# Patient Record
Sex: Female | Born: 1995 | Race: Black or African American | Hispanic: No | Marital: Single | Smoking: Never smoker
Health system: Southern US, Community
[De-identification: ages and names within clinical notes are randomized; demographics above are authoritative.]

## PROBLEM LIST (undated history)

## (undated) DIAGNOSIS — R519 Headache, unspecified: Secondary | ICD-10-CM

## (undated) DIAGNOSIS — Z9889 Other specified postprocedural states: Secondary | ICD-10-CM

## (undated) DIAGNOSIS — O4290 Premature rupture of membranes, unspecified as to length of time between rupture and onset of labor, unspecified weeks of gestation: Secondary | ICD-10-CM

## (undated) DIAGNOSIS — O99210 Obesity complicating pregnancy, unspecified trimester: Secondary | ICD-10-CM

## (undated) DIAGNOSIS — R112 Nausea with vomiting, unspecified: Secondary | ICD-10-CM

## (undated) DIAGNOSIS — D649 Anemia, unspecified: Secondary | ICD-10-CM

## (undated) DIAGNOSIS — R7303 Prediabetes: Secondary | ICD-10-CM

## (undated) DIAGNOSIS — F32A Depression, unspecified: Secondary | ICD-10-CM

## (undated) DIAGNOSIS — F419 Anxiety disorder, unspecified: Secondary | ICD-10-CM

## (undated) DIAGNOSIS — Z789 Other specified health status: Secondary | ICD-10-CM

## (undated) DIAGNOSIS — O26899 Other specified pregnancy related conditions, unspecified trimester: Secondary | ICD-10-CM

## (undated) HISTORY — PX: OTHER SURGICAL HISTORY: SHX169

## (undated) HISTORY — DX: Other specified health status: Z78.9

## (undated) HISTORY — PX: WISDOM TOOTH EXTRACTION: SHX21

## (undated) HISTORY — PX: ROOT CANAL: SHX2363

---
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
## 2017-09-04 DIAGNOSIS — N760 Acute vaginitis: Secondary | ICD-10-CM | POA: Insufficient documentation

## 2017-12-01 DIAGNOSIS — B3731 Acute candidiasis of vulva and vagina: Secondary | ICD-10-CM | POA: Insufficient documentation

## 2017-12-18 DIAGNOSIS — Z202 Contact with and (suspected) exposure to infections with a predominantly sexual mode of transmission: Secondary | ICD-10-CM | POA: Insufficient documentation

## 2019-09-19 ENCOUNTER — Encounter: Payer: Self-pay | Admitting: Certified Nurse Midwife

## 2019-09-19 ENCOUNTER — Ambulatory Visit: Payer: BC Managed Care – PPO | Admitting: Certified Nurse Midwife

## 2019-09-19 ENCOUNTER — Other Ambulatory Visit: Payer: Self-pay

## 2019-09-19 VITALS — BP 123/81 | HR 85 | Ht 64.0 in | Wt 224.3 lb

## 2019-09-19 DIAGNOSIS — Z6838 Body mass index (BMI) 38.0-38.9, adult: Secondary | ICD-10-CM

## 2019-09-19 DIAGNOSIS — Z3141 Encounter for fertility testing: Secondary | ICD-10-CM | POA: Diagnosis not present

## 2019-09-19 NOTE — Patient Instructions (Addendum)
Exercising to Stay Healthy To become healthy and stay healthy, it is recommended that you do moderate-intensity and vigorous-intensity exercise. You can tell that you are exercising at a moderate intensity if your heart starts beating faster and you start breathing faster but can still hold a conversation. You can tell that you are exercising at a vigorous intensity if you are breathing much harder and faster and cannot hold a conversation while exercising. Exercising regularly is important. It has many health benefits, such as:  Improving overall fitness, flexibility, and endurance.  Increasing bone density.  Helping with weight control.  Decreasing body fat.  Increasing muscle strength.  Reducing stress and tension.  Improving overall health. How often should I exercise? Choose an activity that you enjoy, and set realistic goals. Your health care provider can help you make an activity plan that works for you. Exercise regularly as told by your health care provider. This may include:  Doing strength training two times a week, such as: ? Lifting weights. ? Using resistance bands. ? Push-ups. ? Sit-ups. ? Yoga.  Doing a certain intensity of exercise for a given amount of time. Choose from these options: ? A total of 150 minutes of moderate-intensity exercise every week. ? A total of 75 minutes of vigorous-intensity exercise every week. ? A mix of moderate-intensity and vigorous-intensity exercise every week. Children, pregnant women, people who have not exercised regularly, people who are overweight, and older adults may need to talk with a health care provider about what activities are safe to do. If you have a medical condition, be sure to talk with your health care provider before you start a new exercise program. What are some exercise ideas? Moderate-intensity exercise ideas include:  Walking 1 mile (1.6 km) in about 15  minutes.  Biking.  Hiking.  Golfing.  Dancing.  Water aerobics. Vigorous-intensity exercise ideas include:  Walking 4.5 miles (7.2 km) or more in about 1 hour.  Jogging or running 5 miles (8 km) in about 1 hour.  Biking 10 miles (16.1 km) or more in about 1 hour.  Lap swimming.  Roller-skating or in-line skating.  Cross-country skiing.  Vigorous competitive sports, such as football, basketball, and soccer.  Jumping rope.  Aerobic dancing. What are some everyday activities that can help me to get exercise?  Yard work, such as: ? Pushing a lawn mower. ? Raking and bagging leaves.  Washing your car.  Pushing a stroller.  Shoveling snow.  Gardening.  Washing windows or floors. How can I be more active in my day-to-day activities?  Use stairs instead of an elevator.  Take a walk during your lunch break.  If you drive, park your car farther away from your work or school.  If you take public transportation, get off one stop early and walk the rest of the way.  Stand up or walk around during all of your indoor phone calls.  Get up, stretch, and walk around every 30 minutes throughout the day.  Enjoy exercise with a friend. Support to continue exercising will help you keep a regular routine of activity. What guidelines can I follow while exercising?  Before you start a new exercise program, talk with your health care provider.  Do not exercise so much that you hurt yourself, feel dizzy, or get very short of breath.  Wear comfortable clothes and wear shoes with good support.  Drink plenty of water while you exercise to prevent dehydration or heat stroke.  Work out until your breathing   and your heartbeat get faster. Where to find more information  U.S. Department of Health and Human Services: [employer].at  Centers for Disease Control and Prevention (CDC): http://www.wolf.info/ Summary  Exercising regularly is important. It will improve your overall fitness,  flexibility, and endurance.  Regular exercise also will improve your overall health. It can help you control your weight, reduce stress, and improve your bone density.  Do not exercise so much that you hurt yourself, feel dizzy, or get very short of breath.  Before you start a new exercise program, talk with your health care provider. This information is not intended to replace advice given to you by your health care provider. Make sure you discuss any questions you have with your health care provider. Document Revised: 03/23/2017 Document Reviewed: 03/01/2017 Elsevier Patient Education  2020 [employer].   Preparing for Pregnancy If you are considering becoming pregnant, make an appointment to see your regular health care provider to learn how to prepare for a safe and healthy pregnancy (preconception care). During a preconception care visit, your health care provider will:  Do a complete physical exam, including a Pap test.  Take a complete medical history.  Give you information, answer your questions, and help you resolve problems. Preconception checklist Medical history  Tell your health care provider about any current or past medical conditions. Your pregnancy or your ability to become pregnant may be affected by chronic conditions, such as diabetes, chronic hypertension, and thyroid problems.  Include your family's medical history as well as your partner's medical history.  Tell your health care provider about any history of STIs (sexually transmitted infections).These can affect your pregnancy. In some cases, they can be passed to your baby. Discuss any concerns that you have about STIs.  If indicated, discuss the benefits of genetic testing. This testing will show whether there are any genetic conditions that may be passed from you or your partner to your baby.  Tell your health care provider about: ? Any problems you have had with conception or pregnancy. ? Any medicines  you take. These include vitamins, herbal supplements, and over-the-counter medicines. ? Your history of immunizations. Discuss any vaccinations that you may need. Diet  Ask your health care provider what to include in a healthy diet that has a balance of nutrients. This is especially important when you are pregnant or preparing to become pregnant.  Ask your health care provider to help you reach a healthy weight before pregnancy. ? If you are overweight, you may be at higher risk for certain complications, such as high blood pressure, diabetes, and preterm birth. ? If you are underweight, you are more likely to have a baby who has a low birth weight. Lifestyle, work, and home  Let your health care provider know: ? About any lifestyle habits that you have, such as alcohol use, drug use, or smoking. ? About recreational activities that may put you at risk during pregnancy, such as downhill skiing and certain exercise programs. ? Tell your health care provider about any international travel, especially any travel to places with an active [place] virus outbreak. ? About harmful substances that you may be exposed to at work or at home. These include chemicals, pesticides, radiation, or even litter boxes. ? If you do not feel safe at home. Mental health  Tell your health care provider about: ? Any history of mental health conditions, including feelings of depression, sadness, or anxiety. ? Any medicines that you take for a mental health condition.  These include herbs and supplements. Home instructions to prepare for pregnancy Lifestyle   Eat a balanced diet. This includes fresh fruits and vegetables, whole grains, lean meats, low-fat dairy products, healthy fats, and foods that are high in fiber. Ask to meet with a nutritionist or registered dietitian for assistance with meal planning and goals.  Get regular exercise. Try to be active for at least 30 minutes a day on most days of the week. Ask  your health care provider which activities are safe during pregnancy.  Do not use any products that contain nicotine or tobacco, such as cigarettes and e-cigarettes. If you need help quitting, ask your health care provider.  Do not drink alcohol.  Do not take illegal drugs.  Maintain a healthy weight. Ask your health care provider what weight range is right for you. General instructions  Keep an accurate record of your menstrual periods. This makes it easier for your health care provider to determine your baby's due date.  Begin taking prenatal vitamins and folic acid supplements daily as directed by your health care provider.  Manage any chronic conditions, such as high blood pressure and diabetes, as told by your health care provider. This is important. How do I know that I am pregnant? You may be pregnant if you have been sexually active and you miss your period. Symptoms of early pregnancy include:  Mild cramping.  Very light vaginal bleeding (spotting).  Feeling unusually tired.  Nausea and vomiting (morning sickness). If you have any of these symptoms and you suspect that you might be pregnant, you can take a home pregnancy test. These tests check for a hormone in your urine (human chorionic gonadotropin, or hCG). A woman's body begins to make this hormone during early pregnancy. These tests are very accurate. Wait until at least the first day after you miss your period to take one. If the test shows that you are pregnant (you get a positive result), call your health care provider to make an appointment for prenatal care. What should I do if I become pregnant?      Make an appointment with your health care provider as soon as you suspect you are pregnant.  Do not use any products that contain nicotine, such as cigarettes, chewing tobacco, and e-cigarettes. If you need help quitting, ask your health care provider.  Do not drink alcoholic beverages. Alcohol is related to a  number of birth defects.  Avoid toxic odors and chemicals.  You may continue to have sexual intercourse if it does not cause pain or other problems, such as vaginal bleeding. This information is not intended to replace advice given to you by your health care provider. Make sure you discuss any questions you have with your health care provider. Document Revised: 04/12/2017 Document Reviewed: 10/31/2015 Elsevier Patient Education  2020 [place] Following a healthy eating pattern may help you to achieve and maintain a healthy body weight, reduce the risk of chronic disease, and live a long and productive life. It is important to follow a healthy eating pattern at an appropriate calorie level for your body. Your nutritional needs should be met primarily through food by choosing a variety of nutrient-rich foods. What are tips for following this plan? Reading food labels  Read labels and choose the following: ? Reduced or low sodium. ? Juices with 100% fruit juice. ? Foods with low saturated fats and high polyunsaturated and monounsaturated fats. ? Foods with whole grains, such as  whole wheat, cracked wheat, brown rice, and wild rice. ? Whole grains that are fortified with folic acid. This is recommended for women who are pregnant or who want to become pregnant.  Read labels and avoid the following: ? Foods with a lot of added sugars. These include foods that contain brown sugar, corn sweetener, corn syrup, dextrose, fructose, glucose, high-fructose corn syrup, honey, invert sugar, lactose, malt syrup, maltose, molasses, raw sugar, sucrose, trehalose, or turbinado sugar.  Do not eat more than the following amounts of added sugar per day:  6 teaspoons (25 g) for women.  9 teaspoons (38 g) for men. ? Foods that contain processed or refined starches and grains. ? Refined grain products, such as white flour, degermed cornmeal, white bread, and white  rice. Shopping  Choose nutrient-rich snacks, such as vegetables, whole fruits, and nuts. Avoid high-calorie and high-sugar snacks, such as potato chips, fruit snacks, and candy.  Use oil-based dressings and spreads on foods instead of solid fats such as butter, stick margarine, or cream cheese.  Limit pre-made sauces, mixes, and "instant" products such as flavored rice, instant noodles, and ready-made pasta.  Try more plant-protein sources, such as tofu, tempeh, black beans, edamame, lentils, nuts, and seeds.  Explore eating plans such as the Mediterranean diet or vegetarian diet. Cooking  Use oil to saut or stir-fry foods instead of solid fats such as butter, stick margarine, or lard.  Try baking, boiling, grilling, or broiling instead of frying.  Remove the fatty part of meats before cooking.  Steam vegetables in water or broth. Meal planning   At meals, imagine dividing your plate into fourths: ? One-half of your plate is fruits and vegetables. ? One-fourth of your plate is whole grains. ? One-fourth of your plate is protein, especially lean meats, poultry, eggs, tofu, beans, or nuts.  Include low-fat dairy as part of your daily diet. Lifestyle  Choose healthy options in all settings, including home, work, school, restaurants, or stores.  Prepare your food safely: ? Wash your hands after handling raw meats. ? Keep food preparation surfaces clean by regularly washing with hot, soapy water. ? Keep raw meats separate from ready-to-eat foods, such as fruits and vegetables. ? Cook seafood, meat, poultry, and eggs to the recommended internal temperature. ? Store foods at safe temperatures. In general:  Keep cold foods at 27F (4.4C) or below.  Keep hot foods at 127F (60C) or above.  Keep your freezer at [employer] (-17.8C) or below.  Foods are no longer safe to eat when they have been between the temperatures of 40-127F (4.4-60C) for more than 2 hours. What foods should  I eat? Fruits Aim to eat 2 cup-equivalents of fresh, canned (in natural juice), or frozen fruits each day. Examples of 1 cup-equivalent of fruit include 1 small apple, 8 large strawberries, 1 cup canned fruit,  cup dried fruit, or 1 cup 100% juice. Vegetables Aim to eat 2-3 cup-equivalents of fresh and frozen vegetables each day, including different varieties and colors. Examples of 1 cup-equivalent of vegetables include 2 medium carrots, 2 cups raw, leafy greens, 1 cup chopped vegetable (raw or cooked), or 1 medium baked potato. Grains Aim to eat 6 ounce-equivalents of whole grains each day. Examples of 1 ounce-equivalent of grains include 1 slice of bread, 1 cup ready-to-eat cereal, 3 cups popcorn, or  cup cooked rice, pasta, or cereal. Meats and other proteins Aim to eat 5-6 ounce-equivalents of protein each day. Examples of 1 ounce-equivalent of protein include 1  egg, 1/2 cup nuts or seeds, or 1 tablespoon (16 g) peanut butter. A cut of meat or fish that is the size of a deck of cards is about 3-4 ounce-equivalents.  Of the protein you eat each week, try to have at least 8 ounces come from seafood. This includes salmon, trout, herring, and anchovies. Dairy Aim to eat 3 cup-equivalents of fat-free or low-fat dairy each day. Examples of 1 cup-equivalent of dairy include 1 cup (240 mL) milk, 8 ounces (250 g) yogurt, 1 ounces (44 g) natural cheese, or 1 cup (240 mL) fortified soy milk. Fats and oils  Aim for about 5 teaspoons (21 g) per day. Choose monounsaturated fats, such as canola and olive oils, avocados, peanut butter, and most nuts, or polyunsaturated fats, such as sunflower, corn, and soybean oils, walnuts, pine nuts, sesame seeds, sunflower seeds, and flaxseed. Beverages  Aim for six 8-oz glasses of water per day. Limit coffee to three to five 8-oz cups per day.  Limit caffeinated beverages that have added calories, such as soda and energy drinks.  Limit alcohol intake to no more  than 1 drink a day for nonpregnant women and 2 drinks a day for men. One drink equals 12 oz of beer (355 mL), 5 oz of wine (148 mL), or 1 oz of hard liquor (44 mL). Seasoning and other foods  Avoid adding excess amounts of salt to your foods. Try flavoring foods with herbs and spices instead of salt.  Avoid adding sugar to foods.  Try using oil-based dressings, sauces, and spreads instead of solid fats. This information is based on general U.[place] nutrition guidelines. For more information, visit [employer].es. Exact amounts may vary based on your nutrition needs. Summary  A healthy eating plan may help you to maintain a healthy weight, reduce the risk of chronic diseases, and stay active throughout your life.  Plan your meals. Make sure you eat the right portions of a variety of nutrient-rich foods.  Try baking, boiling, grilling, or broiling instead of frying.  Choose healthy options in all settings, including home, work, school, restaurants, or stores. This information is not intended to replace advice given to you by your health care provider. Make sure you discuss any questions you have with your health care provider. Document Revised: 07/23/2017 Document Reviewed: 07/23/2017 Elsevier Patient Education  [place].

## 2019-09-19 NOTE — Progress Notes (Signed)
I spent 20 minutes dedicated to the care of this patient on the date of this encounter to include pre-visit review of records, face to face time with the patient, and post visit ordering of testing.   I have seen, interviewed, and examined the patient in conjunction with the [employer] Nurse Practitioner student and affirm the diagnosis and management plan.   Gunnar Bulla, CNM [facility], [facility] 09/19/19 11:23 AM

## 2019-09-19 NOTE — Progress Notes (Addendum)
GYN ENCOUNTER NOTE  Subjective:       Brenda Mccoy is a 24 y.o. G0P0000 female is here for to establish care and discuss fertility concerns.   Previously discussed this with PCP and was told to try for a year then come back.    Periods are regular, every 29 days lasting for 6-7 days.   Uses period tracker to track cycles.   Reports has been trying for one year.  Currently having un-protected intercourse and has not gotten pregnant.   Denies difficulty breathing or respiratory distress, chest pain, abdominal pain, excessive vaginal bleeding, dysuria, leg pain or swelling.  Gynecologic History  Patient's last menstrual period was 09/05/2019.   Contraception: none, Trying to Conceive.    Period Duration (Days): 6-7 Period Pattern: Regular Menstrual Flow: Moderate Menstrual Control: Maxi pad Menstrual Control Change Freq (Hours): 8 Dysmenorrhea: (!) Moderate Dysmenorrhea Symptoms: Cramping  Obstetric History  OB History  Gravida Para Term Preterm AB Living  0 0 0 0 0 0  SAB TAB Ectopic Multiple Live Births  0 0 0 0 0    Past Medical History:  Diagnosis Date  . No pertinent past medical history     Past Surgical History:  Procedure Laterality Date  . no surgical history     No Known Allergies  Social History   Socioeconomic History  . Marital status: Single    Spouse name: Not on file  . Number of children: Not on file  . Years of education: Not on file  . Highest education level: Not on file  Occupational History  . Not on file  Tobacco Use  . Smoking status: Never Smoker  . Smokeless tobacco: Never Used  Substance and Sexual Activity  . Alcohol use: Yes  . Drug use: Not Currently  . Sexual activity: Yes    Birth control/protection: None  Other Topics Concern  . Not on file  Social History Narrative  . Not on file   Social Determinants of Health   Financial Resource Strain:   . Difficulty of Paying Living Expenses:   Food Insecurity:   .  Worried About Charity fundraiser in the Last Year:   . Arboriculturist in the Last Year:   Transportation Needs:   . Film/video editor (Medical):   [name] Lack of Transportation (Non-Medical):   Physical Activity:   . Days of Exercise per Week:   . Minutes of Exercise per Session:   Stress:   . Feeling of Stress :   Social Connections:   . Frequency of Communication with Friends and Family:   . Frequency of Social Gatherings with Friends and Family:   . Attends Religious Services:   . Active Member of Clubs or Organizations:   . Attends Archivist Meetings:   [name] Marital Status:   Intimate Partner Violence:   . Fear of Current or Ex-Partner:   . Emotionally Abused:   [name] Physically Abused:   . Sexually Abused:     Family History  Problem Relation Age of Onset  . Hypertension Mother   . Healthy Maternal Grandmother   . Healthy Maternal Grandfather     The following portions of the patient's history were reviewed and updated as appropriate: allergies, current medications, past family history, past medical history, past social history, past surgical history and problem list.  Review of Systems  ROS negative except as noted above. Information obtained from patient.   Objective:   BP 123/81  Pulse 85   Ht 5\' 4"  (1.626 m)   Wt 224 lb 4.8 oz (101.7 kg)   LMP 09/05/2019   BMI 38.50 kg/m    CONSTITUTIONAL: Well-developed, well-nourished female in no acute distress.   PHYSICAL EXAM: Not Indicated at this time.   Assessment:   1. BMI 38.0-38.9,adult  - TSH - FSH/LH - Anti-Mullerian Hormone [facility]), Female  2. Fertility testing  - TSH - FSH/LH - Anti-Mullerian Hormone [facility]), Female     Plan:   Labs done: See Orders  Discussed importance of well-timed intercourse.  Recommended use of ovulation strips.  Provided with samples of prenatal vitamins and water based lubricants.   Reviewed red flags and when to call the clinic.  RTC x 2-3 weeks for  ANNUAL EXAM with PAP or sooner if needed.   [facility] RN [facility] [employer] 09/19/19 10:17 AM

## 2019-09-19 NOTE — Progress Notes (Signed)
New pt to est care. Pt present today due to having fertility concerns and issues. Pt 's LMP 09/05/2019. Pt stated that she has been trying to get pregnant for a year. Pt's cycles are regular and comes every month as scheduled.

## 2019-09-25 LAB — TSH: TSH: 3.24 u[IU]/mL (ref 0.450–4.500)

## 2019-09-25 LAB — FSH/LH
FSH: 8.3 m[IU]/mL
LH: 16.3 m[IU]/mL

## 2019-09-25 LAB — ANTI MULLERIAN HORMONE: ANTI-MULLERIAN HORMONE (AMH): 1.37 ng/mL

## 2019-10-13 ENCOUNTER — Other Ambulatory Visit (HOSPITAL_COMMUNITY)
Admission: RE | Admit: 2019-10-13 | Discharge: 2019-10-13 | Disposition: A | Discharge status: Home / Self Care | Payer: BC Managed Care – PPO | Source: Ambulatory Visit | Attending: Certified Nurse Midwife | Admitting: Certified Nurse Midwife

## 2019-10-13 ENCOUNTER — Ambulatory Visit (INDEPENDENT_AMBULATORY_CARE_PROVIDER_SITE_OTHER): Payer: BC Managed Care – PPO | Admitting: Certified Nurse Midwife

## 2019-10-13 ENCOUNTER — Encounter: Payer: Self-pay | Admitting: Certified Nurse Midwife

## 2019-10-13 ENCOUNTER — Other Ambulatory Visit: Payer: Self-pay

## 2019-10-13 VITALS — BP 109/64 | HR 84 | Ht 64.0 in | Wt 224.4 lb

## 2019-10-13 DIAGNOSIS — Z3141 Encounter for fertility testing: Secondary | ICD-10-CM

## 2019-10-13 DIAGNOSIS — Z124 Encounter for screening for malignant neoplasm of cervix: Secondary | ICD-10-CM

## 2019-10-13 DIAGNOSIS — Z01419 Encounter for gynecological examination (general) (routine) without abnormal findings: Secondary | ICD-10-CM | POA: Diagnosis not present

## 2019-10-13 DIAGNOSIS — Z6838 Body mass index (BMI) 38.0-38.9, adult: Secondary | ICD-10-CM

## 2019-10-13 NOTE — Patient Instructions (Signed)
Preventive Care 21-22 Years Old, Female Preventive care refers to visits with your health care provider and lifestyle choices that can promote health and wellness. This includes:  A yearly physical exam. This may also be called an annual well check.  Regular dental visits and eye exams.  Immunizations.  Screening for certain conditions.  Healthy lifestyle choices, such as eating a healthy diet, getting regular exercise, not using drugs or products that contain nicotine and tobacco, and limiting alcohol use. What can I expect for my preventive care visit? Physical exam Your health care provider will check your:  Height and weight. This may be used to calculate body mass index (BMI), which tells if you are at a healthy weight.  Heart rate and blood pressure.  Skin for abnormal spots. Counseling Your health care provider may ask you questions about your:  Alcohol, tobacco, and drug use.  Emotional well-being.  Home and relationship well-being.  Sexual activity.  Eating habits.  Work and work environment.  Method of birth control.  Menstrual cycle.  Pregnancy history. What immunizations do I need?  Influenza (flu) vaccine  This is recommended every year. Tetanus, diphtheria, and pertussis (Tdap) vaccine  You may need a Td booster every 10 years. Varicella (chickenpox) vaccine  You may need this if you have not been vaccinated. Human papillomavirus (HPV) vaccine  If recommended by your health care provider, you may need three doses over 6 months. Measles, mumps, and rubella (MMR) vaccine  You may need at least one dose of MMR. You may also need a second dose. Meningococcal conjugate (MenACWY) vaccine  One dose is recommended if you are age 19-21 years and a first-year college student living in a residence hall, or if you have one of several medical conditions. You may also need additional booster doses. Pneumococcal conjugate (PCV13) vaccine  You may need  this if you have certain conditions and were not previously vaccinated. Pneumococcal polysaccharide (PPSV23) vaccine  You may need one or two doses if you smoke cigarettes or if you have certain conditions. Hepatitis A vaccine  You may need this if you have certain conditions or if you travel or work in places where you may be exposed to hepatitis A. Hepatitis B vaccine  You may need this if you have certain conditions or if you travel or work in places where you may be exposed to hepatitis B. Haemophilus influenzae type b (Hib) vaccine  You may need this if you have certain conditions. You may receive vaccines as individual doses or as more than one vaccine together in one shot (combination vaccines). Talk with your health care provider about the risks and benefits of combination vaccines. What tests do I need?  Blood tests  Lipid and cholesterol levels. These may be checked every 5 years starting at age 20.  Hepatitis C test.  Hepatitis B test. Screening  Diabetes screening. This is done by checking your blood sugar (glucose) after you have not eaten for a while (fasting).  Sexually transmitted disease (STD) testing.  BRCA-related cancer screening. This may be done if you have a family history of breast, ovarian, tubal, or peritoneal cancers.  Pelvic exam and Pap test. This may be done every 3 years starting at age 21. Starting at age 30, this may be done every 5 years if you have a Pap test in combination with an HPV test. Talk with your health care provider about your test results, treatment options, and if necessary, the need for more tests.   Follow these instructions at home: Eating and drinking   Eat a diet that includes fresh fruits and vegetables, whole grains, lean protein, and low-fat dairy.  Take vitamin and mineral supplements as recommended by your health care provider.  Do not drink alcohol if: ? Your health care provider tells you not to drink. ? You are  pregnant, may be pregnant, or are planning to become pregnant.  If you drink alcohol: ? Limit how much you have to 0-1 drink a day. ? Be aware of how much alcohol is in your drink. In the [place] one drink equals one 12 oz bottle of beer (355 mL), one 5 oz glass of wine (148 mL), or one 1 oz glass of hard liquor (44 mL). Lifestyle  Take daily care of your teeth and gums.  Stay active. Exercise for at least 30 minutes on 5 or more days each week.  Do not use any products that contain nicotine or tobacco, such as cigarettes, e-cigarettes, and chewing tobacco. If you need help quitting, ask your health care provider.  If you are sexually active, practice safe sex. Use a condom or other form of birth control (contraception) in order to prevent pregnancy and STIs (sexually transmitted infections). If you plan to become pregnant, see your health care provider for a preconception visit. What's next?  Visit your health care provider once a year for a well check visit.  Ask your health care provider how often you should have your eyes and teeth checked.  Stay up to date on all vaccines. This information is not intended to replace advice given to you by your health care provider. Make sure you discuss any questions you have with your health care provider. Document Revised: 12/20/2017 Document Reviewed: 12/20/2017 Elsevier Patient Education  2020 [employer].

## 2019-10-13 NOTE — Progress Notes (Signed)
I have seen, interviewed, and examined the patient in conjunction with the [employer] Nurse Practitioner student and affirm the diagnosis and management plan.   Gunnar Bulla, CNM [facility], [facility] 10/13/19 3:04 PM

## 2019-10-13 NOTE — Progress Notes (Signed)
ANNUAL PREVENTATIVE CARE GYN  ENCOUNTER NOTE  Subjective:       Brenda Mccoy is a 24 y.o. G0P0000 female here for a routine annual gynecologic exam.  Current complaints: 1.  "Knot to left neck" 2.  Review of labs  Doing well, would like to discuss lab results from last visit.   Noticed "knot to left side of neck" one day ago; no recent sickness but has been working a lot and not sleeping well.  Denies difficulty breathing or respiratory distress, chest pain, abdominal pain, excessive vaginal bleeding, dysuria, leg pain or swelling   Gynecologic History  Patient's last menstrual period was 10/02/2019.  Period Cycle (Days): 28 Period Duration (Days): 6 Period Pattern: Regular Menstrual Flow: Light, Moderate Menstrual Control: Maxi pad Menstrual Control Change Freq (Hours): 8 Dysmenorrhea: (!) Mild Dysmenorrhea Symptoms: Cramping  Contraception: none   Last Pap: today, see chart  Obstetric History  OB History  Gravida Para Term Preterm AB Living  0 0 0 0 0 0  SAB TAB Ectopic Multiple Live Births  0 0 0 0 0    Past Medical History:  Diagnosis Date  . No pertinent past medical history     Past Surgical History:  Procedure Laterality Date  . no surgical history      Current Outpatient Medications on File Prior to Visit  Medication Sig Dispense Refill  . metroNIDAZOLE (FLAGYL) 500 MG tablet Take by mouth.     No current facility-administered medications on file prior to visit.    No Known Allergies  Social History   Socioeconomic History  . Marital status: Single    Spouse name: Not on file  . Number of children: Not on file  . Years of education: Not on file  . Highest education level: Not on file  Occupational History  . Not on file  Tobacco Use  . Smoking status: Never Smoker  . Smokeless tobacco: Never Used  Vaping Use  . Vaping Use: Former  Substance and Sexual Activity  . Alcohol use: Yes  . Drug use: Not Currently  . Sexual activity: Yes     Birth control/protection: None  Other Topics Concern  . Not on file  Social History Narrative  . Not on file   Social Determinants of Health   Financial Resource Strain:   . Difficulty of Paying Living Expenses:   Food Insecurity:   . Worried About Charity fundraiser in the Last Year:   . Arboriculturist in the Last Year:   Transportation Needs:   . Film/video editor (Medical):   [name] Lack of Transportation (Non-Medical):   Physical Activity:   . Days of Exercise per Week:   . Minutes of Exercise per Session:   Stress:   . Feeling of Stress :   Social Connections:   . Frequency of Communication with Friends and Family:   . Frequency of Social Gatherings with Friends and Family:   . Attends Religious Services:   . Active Member of Clubs or Organizations:   . Attends Archivist Meetings:   [name] Marital Status:   Intimate Partner Violence:   . Fear of Current or Ex-Partner:   . Emotionally Abused:   [name] Physically Abused:   . Sexually Abused:     Family History  Problem Relation Age of Onset  . Hypertension Mother   . Healthy Maternal Grandmother   . Healthy Maternal Grandfather     The following portions of the patient's  history were reviewed and updated as appropriate: allergies, current medications, past family history, past medical history, past social history, past surgical history and problem list.  Review of Systems  ROS- negative except as noted above. Information obtained from patient.   Objective:   BP 109/64   Pulse 84   Ht 5\' 4"  (1.626 m)   Wt 224 lb 6.4 oz (101.8 kg)   LMP 10/02/2019   BMI 38.52 kg/m    CONSTITUTIONAL: Well-developed, well-nourished female in no acute distress.   PSYCHIATRIC: Normal mood and affect. Normal behavior. Normal judgment and thought content.  NEUROLGIC: Alert and oriented to person, place, and time. Normal muscle tone coordination. No cranial nerve deficit noted.  HENT:  Normocephalic, atraumatic,  External right and left ear normal. Oropharynx is clear and moist  EYES: Conjunctivae and EOM are normal. No scleral icterus.   NECK: Normal range of motion, supple, no masses.  Normal thyroid.   SKIN: Skin is warm and dry. No rash noted. Not diaphoretic. No erythema. No pallor.  CARDIOVASCULAR: Normal heart rate noted, regular rhythm, no murmur.  RESPIRATORY: Clear to auscultation bilaterally. Effort and breath sounds normal, no problems with respiration noted.  BREASTS: Symmetric in size. No masses, skin changes, nipple drainage, or lymphadenopathy.  ABDOMEN: Soft, normal bowel sounds, no distention noted.  No tenderness, rebound or guarding.   PELVIC:  External Genitalia: Normal  Vagina: Normal  Cervix: Normal, PAP collectd  Uterus: Normal  Adnexa: Normal   MUSCULOSKELETAL: Normal range of motion. No tenderness.  No cyanosis, clubbing, or edema.  2+ distal pulses.  LYMPHATIC: No Axillary or Inguinal Adenopathy. Left sided supraclavicular adenopathy size of a penny.  Assessment:   Annual gynecologic examination 24 y.o.   Contraception: none   Obesity 2   Problem List Items Addressed This Visit    None    Visit Diagnoses    Well woman exam with routine gynecological exam    -  Primary   BMI 38.0-38.9,adult          Plan:   Pap: Pap Co Test   Ultrasound ordered: See orders   Discussed use of ovulation test strips and timed intercourse.  Routine preventative health maintenance measures emphasized: Exercise/Diet/Weight control, Tobacco Warnings, Alcohol/Substance use risks, Stress Management, Peer Pressure Issues and Safe Sex See AVS.  Reviewed red flags and when to call the office.   RTC x 1 year for ANNUAL EXAM or sooner if needed.   05-06-1974 RN [facility] [employer] 10/13/19 1:46 PM

## 2019-10-13 NOTE — Progress Notes (Signed)
Patient comes in today for a physical. She is currently on Flaygl for BV. Patient has a knot on left side of her neck under the skin.

## 2019-10-16 LAB — CYTOLOGY - PAP
Adequacy: ABSENT
Chlamydia: NEGATIVE
Comment: NEGATIVE
Comment: NORMAL
Diagnosis: NEGATIVE
Neisseria Gonorrhea: NEGATIVE

## 2019-11-11 ENCOUNTER — Other Ambulatory Visit: Payer: Self-pay

## 2019-11-11 ENCOUNTER — Ambulatory Visit (INDEPENDENT_AMBULATORY_CARE_PROVIDER_SITE_OTHER): Payer: BC Managed Care – PPO

## 2019-11-11 DIAGNOSIS — Z3141 Encounter for fertility testing: Secondary | ICD-10-CM | POA: Diagnosis not present

## 2019-11-11 DIAGNOSIS — Z01419 Encounter for gynecological examination (general) (routine) without abnormal findings: Secondary | ICD-10-CM

## 2020-01-12 ENCOUNTER — Encounter: Payer: Self-pay | Admitting: Certified Nurse Midwife

## 2020-01-12 ENCOUNTER — Other Ambulatory Visit: Payer: Self-pay

## 2020-01-12 ENCOUNTER — Ambulatory Visit (INDEPENDENT_AMBULATORY_CARE_PROVIDER_SITE_OTHER): Payer: BC Managed Care – PPO | Admitting: Certified Nurse Midwife

## 2020-01-12 VITALS — BP 113/74 | HR 91 | Ht 64.0 in | Wt 230.4 lb

## 2020-01-12 DIAGNOSIS — O210 Mild hyperemesis gravidarum: Secondary | ICD-10-CM | POA: Diagnosis not present

## 2020-01-12 DIAGNOSIS — N926 Irregular menstruation, unspecified: Secondary | ICD-10-CM | POA: Diagnosis not present

## 2020-01-12 DIAGNOSIS — Z3201 Encounter for pregnancy test, result positive: Secondary | ICD-10-CM

## 2020-01-12 LAB — POCT URINE PREGNANCY: Preg Test, Ur: POSITIVE — AB

## 2020-01-12 NOTE — Patient Instructions (Addendum)
Healthy Weight Gain During Pregnancy, Adult A certain amount of weight gain during pregnancy is normal and healthy. How much weight you should gain depends on your overall health and a measurement called BMI (body mass index). BMI is an estimate of your body fat based on your height and weight. You can use an online calculator to figure out your BMI, or you can ask your health care provider to calculate it for you at your next visit. Your recommended pregnancy weight gain is based on your pre-pregnancy BMI. General guidelines for a healthy total weight gain during pregnancy are listed below. If your BMI at or before the start of your pregnancy is:  Less than 18.5 (underweight), you should gain 28-40 lb (13-18 kg).  18.5-24.9 (normal weight), you should gain 25-35 lb (11-16 kg).  25-29.9 (overweight), you should gain 15-25 lb (7-11 kg).  30 or higher (obese), you should gain 11-20 lb (5-9 kg). These ranges vary depending on your individual health. If you are carrying more than one baby (multiples), it may be safe to gain more weight than these recommendations. If you gain less weight than recommended, that may be safe as long as your baby is growing and developing normally. How can unhealthy weight gain affect me and my baby? Gaining too much weight during pregnancy can lead to pregnancy complications, such as:  A temporary form of diabetes that develops during pregnancy (gestational diabetes).  High blood pressure during pregnancy and protein in your urine (preeclampsia).  High blood pressure during pregnancy without protein in your urine (gestational hypertension).  Your baby having a high weight at birth, which may: ? Raise your risk of having a more difficult delivery or a surgical delivery (cesarean delivery, or C-section). ? Raise your child's risk of developing obesity during childhood. Not gaining enough weight can be life-threatening for your baby, and it may raise your baby's chances  of:  Being born early (preterm).  Growing more slowly than normal during pregnancy (growth restriction).  Having a low weight at birth. What actions can I take to gain a healthy amount of weight during pregnancy? General instructions  Keep track of your weight gain during pregnancy.  Take over-the-counter and prescription medicines only as told by your health care provider. Take all prenatal supplements as directed.  Keep all health care visits during pregnancy (prenatal visits). These visits are a good time to discuss your weight gain. Your health care provider will weigh you at each visit to make sure you are gaining a healthy amount of weight. Nutrition   Eat a balanced, nutrient-rich diet. Eat plenty of: ? Fruits and vegetables, such as berries and broccoli. ? Whole grains, such as millet, barley, whole-wheat breads and cereals, and oatmeal. ? Low-fat dairy products or non-dairy products such as almond milk or rice milk. ? Protein foods, such as lean meat, chicken, eggs, and legumes (such as peas, beans, soybeans, and lentils).  Avoid foods that are fried or have a lot of fat, salt (sodium), or sugar.  Drink enough fluid to keep your urine pale yellow.  Choose healthy snack and drink options when you are at work or on the go: ? Drink water. Avoid soda, sports drinks, and juices that have added sugar. ? Avoid drinks with caffeine, such as coffee and energy drinks. ? Eat snacks that are high in protein, such as nuts, protein bars, and low-fat yogurt. ? Carry convenient snacks in your purse that do not need refrigeration, such as a pack of   trail mix, an apple, or a granola bar.  If you need help improving your diet, work with a health care provider or a diet and nutrition specialist (dietitian). Activity   Exercise regularly, as told by your health care provider. ? If you were active before becoming pregnant, you may be able to continue your regular fitness activities. ? If  you were not active before pregnancy, you may gradually build up to exercising for 30 or more minutes on most days of the week. This may include walking, swimming, or yoga.  Ask your health care provider what activities are safe for you. Talk with your health care provider about whether you may need to be excused from certain school or work activities. Where to find more information Learn more about managing your weight gain during pregnancy from:  American Pregnancy Association: www.americanpregnancy.org  [place] Department of Agriculture pregnancy weight gain calculator: [employer] Summary  Too much weight gain during pregnancy can lead to complications for you and your baby.  Find out your pre-pregnancy BMI to determine how much weight gain is healthy for you.  Eat nutritious foods and stay active.  Keep all of your prenatal visits as told by your health care provider. This information is not intended to replace advice given to you by your health care provider. Make sure you discuss any questions you have with your health care provider. Document Revised: 01/01/2019 Document Reviewed: 12/29/2016 Elsevier Patient Education  [place].   Exercise During Pregnancy Exercise is an important part of being healthy for people of all ages. Exercise improves the function of your heart and lungs and helps you maintain strength, flexibility, and a healthy body weight. Exercise also boosts energy levels and elevates mood. Most women should exercise regularly during pregnancy. In rare cases, women with certain medical conditions or complications may be asked to limit or avoid exercise during pregnancy. How does this affect me? Along with maintaining general strength and flexibility, exercising during pregnancy can help:  Keep strength in muscles that are used during labor and childbirth.  Decrease low back pain.  Reduce symptoms of depression.  Control weight gain during  pregnancy.  Reduce the risk of needing insulin if you develop diabetes during pregnancy.  Decrease the risk of cesarean delivery.  Speed up your recovery after giving birth. How does this affect my baby? Exercise can help you have a healthy pregnancy. Exercise does not cause premature birth. It will not cause your baby to weigh less at birth. What exercises can I do? Many exercises are safe for you to do during pregnancy. Do a variety of exercises that safely increase your heart and breathing rates and help you build and maintain muscle strength. Do exercises exactly as told by your health care provider. You may do these exercises:  Walking or hiking.  Swimming.  Water aerobics.  Riding a stationary bike.  Strength training.  Modified yoga or Pilates. Tell your instructor that you are pregnant. Avoid overstretching, and avoid lying on your back for long periods of time.  Running or jogging. Only choose this type of exercise if you: ? Ran or jogged regularly before your pregnancy. ? Can run or jog and still talk in complete sentences. What exercises should I avoid? Depending on your level of fitness and whether you exercised regularly before your pregnancy, you may be told to limit high-intensity exercise. You can tell that you are exercising at a high intensity if you are breathing much harder and faster and  cannot hold a conversation while exercising. You must avoid:  Contact sports.  Activities that put you at risk for falling on or being hit in the belly, such as downhill skiing, water skiing, surfing, rock climbing, cycling, gymnastics, and horseback riding.  Scuba diving.  Skydiving.  Yoga or Pilates in a room that is heated to high temperatures.  Jogging or running, unless you ran or jogged regularly before your pregnancy. While jogging or running, you should always be able to talk in full sentences. Do not run or jog so fast that you are unable to have a  conversation.  Do not exercise at more than 6,000 feet above sea level (high elevation) if you are not used to exercising at high elevation. How do I exercise in a safe way?   Avoid overheating. Do not exercise in very high temperatures.  Wear loose-fitting, breathable clothes.  Avoid dehydration. Drink enough water before, during, and after exercise to keep your urine pale yellow.  Avoid overstretching. Because of hormone changes during pregnancy, it is easy to overstretch muscles, tendons, and ligaments during pregnancy.  Start slowly and ask your health care provider to recommend the types of exercise that are safe for you.  Do not exercise to lose weight. Follow these instructions at home:  Exercise on most days or all days of the week. Try to exercise for 30 minutes a day, 5 days a week, unless your health care provider tells you not to.  If you actively exercised before your pregnancy and you are healthy, your health care provider may tell you to continue to do moderate to high-intensity exercise.  If you are just starting to exercise or did not exercise much before your pregnancy, your health care provider may tell you to do low to moderate-intensity exercise. Questions to ask your health care provider  Is exercise safe for me?  What are signs that I should stop exercising?  Does my health condition mean that I should not exercise during pregnancy?  When should I avoid exercising during pregnancy? Stop exercising and contact a health care provider if: You have any unusual symptoms, such as:  Mild contractions of the uterus or cramps in the abdomen.  Dizziness that does not go away when you rest. Stop exercising and get help right away if: You have any unusual symptoms, such as:  Sudden, severe pain in your low back or your belly.  Mild contractions of the uterus or cramps in the abdomen that do not improve with rest and drinking fluids.  Chest pain.  Bleeding or  fluid leaking from your vagina.  Shortness of breath. These symptoms may represent a serious problem that is an emergency. Do not wait to see if the symptoms will go away. Get medical help right away. Call your local emergency services (911 in the [place] Do not drive yourself to the hospital. Summary  Most women should exercise regularly throughout pregnancy. In rare cases, women with certain medical conditions or complications may be asked to limit or avoid exercise during pregnancy.  Do not exercise to lose weight during pregnancy.  Your health care provider will tell you what level of physical activity is right for you.  Stop exercising and contact a health care provider if you have mild contractions of the uterus or cramps in the abdomen. Get help right away if these contractions or cramps do not improve with rest and drinking fluids.  Stop exercising and get help right away if you have sudden, severe  pain in your low back or belly, chest pain, shortness of breath, or bleeding or leaking of fluid from your vagina. This information is not intended to replace advice given to you by your health care provider. Make sure you discuss any questions you have with your health care provider. Document Revised: 08/01/2018 Document Reviewed: 05/15/2018 Elsevier Patient Education  2020 [employer] Inc[employer]   Common Medications Safe in Pregnancy  Acne:      Constipation:  Benzoyl Peroxide     Colace  Clindamycin      Dulcolax Suppository  Topica Erythromycin     Fibercon  Salicylic Acid      Metamucil         Miralax AVOID:        Senakot   Accutane    Cough:  Retin-A       Cough Drops  Tetracycline      Phenergan w/ Codeine if Rx  Minocycline      Robitussin (Plain & DM)  Antibiotics:     Crabs/Lice:  Ceclor       RID  Cephalosporins    AVOID:  E-Mycins      Kwell  Keflex  Macrobid/Macrodantin   Diarrhea:  Penicillin      Kao-Pectate  Zithromax      Imodium AD         PUSH  FLUIDS AVOID:       Cipro     Fever:  Tetracycline      Tylenol (Regular or Extra  Minocycline       Strength)  Levaquin      Extra Strength-Do not          Exceed 8 tabs/24 hrs Caffeine:        <200mg/day (equiv. To 1 cup of coffee or  approx. 3 12 oz sodas)         Gas: Cold/Hayfever:       Gas-X  Benadryl      Mylicon  Claritin       Phazyme  **Claritin-D        Chlor-Trimeton    Headaches:  Dimetapp      ASA-Free Excedrin  Drixoral-Non-Drowsy     Cold Compress  Mucinex (Guaifenasin)     Tylenol (Regular or Extra  Sudafed/Sudafed-12 Hour     Strength)  **Sudafed PE Pseudoephedrine   Tylenol Cold & Sinus     Vicks Vapor Rub  Zyrtec  **AVOID if Problems With Blood Pressure         Heartburn: Avoid lying down for at least 1 hour after meals  Aciphex      Maalox     Rash:  Milk of Magnesia     Benadryl    Mylanta       1% Hydrocortisone Cream  Pepcid  Pepcid Complete   Sleep Aids:  Prevacid      Ambien   Prilosec       Benadryl  Rolaids       Chamomile Tea  Tums (Limit 4/day)     Unisom         Tylenol PM         Warm milk-add vanilla or  Hemorrhoids:       Sugar for taste  Anusol/Anusol H.C.  (RX: Analapram 2.5%)  Sugar Substitutes:  Hydrocortisone OTC     Ok in moderation  Preparation H      Tucks        Vaseline lotion applied to tissue with wiping      Herpes:     Throat:  Acyclovir      Oragel  Famvir  Valtrex     Vaccines:         Flu Shot Leg Cramps:       *Gardasil  Benadryl      Hepatitis A         Hepatitis B Nasal Spray:       Pneumovax  Saline Nasal Spray     Polio Booster         Tetanus Nausea:       Tuberculosis test or PPD  Vitamin B6 25 mg TID   AVOID:    Dramamine      *Gardasil  Emetrol       Live Poliovirus  Ginger Root 250 mg QID    MMR (measles, mumps &  High Complex Carbs @ Bedtime    rebella)  Sea Bands-Accupressure    Varicella (Chickenpox)  Unisom 1/2 tab TID     *No known complications           If received  before Pain:         Known pregnancy;   Darvocet       Resume series after  Lortab        Delivery  Percocet    Yeast:   Tramadol      Femstat  Tylenol 3      Gyne-lotrimin  Ultram       Monistat  Vicodin           MISC:         All Sunscreens           Hair Coloring/highlights          Insect Repellant's          (Including DEET)         Mystic Tans   First Trimester of Pregnancy  The first trimester of pregnancy is from week 1 until the end of week 13 (months 1 through 3). During this time, your baby will begin to develop inside you. At 6-8 weeks, the eyes and face are formed, and the heartbeat can be seen on ultrasound. At the end of 12 weeks, all the baby's organs are formed. Prenatal care is all the medical care you receive before the birth of your baby. Make sure you get good prenatal care and follow all of your doctor's instructions. Follow these instructions at home: Medicines  Take over-the-counter and prescription medicines only as told by your doctor. Some medicines are safe and some medicines are not safe during pregnancy.  Take a prenatal vitamin that contains at least 600 micrograms (mcg) of folic acid.  If you have trouble pooping (constipation), take medicine that will make your stool soft (stool softener) if your doctor approves. Eating and drinking   Eat regular, healthy meals.  Your doctor will tell you the amount of weight gain that is right for you.  Avoid raw meat and uncooked cheese.  If you feel sick to your stomach (nauseous) or throw up (vomit): ? Eat 4 or 5 small meals a day instead of 3 large meals. ? Try eating a few soda crackers. ? Drink liquids between meals instead of during meals.  To prevent constipation: ? Eat foods that are high in fiber, like fresh fruits and vegetables, whole grains, and beans. ? Drink enough fluids to keep your pee (urine) clear or pale yellow. Activity  Exercise only as told by your doctor. Stop exercising  if you  have cramps or pain in your lower belly (abdomen) or low back.  Do not exercise if it is too hot, too humid, or if you are in a place of great height (high altitude).  Try to avoid standing for long periods of time. Move your legs often if you must stand in one place for a long time.  Avoid heavy lifting.  Wear low-heeled shoes. Sit and stand up straight.  You can have sex unless your doctor tells you not to. Relieving pain and discomfort  Wear a good support bra if your breasts are sore.  Take warm water baths (sitz baths) to soothe pain or discomfort caused by hemorrhoids. Use hemorrhoid cream if your doctor says it is okay.  Rest with your legs raised if you have leg cramps or low back pain.  If you have puffy, bulging veins (varicose veins) in your legs: ? Wear support hose or compression stockings as told by your doctor. ? Raise (elevate) your feet for 15 minutes, 3-4 times a day. ? Limit salt in your food. Prenatal care  Schedule your prenatal visits by the twelfth week of pregnancy.  Write down your questions. Take them to your prenatal visits.  Keep all your prenatal visits as told by your doctor. This is important. Safety  Wear your seat belt at all times when driving.  Make a list of emergency phone numbers. The list should include numbers for family, friends, the hospital, and police and fire departments. General instructions  Ask your doctor for a referral to a local prenatal class. Begin classes no later than at the start of month 6 of your pregnancy.  Ask for help if you need counseling or if you need help with nutrition. Your doctor can give you advice or tell you where to go for help.  Do not use hot tubs, steam rooms, or saunas.  Do not douche or use tampons or scented sanitary pads.  Do not cross your legs for long periods of time.  Avoid all herbs and alcohol. Avoid drugs that are not approved by your doctor.  Do not use any tobacco products,  including cigarettes, chewing tobacco, and electronic cigarettes. If you need help quitting, ask your doctor. You may get counseling or other support to help you quit.  Avoid cat litter boxes and soil used by cats. These carry germs that can cause birth defects in the baby and can cause a loss of your baby (miscarriage) or stillbirth.  Visit your dentist. At home, brush your teeth with a soft toothbrush. Be gentle when you floss. Contact a doctor if:  You are dizzy.  You have mild cramps or pressure in your lower belly.  You have a nagging pain in your belly area.  You continue to feel sick to your stomach, you throw up, or you have watery poop (diarrhea).  You have a bad smelling fluid coming from your vagina.  You have pain when you pee (urinate).  You have increased puffiness (swelling) in your face, hands, legs, or ankles. Get help right away if:  You have a fever.  You are leaking fluid from your vagina.  You have spotting or bleeding from your vagina.  You have very bad belly cramping or pain.  You gain or lose weight rapidly.  You throw up blood. It may look like coffee grounds.  You are around people who have German measles, fifth disease, or chickenpox.  You have a very bad headache.  You   have shortness of breath.  You have any kind of trauma, such as from a fall or a car accident. Summary  The first trimester of pregnancy is from week 1 until the end of week 13 (months 1 through 3).  To take care of yourself and your unborn baby, you will need to eat healthy meals, take medicines only if your doctor tells you to do so, and do activities that are safe for you and your baby.  Keep all follow-up visits as told by your doctor. This is important as your doctor will have to ensure that your baby is healthy and growing well. This information is not intended to replace advice given to you by your health care provider. Make sure you discuss any questions you have with  your health care provider. Document Revised: 08/01/2018 Document Reviewed: 04/18/2016 Elsevier Patient Education  [place].  Morning Sickness  Morning sickness is when you feel sick to your stomach (nauseous) during pregnancy. You may feel sick to your stomach and throw up (vomit). You may feel sick in the morning, but you can feel this way at any time of day. Some women feel very sick to their stomach and cannot stop throwing up (hyperemesis gravidarum). Follow these instructions at home: Medicines  Take over-the-counter and prescription medicines only as told by your doctor. Do not take any medicines until you talk with your doctor about them first.  Taking multivitamins before getting pregnant can stop or lessen the harshness of morning sickness. Eating and drinking  Eat dry toast or crackers before getting out of bed.  Eat 5 or 6 small meals a day.  Eat dry and bland foods like rice and baked potatoes.  Do not eat greasy, fatty, or spicy foods.  Have someone cook for you if the smell of food causes you to feel sick or throw up.  If you feel sick to your stomach after taking prenatal vitamins, take them at night or with a snack.  Eat protein when you need a snack. Nuts, yogurt, and cheese are good choices.  Drink fluids throughout the day.  Try ginger ale made with real ginger, ginger tea made from fresh grated ginger, or ginger candies. General instructions  Do not use any products that have nicotine or tobacco in them, such as cigarettes and e-cigarettes. If you need help quitting, ask your doctor.  Use an air purifier to keep the air in your house free of smells.  Get lots of fresh air.  Try to avoid smells that make you feel sick.  Try: ? Wearing a bracelet that is used for seasickness (acupressure wristband). ? Going to a doctor who puts thin needles into certain body points (acupuncture) to improve how you feel. Contact a doctor if:  You need medicine  to feel better.  You feel dizzy or light-headed.  You are losing weight. Get help right away if:  You feel very sick to your stomach and cannot stop throwing up.  You pass out (faint).  You have very bad pain in your belly. Summary  Morning sickness is when you feel sick to your stomach (nauseous) during pregnancy.  You may feel sick in the morning, but you can feel this way at any time of day.  Making some changes to what you eat may help your symptoms go away. This information is not intended to replace advice given to you by your health care provider. Make sure you discuss any questions you have with your  health care provider. Document Revised: 03/23/2017 Document Reviewed: 05/11/2016 Elsevier Patient Education  2020 [employer].

## 2020-01-12 NOTE — Progress Notes (Signed)
LMP 11/29/19

## 2020-01-13 ENCOUNTER — Encounter: Payer: Self-pay | Admitting: Certified Nurse Midwife

## 2020-01-13 NOTE — Progress Notes (Signed)
GYN ENCOUNTER NOTE  Subjective:       Brenda Mccoy is a 24 y.o. G0P0000 female here for pregnancy confirmation.   Reports positive home pregnancy test. Endorses nausea with single episode of vomiting this morning, fatigue and breast tenderness.   Denies difficulty breathing or respiratory distress, chest pain, dysuria, and leg pain or swelling.    Gynecologic History  Patient's last menstrual period was 11/29/2019.  Gestational age: 66 weeks 2 days  Estimated date of birth: 09/04/2018  Contraception: none  Last Pap: 09/2019. Results were: normal  Obstetric History  OB History  Gravida Para Term Preterm AB Living  1            SAB TAB Ectopic Multiple Live Births               # Outcome Date GA Lbr Len/2nd Weight Sex Delivery Anes PTL Lv  1 Current             Past Medical History:  Diagnosis Date  . No pertinent past medical history     Past Surgical History:  Procedure Laterality Date  . no surgical history      No current outpatient medications on file prior to visit.   No current facility-administered medications on file prior to visit.    No Known Allergies  Social History   Socioeconomic History  . Marital status: Single    Spouse name: Not on file  . Number of children: Not on file  . Years of education: Not on file  . Highest education level: Not on file  Occupational History  . Not on file  Tobacco Use  . Smoking status: Never Smoker  . Smokeless tobacco: Never Used  Vaping Use  . Vaping Use: Never used  Substance and Sexual Activity  . Alcohol use: Not Currently  . Drug use: Not Currently  . Sexual activity: Yes    Birth control/protection: None  Other Topics Concern  . Not on file  Social History Narrative  . Not on file   Social Determinants of Health   Financial Resource Strain:   . Difficulty of Paying Living Expenses: Not on file  Food Insecurity:   . Worried About Programme researcher, broadcasting/film/video in the Last Year: Not on file  .  Ran Out of Food in the Last Year: Not on file  Transportation Needs:   . Lack of Transportation (Medical): Not on file  . Lack of Transportation (Non-Medical): Not on file  Physical Activity:   . Days of Exercise per Week: Not on file  . Minutes of Exercise per Session: Not on file  Stress:   . Feeling of Stress : Not on file  Social Connections:   . Frequency of Communication with Friends and Family: Not on file  . Frequency of Social Gatherings with Friends and Family: Not on file  . Attends Religious Services: Not on file  . Active Member of Clubs or Organizations: Not on file  . Attends Banker Meetings: Not on file  . Marital Status: Not on file  Intimate Partner Violence:   . Fear of Current or Ex-Partner: Not on file  . Emotionally Abused: Not on file  . Physically Abused: Not on file  . Sexually Abused: Not on file    Family History  Problem Relation Age of Onset  . Hypertension Mother   . Healthy Maternal Grandmother   . Healthy Maternal Grandfather     The following portions of the patient's  history were reviewed and updated as appropriate: allergies, current medications, past family history, past medical history, past social history, past surgical history and problem list.  Review of Systems  ROS negative except as noted above. Information obtained from patient.   Objective:   BP 113/74   Pulse 91   Ht 5\' 4"  (1.626 m)   Wt 230 lb 6.4 oz (104.5 kg)   LMP 11/29/2019   BMI 39.55 kg/m    CONSTITUTIONAL: Well-developed, well-nourished female in no acute distress.   PHYSICAL EXAM: Not indicated.   Recent Results (from the past 2160 hour(s))  POCT urine pregnancy     Status: Abnormal   Collection Time: 01/12/20  4:16 PM  Result Value Ref Range   Preg Test, Ur Positive (A) Negative    Assessment:   1. Missed menses  - POCT urine pregnancy  2. Positive pregnancy test   3. Morning sickness   Plan:   First trimester education, see  AVS.   Prenatal vitamin samples given.   Reviewed red flag symptoms and when to call.   RTC x 2-3 weeks for dating/viability ultrasound & intake.   RTC x 5-6 weeks for NOB PE.    01/14/20, CNM [facility], [facility]   I spent 15 minutes dedicated to the care of this patient on the date of this encounter to include pre-visit review of records, face to face time with the patient, and post visit ordering of testing.

## 2020-02-03 ENCOUNTER — Ambulatory Visit (INDEPENDENT_AMBULATORY_CARE_PROVIDER_SITE_OTHER): Payer: BC Managed Care – PPO | Admitting: Certified Nurse Midwife

## 2020-02-03 ENCOUNTER — Telehealth: Payer: Self-pay

## 2020-02-03 ENCOUNTER — Other Ambulatory Visit: Payer: Self-pay | Admitting: Certified Nurse Midwife

## 2020-02-03 ENCOUNTER — Ambulatory Visit (INDEPENDENT_AMBULATORY_CARE_PROVIDER_SITE_OTHER): Payer: BC Managed Care – PPO

## 2020-02-03 ENCOUNTER — Other Ambulatory Visit: Payer: Self-pay

## 2020-02-03 VITALS — BP 111/77 | HR 71 | Wt 226.1 lb

## 2020-02-03 DIAGNOSIS — Z3141 Encounter for fertility testing: Secondary | ICD-10-CM

## 2020-02-03 DIAGNOSIS — Z01419 Encounter for gynecological examination (general) (routine) without abnormal findings: Secondary | ICD-10-CM

## 2020-02-03 DIAGNOSIS — Z3490 Encounter for supervision of normal pregnancy, unspecified, unspecified trimester: Secondary | ICD-10-CM

## 2020-02-03 DIAGNOSIS — O219 Vomiting of pregnancy, unspecified: Secondary | ICD-10-CM

## 2020-02-03 LAB — OB RESULTS CONSOLE VARICELLA ZOSTER ANTIBODY, IGG: Varicella: IMMUNE

## 2020-02-03 MED ORDER — DOXYLAMINE-PYRIDOXINE 10-10 MG PO TBEC: 10.0000 mg | DELAYED_RELEASE_TABLET | Freq: Every day | ORAL | 1 refills | Status: DC

## 2020-02-03 NOTE — Telephone Encounter (Signed)
[facility] called and stated that Mrs. Perryman is in the office and she needs a release to do X-rays. I called back to [facility] and told her about the pt and what they need she stated she needed to ask. She asked [facility] and she said as long as they double cover her. I told Darrold Junker that we have the notes up here and I can fax it over and she said that is okay. I got back on the phone and told the lady that I can fax over the note and that the nurse stated that she will need to be double covered. The lady gave me [phone] as the fax number

## 2020-02-03 NOTE — Patient Instructions (Addendum)
Morning Sickness  Morning sickness is when you feel sick to your stomach (nauseous) during pregnancy. You may feel sick to your stomach and throw up (vomit). You may feel sick in the morning, but you can feel this way at any time of day. Some women feel very sick to their stomach and cannot stop throwing up (hyperemesis gravidarum). Follow these instructions at home: Medicines  Take over-the-counter and prescription medicines only as told by your doctor. Do not take any medicines until you talk with your doctor about them first.  Taking multivitamins before getting pregnant can stop or lessen the harshness of morning sickness. Eating and drinking  Eat dry toast or crackers before getting out of bed.  Eat 5 or 6 small meals a day.  Eat dry and bland foods like rice and baked potatoes.  Do not eat greasy, fatty, or spicy foods.  Have someone cook for you if the smell of food causes you to feel sick or throw up.  If you feel sick to your stomach after taking prenatal vitamins, take them at night or with a snack.  Eat protein when you need a snack. Nuts, yogurt, and cheese are good choices.  Drink fluids throughout the day.  Try ginger ale made with real ginger, ginger tea made from fresh grated ginger, or ginger candies. General instructions  Do not use any products that have nicotine or tobacco in them, such as cigarettes and e-cigarettes. If you need help quitting, ask your doctor.  Use an air purifier to keep the air in your house free of smells.  Get lots of fresh air.  Try to avoid smells that make you feel sick.  Try: ? Wearing a bracelet that is used for seasickness (acupressure wristband). ? Going to a doctor who puts thin needles into certain body points (acupuncture) to improve how you feel. Contact a doctor if:  You need medicine to feel better.  You feel dizzy or light-headed.  You are losing weight. Get help right away if:  You feel very sick to your  stomach and cannot stop throwing up.  You pass out (faint).  You have very bad pain in your belly. Summary  Morning sickness is when you feel sick to your stomach (nauseous) during pregnancy.  You may feel sick in the morning, but you can feel this way at any time of day.  Making some changes to what you eat may help your symptoms go away. This information is not intended to replace advice given to you by your health care provider. Make sure you discuss any questions you have with your health care provider. Document Revised: 03/23/2017 Document Reviewed: 05/11/2016 Elsevier Patient Education  2020 [employer].  How a Baby Grows During Pregnancy  Pregnancy begins when a female's sperm enters a female's egg (fertilization). Fertilization usually happens in one of the tubes (fallopian tubes) that connect the ovaries to the womb (uterus). The fertilized egg moves down the fallopian tube to the uterus. Once it reaches the uterus, it implants into the lining of the uterus and begins to grow. For the first 10 weeks, the fertilized egg is called an embryo. After 10 weeks, it is called a fetus. As the fetus continues to grow, it receives oxygen and nutrients through tissue (placenta) that grows to support the developing baby. The placenta is the life support system for the baby. It provides oxygen and nutrition and removes waste. Learning as much as you can about your pregnancy and how  your baby is developing can help you enjoy the experience. It can also make you aware of when there might be a problem and when to ask questions. How long does a typical pregnancy last? A pregnancy usually lasts 280 days, or about 40 weeks. Pregnancy is divided into three periods of growth, also called trimesters:  First trimester: 0-12 weeks.  Second trimester: 13-27 weeks.  Third trimester: 28-40 weeks. The day when your baby is ready to be born (full term) is your estimated date of delivery. How does my baby  develop month by month? First month  The fertilized egg attaches to the inside of the uterus.  Some cells will form the placenta. Others will form the fetus.  The arms, legs, brain, spinal cord, lungs, and heart begin to develop.  At the end of the first month, the heart begins to beat. Second month  The bones, inner ear, eyelids, hands, and feet form.  The genitals develop.  By the end of 8 weeks, all major organs are developing. Third month  All of the internal organs are forming.  Teeth develop below the gums.  Bones and muscles begin to grow. The spine can flex.  The skin is transparent.  Fingernails and toenails begin to form.  Arms and legs continue to grow longer, and hands and feet develop.  The fetus is about 3 inches (7.6 cm) long. Fourth month  The placenta is completely formed.  The external sex organs, neck, outer ear, eyebrows, eyelids, and fingernails are formed.  The fetus can hear, swallow, and move its arms and legs.  The kidneys begin to produce urine.  The skin is covered with a white, waxy coating (vernix) and very fine hair (lanugo). Fifth month  The fetus moves around more and can be felt for the first time (quickening).  The fetus starts to sleep and wake up and may begin to suck its finger.  The nails grow to the end of the fingers.  The organ in the digestive system that makes bile (gallbladder) functions and helps to digest nutrients.  If your baby is a girl, eggs are present in her ovaries. If your baby is a boy, testicles start to move down into his scrotum. Sixth month  The lungs are formed.  The eyes open. The brain continues to develop.  Your baby has fingerprints and toe prints. Your baby's hair grows thicker.  At the end of the second trimester, the fetus is about 9 inches (22.9 cm) long. Seventh month  The fetus kicks and stretches.  The eyes are developed enough to sense changes in light.  The hands can make a  grasping motion.  The fetus responds to sound. Eighth month  All organs and body systems are fully developed and functioning.  Bones harden, and taste buds develop. The fetus may hiccup.  Certain areas of the brain are still developing. The skull remains soft. Ninth month  The fetus gains about  lb (0.23 kg) each week.  The lungs are fully developed.  Patterns of sleep develop.  The fetus's head typically moves into a head-down position (vertex) in the uterus to prepare for birth.  The fetus weighs 6-9 lb (2.72-4.08 kg) and is 19-20 inches (48.26-50.8 cm) long. What can I do to have a healthy pregnancy and help my baby develop? General instructions  Take prenatal vitamins as directed by your health care provider. These include vitamins such as folic acid, iron, calcium, and vitamin D. They are important for  healthy development.  Take medicines only as directed by your health care provider. Read labels and ask a pharmacist or your health care provider whether over-the-counter medicines, supplements, and prescription drugs are safe to take during pregnancy.  Keep all follow-up visits as directed by your health care provider. This is important. Follow-up visits include prenatal care and screening tests. How do I know if my baby is developing well? At each prenatal visit, your health care provider will do several different tests to check on your health and keep track of your baby's development. These include:  Fundal height and position. ? Your health care provider will measure your growing belly from your pubic bone to the top of the uterus using a tape measure. ? Your health care provider will also feel your belly to determine your baby's position.  Heartbeat. ? An ultrasound in the first trimester can confirm pregnancy and show a heartbeat, depending on how far along you are. ? Your health care provider will check your baby's heart rate at every prenatal visit.  Second  trimester ultrasound. ? This ultrasound checks your baby's development. It also may show your baby's gender. What should I do if I have concerns about my baby's development? Always talk with your health care provider about any concerns that you may have about your pregnancy and your baby. Summary  A pregnancy usually lasts 280 days, or about 40 weeks. Pregnancy is divided into three periods of growth, also called trimesters.  Your health care provider will monitor your baby's growth and development throughout your pregnancy.  Follow your health care provider's recommendations about taking prenatal vitamins and medicines during your pregnancy.  Talk with your health care provider if you have any concerns about your pregnancy or your developing baby. This information is not intended to replace advice given to you by your health care provider. Make sure you discuss any questions you have with your health care provider. Document Revised: 08/01/2018 Document Reviewed: 02/21/2017 Elsevier Patient Education  2020 [place] of Pregnancy  The first trimester of pregnancy is from week 1 until the end of week 13 (months 1 through 3). During this time, your baby will begin to develop inside you. At 6-8 weeks, the eyes and face are formed, and the heartbeat can be seen on ultrasound. At the end of 12 weeks, all the baby's organs are formed. Prenatal care is all the medical care you receive before the birth of your baby. Make sure you get good prenatal care and follow all of your doctor's instructions. Follow these instructions at home: Medicines  Take over-the-counter and prescription medicines only as told by your doctor. Some medicines are safe and some medicines are not safe during pregnancy.  Take a prenatal vitamin that contains at least 600 micrograms (mcg) of folic acid.  If you have trouble pooping (constipation), take medicine that will make your stool soft (stool  softener) if your doctor approves. Eating and drinking   Eat regular, healthy meals.  Your doctor will tell you the amount of weight gain that is right for you.  Avoid raw meat and uncooked cheese.  If you feel sick to your stomach (nauseous) or throw up (vomit): ? Eat 4 or 5 small meals a day instead of 3 large meals. ? Try eating a few soda crackers. ? Drink liquids between meals instead of during meals.  To prevent constipation: ? Eat foods that are high in fiber, like fresh fruits and vegetables, whole  grains, and beans. ? Drink enough fluids to keep your pee (urine) clear or pale yellow. Activity  Exercise only as told by your doctor. Stop exercising if you have cramps or pain in your lower belly (abdomen) or low back.  Do not exercise if it is too hot, too humid, or if you are in a place of great height (high altitude).  Try to avoid standing for long periods of time. Move your legs often if you must stand in one place for a long time.  Avoid heavy lifting.  Wear low-heeled shoes. Sit and stand up straight.  You can have sex unless your doctor tells you not to. Relieving pain and discomfort  Wear a good support bra if your breasts are sore.  Take warm water baths (sitz baths) to soothe pain or discomfort caused by hemorrhoids. Use hemorrhoid cream if your doctor says it is okay.  Rest with your legs raised if you have leg cramps or low back pain.  If you have puffy, bulging veins (varicose veins) in your legs: ? Wear support hose or compression stockings as told by your doctor. ? Raise (elevate) your feet for 15 minutes, 3-4 times a day. ? Limit salt in your food. Prenatal care  Schedule your prenatal visits by the twelfth week of pregnancy.  Write down your questions. Take them to your prenatal visits.  Keep all your prenatal visits as told by your doctor. This is important. Safety  Wear your seat belt at all times when driving.  Make a list of emergency  phone numbers. The list should include numbers for family, friends, the hospital, and police and fire departments. General instructions  Ask your doctor for a referral to a local prenatal class. Begin classes no later than at the start of month 6 of your pregnancy.  Ask for help if you need counseling or if you need help with nutrition. Your doctor can give you advice or tell you where to go for help.  Do not use hot tubs, steam rooms, or saunas.  Do not douche or use tampons or scented sanitary pads.  Do not cross your legs for long periods of time.  Avoid all herbs and alcohol. Avoid drugs that are not approved by your doctor.  Do not use any tobacco products, including cigarettes, chewing tobacco, and electronic cigarettes. If you need help quitting, ask your doctor. You may get counseling or other support to help you quit.  Avoid cat litter boxes and soil used by cats. These carry germs that can cause birth defects in the baby and can cause a loss of your baby (miscarriage) or stillbirth.  Visit your dentist. At home, brush your teeth with a soft toothbrush. Be gentle when you floss. Contact a doctor if:  You are dizzy.  You have mild cramps or pressure in your lower belly.  You have a nagging pain in your belly area.  You continue to feel sick to your stomach, you throw up, or you have watery poop (diarrhea).  You have a bad smelling fluid coming from your vagina.  You have pain when you pee (urinate).  You have increased puffiness (swelling) in your face, hands, legs, or ankles. Get help right away if:  You have a fever.  You are leaking fluid from your vagina.  You have spotting or bleeding from your vagina.  You have very bad belly cramping or pain.  You gain or lose weight rapidly.  You throw up blood. It may  look like coffee grounds.  You are around people who have [place] measles, fifth disease, or chickenpox.  You have a very bad headache.  You have  shortness of breath.  You have any kind of trauma, such as from a fall or a car accident. Summary  The first trimester of pregnancy is from week 1 until the end of week 13 (months 1 through 3).  To take care of yourself and your unborn baby, you will need to eat healthy meals, take medicines only if your doctor tells you to do so, and do activities that are safe for you and your baby.  Keep all follow-up visits as told by your doctor. This is important as your doctor will have to ensure that your baby is healthy and growing well. This information is not intended to replace advice given to you by your health care provider. Make sure you discuss any questions you have with your health care provider. Document Revised: 08/01/2018 Document Reviewed: 04/18/2016 Elsevier Patient Education  [place].   Common Medications Safe in Pregnancy  Acne:      Constipation:  Benzoyl Peroxide     Colace  Clindamycin      Dulcolax Suppository  Topica Erythromycin     Fibercon  Salicylic Acid      Metamucil         Miralax AVOID:        Senakot   Accutane    Cough:  Retin-A       Cough Drops  Tetracycline      Phenergan w/ Codeine if Rx  Minocycline      Robitussin (Plain & DM)  Antibiotics:     Crabs/Lice:  Ceclor       RID  Cephalosporins    AVOID:  E-Mycins      Kwell  Keflex  Macrobid/Macrodantin   Diarrhea:  Penicillin      Kao-Pectate  Zithromax      Imodium AD         PUSH FLUIDS AVOID:       Cipro     Fever:  Tetracycline      Tylenol (Regular or Extra  Minocycline       Strength)  Levaquin      Extra Strength-Do not          Exceed 8 tabs/24 hrs Caffeine:        <287m/day (equiv. To 1 cup of coffee or  approx. 3 12 oz sodas)         Gas: Cold/Hayfever:       Gas-X  Benadryl      Mylicon  Claritin       Phazyme  **Claritin-D        Chlor-Trimeton    Headaches:  Dimetapp      ASA-Free Excedrin  Drixoral-Non-Drowsy     Cold Compress  Mucinex (Guaifenasin)     Tylenol  (Regular or Extra  Sudafed/Sudafed-12 Hour     Strength)  **Sudafed PE Pseudoephedrine   Tylenol Cold & Sinus     Vicks Vapor Rub  Zyrtec  **AVOID if Problems With Blood Pressure         Heartburn: Avoid lying down for at least 1 hour after meals  Aciphex      Maalox     Rash:  Milk of Magnesia     Benadryl    Mylanta       1% Hydrocortisone Cream  Pepcid  Pepcid Complete   Sleep Aids:  Prevacid      Ambien   Prilosec       Benadryl  Rolaids       Chamomile Tea  Tums (Limit 4/day)     Unisom         Tylenol PM         Warm milk-add vanilla or  Hemorrhoids:       Sugar for taste  Anusol/Anusol H.C.  (RX: Analapram 2.5%)  Sugar Substitutes:  Hydrocortisone OTC     Ok in moderation  Preparation H      Tucks        Vaseline lotion applied to tissue with wiping    Herpes:     Throat:  Acyclovir      Oragel  Famvir  Valtrex     Vaccines:         Flu Shot Leg Cramps:       *Gardasil  Benadryl      Hepatitis A         Hepatitis B Nasal Spray:       Pneumovax  Saline Nasal Spray     Polio Booster         Tetanus Nausea:       Tuberculosis test or PPD  Vitamin B6 25 mg TID   AVOID:    Dramamine      *Gardasil  Emetrol       Live Poliovirus  Ginger Root 250 mg QID    MMR (measles, mumps &  High Complex Carbs @ Bedtime    rebella)  Sea Bands-Accupressure    Varicella (Chickenpox)  Unisom 1/2 tab TID     *No known complications           If received before Pain:         Known pregnancy;   Darvocet       Resume series after  Lortab        Delivery  Percocet    Yeast:   Tramadol      Femstat  Tylenol 3      Gyne-lotrimin  Ultram       Monistat  Vicodin           MISC:         All Sunscreens           Hair Coloring/highlights          Insect Repellant's          (Including DEET)         [facility]  [place], [place], [place] [address]  Phone: [phone]   [facility] (second  location)  [address] [place], [place] [address]  Phone: [phone]   [facility]) [place], [place], [place] [address] Phone: [phone]   [facility] [address] [place], [place] [address]  Phone: [phone]

## 2020-02-03 NOTE — Telephone Encounter (Signed)
mychart message needed

## 2020-02-03 NOTE — Telephone Encounter (Signed)
Patient needs work note stating her limitations due to her being pregnant. Could you please advise? Patient requests we send it to her via MyChart.

## 2020-02-03 NOTE — Progress Notes (Signed)
Pt presents for NOB nurse interview visit. Pregnancy confirmation done 01/12/20. G1 . P0. Pregnancy education material explained and given. No cats in the home. NOB labs ordered. (TSH/HbgA1c due to increased BMI), (sickle cell). HIV labs and Drug screen were explained optional and she declined. Pt states she recently had screening done due to history of chlamydia in the past  Drug screen declined. Pt taking PNV. Genetic screening options discussed . To be ordered at NOB physical.Pt. To follow up with provider in 2 weeks for NOB physical. Financial policy reviewed. FMLA paperwork policy reviewed and signed. All questions answered.

## 2020-02-04 ENCOUNTER — Encounter: Payer: Self-pay | Admitting: Certified Nurse Midwife

## 2020-02-04 LAB — HEMOGLOBIN A1C
Est. average glucose Bld gHb Est-mCnc: 128 mg/dL
Hgb A1c MFr Bld: 6.1 % — ABNORMAL HIGH (ref 4.8–5.6)

## 2020-02-04 LAB — TSH: TSH: 2.66 u[IU]/mL (ref 0.450–4.500)

## 2020-02-04 LAB — URINALYSIS, ROUTINE W REFLEX MICROSCOPIC
Bilirubin, UA: NEGATIVE
Glucose, UA: NEGATIVE
Ketones, UA: NEGATIVE
Leukocytes,UA: NEGATIVE
Nitrite, UA: NEGATIVE
Protein,UA: NEGATIVE
RBC, UA: NEGATIVE
Specific Gravity, UA: 1.013 (ref 1.005–1.030)
Urobilinogen, Ur: 0.2 mg/dL (ref 0.2–1.0)
pH, UA: 7 (ref 5.0–7.5)

## 2020-02-04 LAB — RPR: RPR Ser Ql: NONREACTIVE

## 2020-02-04 LAB — HEPATITIS B SURFACE ANTIGEN: Hepatitis B Surface Ag: NEGATIVE

## 2020-02-04 LAB — VARICELLA ZOSTER ANTIBODY, IGG: Varicella zoster IgG: 318 index (ref >165)

## 2020-02-04 LAB — ABO AND RH: Rh Factor: POSITIVE

## 2020-02-04 LAB — RUBELLA SCREEN: Rubella Antibodies, IGG: 12 index (ref >0.99)

## 2020-02-04 LAB — HGB SOLU + RFLX FRAC: Sickle Solubility Test - HGBRFX: NEGATIVE

## 2020-02-04 LAB — ANTIBODY SCREEN: Antibody Screen: NEGATIVE

## 2020-02-04 NOTE — Progress Notes (Signed)
Pregnancy Restriction note sent via MyChart.    Brenda Mccoy, CNM [facility], [facility] 02/04/20 11:07 AM

## 2020-02-05 ENCOUNTER — Other Ambulatory Visit: Payer: Self-pay | Admitting: Certified Nurse Midwife

## 2020-02-05 ENCOUNTER — Encounter: Payer: Self-pay | Admitting: Certified Nurse Midwife

## 2020-02-05 LAB — CULTURE, OB URINE

## 2020-02-05 LAB — GC/CHLAMYDIA PROBE AMP
Chlamydia trachomatis, NAA: NEGATIVE
Neisseria Gonorrhoeae by PCR: NEGATIVE

## 2020-02-05 LAB — URINE CULTURE, OB REFLEX

## 2020-02-05 NOTE — Progress Notes (Signed)
Dental letter sent via MyChart.    Brenda Mccoy, CNM [facility], [facility] 02/05/20 8:51 AM

## 2020-02-05 NOTE — Progress Notes (Signed)
Dental letter sent per patient request, see MyChart.    [name], CNM [facility], [facility] 02/05/20 8:51 AM

## 2020-02-16 ENCOUNTER — Other Ambulatory Visit: Payer: Self-pay

## 2020-02-16 ENCOUNTER — Ambulatory Visit (INDEPENDENT_AMBULATORY_CARE_PROVIDER_SITE_OTHER): Payer: BC Managed Care – PPO | Admitting: Certified Nurse Midwife

## 2020-02-16 ENCOUNTER — Encounter: Payer: Self-pay | Admitting: Certified Nurse Midwife

## 2020-02-16 VITALS — BP 112/64 | HR 73 | Wt 226.0 lb

## 2020-02-16 DIAGNOSIS — O99210 Obesity complicating pregnancy, unspecified trimester: Secondary | ICD-10-CM | POA: Insufficient documentation

## 2020-02-16 DIAGNOSIS — Z3A11 11 weeks gestation of pregnancy: Secondary | ICD-10-CM

## 2020-02-16 DIAGNOSIS — O99213 Obesity complicating pregnancy, third trimester: Secondary | ICD-10-CM

## 2020-02-16 DIAGNOSIS — O219 Vomiting of pregnancy, unspecified: Secondary | ICD-10-CM

## 2020-02-16 DIAGNOSIS — Z3401 Encounter for supervision of normal first pregnancy, first trimester: Secondary | ICD-10-CM

## 2020-02-16 DIAGNOSIS — Z6720 Type B blood, Rh positive: Secondary | ICD-10-CM | POA: Insufficient documentation

## 2020-02-16 DIAGNOSIS — R7303 Prediabetes: Secondary | ICD-10-CM

## 2020-02-16 HISTORY — DX: Obesity complicating pregnancy, third trimester: O99.213

## 2020-02-16 LAB — POCT URINALYSIS DIPSTICK OB
Bilirubin, UA: NEGATIVE
Blood, UA: NEGATIVE
Glucose, UA: NEGATIVE
Ketones, UA: NEGATIVE
Leukocytes, UA: NEGATIVE
Nitrite, UA: NEGATIVE
POC,PROTEIN,UA: NEGATIVE
Spec Grav, UA: 1.015 (ref 1.010–1.025)
Urobilinogen, UA: 0.2 E.U./dL
pH, UA: 8 (ref 5.0–8.0)

## 2020-02-16 MED ORDER — FOLIC ACID 1 MG PO TABS: 1.0000 mg | ORAL_TABLET | Freq: Every day | ORAL | 10 refills | Status: DC

## 2020-02-16 MED ORDER — ASPIRIN EC 81 MG PO TBEC: 81.0000 mg | DELAYED_RELEASE_TABLET | Freq: Every day | ORAL | 2 refills | Status: DC

## 2020-02-16 NOTE — Progress Notes (Signed)
NEW OB HISTORY AND PHYSICAL  SUBJECTIVE:       Brenda Mccoy is a 24 y.o. G1P0 female, Patient's last menstrual period was 11/29/2019., Estimated Date of Delivery: 09/06/20, [redacted]w[redacted]d, presents today for establishment of Prenatal Care.  Endorses nausea without vomiting and food aversion-mostly meat.   Desires genetic screening. Prefers midwifery care.   Denies difficulty breathing or respiratory distress, chest pain, abdominal pain, vaginal bleeding, dysuria, and leg pain or swelling.    Gynecologic History  Patient's last menstrual period was 11/29/2019.   Contraception: none  Last Pap: 09/2019. Results were: normal  Obstetric History  OB History  Gravida Para Term Preterm AB Living  1            SAB TAB Ectopic Multiple Live Births               # Outcome Date GA Lbr Len/2nd Weight Sex Delivery Anes PTL Lv  1 Current             History reviewed. No pertinent past medical history.  Past Surgical History:  Procedure Laterality Date  . no surgical history    . ROOT CANAL    . WISDOM TOOTH EXTRACTION     1    Current Outpatient Medications on File Prior to Visit  Medication Sig Dispense Refill  . Doxylamine-Pyridoxine 10-10 MG TBEC Take 10 mg by mouth daily. 60 tablet 1  . Prenatal Vit-Fe Fumarate-FA (MULTIVITAMIN-PRENATAL) 27-0.8 MG TABS tablet Take 1 tablet by mouth daily at 12 noon.     No current facility-administered medications on file prior to visit.    Allergies  Allergen Reactions  . Miconazole     Social History   Socioeconomic History  . Marital status: Single    Spouse name: Not on file  . Number of children: Not on file  . Years of education: Not on file  . Highest education level: Not on file  Occupational History  . Not on file  Tobacco Use  . Smoking status: Never Smoker  . Smokeless tobacco: Never Used  Vaping Use  . Vaping Use: Never used  Substance and Sexual Activity  . Alcohol use: Not Currently  . Drug use: Never  . Sexual  activity: Yes    Birth control/protection: Coitus interruptus  Other Topics Concern  . Not on file  Social History Narrative  . Not on file   Social Determinants of Health   Financial Resource Strain:   . Difficulty of Paying Living Expenses: Not on file  Food Insecurity:   . Worried About Programme researcher, broadcasting/film/video in the Last Year: Not on file  . Ran Out of Food in the Last Year: Not on file  Transportation Needs:   . Lack of Transportation (Medical): Not on file  . Lack of Transportation (Non-Medical): Not on file  Physical Activity:   . Days of Exercise per Week: Not on file  . Minutes of Exercise per Session: Not on file  Stress:   . Feeling of Stress : Not on file  Social Connections:   . Frequency of Communication with Friends and Family: Not on file  . Frequency of Social Gatherings with Friends and Family: Not on file  . Attends Religious Services: Not on file  . Active Member of Clubs or Organizations: Not on file  . Attends Banker Meetings: Not on file  . Marital Status: Not on file  Intimate Partner Violence:   . Fear of Current or Ex-Partner: Not  on file  . Emotionally Abused: Not on file  . Physically Abused: Not on file  . Sexually Abused: Not on file    Family History  Problem Relation Age of Onset  . Hypertension Mother   . Healthy Maternal Grandmother   . Healthy Maternal Grandfather     The following portions of the patient's history were reviewed and updated as appropriate: allergies, current medications, past OB history, past medical history, past surgical history, past family history, past social history, and problem list.  Review of Systems:  ROS negative except as noted above. Information obtained from patient.   OBJECTIVE:  BP 112/64   Pulse 73   Wt 226 lb (102.5 kg)   LMP 11/29/2019   BMI 38.79 kg/m   Initial Physical Exam (New OB)  GENERAL APPEARANCE: alert, well appearing, in no apparent distress  HEAD: normocephalic,  atraumatic  MOUTH: mucous membranes moist, pharynx normal without lesions  THYROID: no thyromegaly or masses present  BREASTS: patient declined exam  LUNGS: clear to auscultation, no wheezes, rales or rhonchi, symmetric air entry  HEART: regular rate and rhythm, no murmurs  ABDOMEN: soft, nontender, nondistended, no abnormal masses, no epigastric pain, obese and FHT present  EXTREMITIES: no redness or tenderness in the calves or thighs, no edema  SKIN: normal coloration and turgor, no rashes  LYMPH NODES: no adenopathy palpable  NEUROLOGIC: alert, oriented, normal speech, no focal findings or movement disorder noted  PELVIC EXAM: patient declined exam  ASSESSMENT: Normal pregnancy Rh positive Obesity in pregnancy-Rx Folic acid and ASA 81 mg Nausea and vomiting in pregnancy Prediabetes-early glucose next visit  PLAN: Prenatal care New OB counseling: The patient has been given an overview regarding routine prenatal care. Recommendations regarding diet, weight gain, and exercise in pregnancy were given. Prenatal testing, optional genetic testing, and ultrasound use in pregnancy were reviewed.  Benefits of Breast Feeding were discussed. The patient is encouraged to consider nursing her baby post partum. See orders

## 2020-02-16 NOTE — Patient Instructions (Signed)
WHAT OB PATIENTS CAN EXPECT   Confirmation of pregnancy and ultrasound ordered if medically indicated-[redacted] weeks gestation  New OB (NOB) intake with nurse and New OB (NOB) labs- [redacted] weeks gestation  New OB (NOB) physical examination with provider- 11/[redacted] weeks gestation  Flu vaccine-[redacted] weeks gestation  Anatomy scan-[redacted] weeks gestation  Glucose tolerance test, blood work to test for anemia, T-dap vaccine-[redacted] weeks gestation  Vaginal swabs/cultures-STD/Group B strep-[redacted] weeks gestation  Appointments every 4 weeks until 28 weeks  Every 2 weeks from 28 weeks until 36 weeks  Weekly visits from 36 weeks until delivery    Healthy Weight Gain During Pregnancy, Adult A certain amount of weight gain during pregnancy is normal and healthy. How much weight you should gain depends on your overall health and a measurement called BMI (body mass index). BMI is an estimate of your body fat based on your height and weight. You can use an Freight forwarder to figure out your BMI, or you can ask your health care provider to calculate it for you at your next visit. Your recommended pregnancy weight gain is based on your pre-pregnancy BMI. General guidelines for a healthy total weight gain during pregnancy are listed below. If your BMI at or before the start of your pregnancy is:  Less than 18.5 (underweight), you should gain 28-40 lb (13-18 kg).  18.5-24.9 (normal weight), you should gain 25-35 lb (11-16 kg).  25-29.9 (overweight), you should gain 15-25 lb (7-11 kg).  30 or higher (obese), you should gain 11-20 lb (5-9 kg). These ranges vary depending on your individual health. If you are carrying more than one baby (multiples), it may be safe to gain more weight than these recommendations. If you gain less weight than recommended, that may be safe as long as your baby is growing and developing normally. How can unhealthy weight gain affect me and my baby? Gaining too much weight during pregnancy can lead to  pregnancy complications, such as:  A temporary form of diabetes that develops during pregnancy (gestational diabetes).  High blood pressure during pregnancy and protein in your urine (preeclampsia).  High blood pressure during pregnancy without protein in your urine (gestational hypertension).  Your baby having a high weight at birth, which may: ? Raise your risk of having a more difficult delivery or a surgical delivery (cesarean delivery, or C-section). ? Raise your child's risk of developing obesity during childhood. Not gaining enough weight can be life-threatening for your baby, and it may raise your baby's chances of:  Being born early (preterm).  Growing more slowly than normal during pregnancy (growth restriction).  Having a low weight at birth. What actions can I take to gain a healthy amount of weight during pregnancy? General instructions  Keep track of your weight gain during pregnancy.  Take over-the-counter and prescription medicines only as told by your health care provider. Take all prenatal supplements as directed.  Keep all health care visits during pregnancy (prenatal visits). These visits are a good time to discuss your weight gain. Your health care provider will weigh you at each visit to make sure you are gaining a healthy amount of weight. Nutrition   Eat a balanced, nutrient-rich diet. Eat plenty of: ? Fruits and vegetables, such as berries and broccoli. ? Whole grains, such as millet, barley, whole-wheat breads and cereals, and oatmeal. ? Low-fat dairy products or non-dairy products such as almond milk or rice milk. ? Protein foods, such as lean meat, chicken, eggs, and legumes (such as  peas, beans, soybeans, and lentils).  Avoid foods that are fried or have a lot of fat, salt (sodium), or sugar.  Drink enough fluid to keep your urine pale yellow.  Choose healthy snack and drink options when you are at work or on the go: ? Drink water. Avoid soda,  sports drinks, and juices that have added sugar. ? Avoid drinks with caffeine, such as coffee and energy drinks. ? Eat snacks that are high in protein, such as nuts, protein bars, and low-fat yogurt. ? Carry convenient snacks in your purse that do not need refrigeration, such as a pack of trail mix, an apple, or a granola bar.  If you need help improving your diet, work with a health care provider or a diet and nutrition specialist (dietitian). Activity   Exercise regularly, as told by your health care provider. ? If you were active before becoming pregnant, you may be able to continue your regular fitness activities. ? If you were not active before pregnancy, you may gradually build up to exercising for 30 or more minutes on most days of the week. This may include walking, swimming, or yoga.  Ask your health care provider what activities are safe for you. Talk with your health care provider about whether you may need to be excused from certain school or work activities. Where to find more information Learn more about managing your weight gain during pregnancy from:  American Pregnancy Association: www.americanpregnancy.org  [place] Department of Agriculture pregnancy weight gain calculator: Former[employer] Summary  Too much weight gain during pregnancy can lead to complications for you and your baby.  Find out your pre-pregnancy BMI to determine how much weight gain is healthy for you.  Eat nutritious foods and stay active.  Keep all of your prenatal visits as told by your health care provider. This information is not intended to replace advice given to you by your health care provider. Make sure you discuss any questions you have with your health care provider. Document Revised: 01/01/2019 Document Reviewed: 12/29/2016 Elsevier Patient Education  [place].   Exercise During Pregnancy Exercise is an important part of being healthy for people of all ages. Exercise  improves the function of your heart and lungs and helps you maintain strength, flexibility, and a healthy body weight. Exercise also boosts energy levels and elevates mood. Most women should exercise regularly during pregnancy. In rare cases, women with certain medical conditions or complications may be asked to limit or avoid exercise during pregnancy. How does this affect me? Along with maintaining general strength and flexibility, exercising during pregnancy can help:  Keep strength in muscles that are used during labor and childbirth.  Decrease low back pain.  Reduce symptoms of depression.  Control weight gain during pregnancy.  Reduce the risk of needing insulin if you develop diabetes during pregnancy.  Decrease the risk of cesarean delivery.  Speed up your recovery after giving birth. How does this affect my baby? Exercise can help you have a healthy pregnancy. Exercise does not cause premature birth. It will not cause your baby to weigh less at birth. What exercises can I do? Many exercises are safe for you to do during pregnancy. Do a variety of exercises that safely increase your heart and breathing rates and help you build and maintain muscle strength. Do exercises exactly as told by your health care provider. You may do these exercises:  Walking or hiking.  Swimming.  Water aerobics.  Riding a stationary bike.  Strength  training.  Modified yoga or Pilates. Tell your instructor that you are pregnant. Avoid overstretching, and avoid lying on your back for long periods of time.  Running or jogging. Only choose this type of exercise if you: ? Ran or jogged regularly before your pregnancy. ? Can run or jog and still talk in complete sentences. What exercises should I avoid? Depending on your level of fitness and whether you exercised regularly before your pregnancy, you may be told to limit high-intensity exercise. You can tell that you are exercising at a high  intensity if you are breathing much harder and faster and cannot hold a conversation while exercising. You must avoid:  Contact sports.  Activities that put you at risk for falling on or being hit in the belly, such as downhill skiing, water skiing, surfing, rock climbing, cycling, gymnastics, and horseback riding.  Scuba diving.  Skydiving.  Yoga or Pilates in a room that is heated to high temperatures.  Jogging or running, unless you ran or jogged regularly before your pregnancy. While jogging or running, you should always be able to talk in full sentences. Do not run or jog so fast that you are unable to have a conversation.  Do not exercise at more than 6,000 feet above sea level (high elevation) if you are not used to exercising at high elevation. How do I exercise in a safe way?   Avoid overheating. Do not exercise in very high temperatures.  Wear loose-fitting, breathable clothes.  Avoid dehydration. Drink enough water before, during, and after exercise to keep your urine pale yellow.  Avoid overstretching. Because of hormone changes during pregnancy, it is easy to overstretch muscles, tendons, and ligaments during pregnancy.  Start slowly and ask your health care provider to recommend the types of exercise that are safe for you.  Do not exercise to lose weight. Follow these instructions at home:  Exercise on most days or all days of the week. Try to exercise for 30 minutes a day, 5 days a week, unless your health care provider tells you not to.  If you actively exercised before your pregnancy and you are healthy, your health care provider may tell you to continue to do moderate to high-intensity exercise.  If you are just starting to exercise or did not exercise much before your pregnancy, your health care provider may tell you to do low to moderate-intensity exercise. Questions to ask your health care provider  Is exercise safe for me?  What are signs that I should  stop exercising?  Does my health condition mean that I should not exercise during pregnancy?  When should I avoid exercising during pregnancy? Stop exercising and contact a health care provider if: You have any unusual symptoms, such as:  Mild contractions of the uterus or cramps in the abdomen.  Dizziness that does not go away when you rest. Stop exercising and get help right away if: You have any unusual symptoms, such as:  Sudden, severe pain in your low back or your belly.  Mild contractions of the uterus or cramps in the abdomen that do not improve with rest and drinking fluids.  Chest pain.  Bleeding or fluid leaking from your vagina.  Shortness of breath. These symptoms may represent a serious problem that is an emergency. Do not wait to see if the symptoms will go away. Get medical help right away. Call your local emergency services (911 in the [place] Do not drive yourself to the hospital. Summary  Most  women should exercise regularly throughout pregnancy. In rare cases, women with certain medical conditions or complications may be asked to limit or avoid exercise during pregnancy.  Do not exercise to lose weight during pregnancy.  Your health care provider will tell you what level of physical activity is right for you.  Stop exercising and contact a health care provider if you have mild contractions of the uterus or cramps in the abdomen. Get help right away if these contractions or cramps do not improve with rest and drinking fluids.  Stop exercising and get help right away if you have sudden, severe pain in your low back or belly, chest pain, shortness of breath, or bleeding or leaking of fluid from your vagina. This information is not intended to replace advice given to you by your health care provider. Make sure you discuss any questions you have with your health care provider. Document Revised: 08/01/2018 Document Reviewed: 05/15/2018 Elsevier Patient Education   [place].   Common Medications Safe in Pregnancy  Acne:      Constipation:  Benzoyl Peroxide     Colace  Clindamycin      Dulcolax Suppository  Topica Erythromycin     Fibercon  Salicylic Acid      Metamucil         Miralax AVOID:        Senakot   Accutane    Cough:  Retin-A       Cough Drops  Tetracycline      Phenergan w/ Codeine if Rx  Minocycline      Robitussin (Plain & DM)  Antibiotics:     Crabs/Lice:  Ceclor       RID  Cephalosporins    AVOID:  E-Mycins      Kwell  Keflex  Macrobid/Macrodantin   Diarrhea:  Penicillin      Kao-Pectate  Zithromax      Imodium AD         PUSH FLUIDS AVOID:       Cipro     Fever:  Tetracycline      Tylenol (Regular or Extra  Minocycline       Strength)  Levaquin      Extra Strength-Do not          Exceed 8 tabs/24 hrs Caffeine:        <236m/day (equiv. To 1 cup of coffee or  approx. 3 12 oz sodas)         Gas: Cold/Hayfever:       Gas-X  Benadryl      Mylicon  Claritin       Phazyme  **Claritin-D        Chlor-Trimeton    Headaches:  Dimetapp      ASA-Free Excedrin  Drixoral-Non-Drowsy     Cold Compress  Mucinex (Guaifenasin)     Tylenol (Regular or Extra  Sudafed/Sudafed-12 Hour     Strength)  **Sudafed PE Pseudoephedrine   Tylenol Cold & Sinus     Vicks Vapor Rub  Zyrtec  **AVOID if Problems With Blood Pressure         Heartburn: Avoid lying down for at least 1 hour after meals  Aciphex      Maalox     Rash:  Milk of Magnesia     Benadryl    Mylanta       1% Hydrocortisone Cream  Pepcid  Pepcid Complete   Sleep Aids:  Prevacid      Ambien  Prilosec       Benadryl  Rolaids       Chamomile Tea  Tums (Limit 4/day)     Unisom         Tylenol PM         Warm milk-add vanilla or  Hemorrhoids:       Sugar for taste  Anusol/Anusol H.C.  (RX: Analapram 2.5%)  Sugar Substitutes:  Hydrocortisone OTC     Ok in moderation  Preparation H      Tucks        Vaseline lotion applied to tissue with  wiping    Herpes:     Throat:  Acyclovir      Oragel  Famvir  Valtrex     Vaccines:         Flu Shot Leg Cramps:       *Gardasil  Benadryl      Hepatitis A         Hepatitis B Nasal Spray:       Pneumovax  Saline Nasal Spray     Polio Booster         Tetanus Nausea:       Tuberculosis test or PPD  Vitamin B6 25 mg TID   AVOID:    Dramamine      *Gardasil  Emetrol       Live Poliovirus  Ginger Root 250 mg QID    MMR (measles, mumps &  High Complex Carbs @ Bedtime    rebella)  Sea Bands-Accupressure    Varicella (Chickenpox)  Unisom 1/2 tab TID     *No known complications           If received before Pain:         Known pregnancy;   Darvocet       Resume series after  Lortab        Delivery  Percocet    Yeast:   Tramadol      Femstat  Tylenol 3      Gyne-lotrimin  Ultram       Monistat  Vicodin           MISC:         All Sunscreens           Hair Coloring/highlights          Insect Repellant's          (Including DEET)         Mystic Tans   Second Trimester of Pregnancy  The second trimester is from week 14 through week 27 (month 4 through 6). This is often the time in pregnancy that you feel your best. Often times, morning sickness has lessened or quit. You may have more energy, and you may get hungry more often. Your unborn baby is growing rapidly. At the end of the sixth month, he or she is about 9 inches long and weighs about 1 pounds. You will likely feel the baby move between 18 and 20 weeks of pregnancy. Follow these instructions at home: Medicines  Take over-the-counter and prescription medicines only as told by your doctor. Some medicines are safe and some medicines are not safe during pregnancy.  Take a prenatal vitamin that contains at least 600 micrograms (mcg) of folic acid.  If you have trouble pooping (constipation), take medicine that will make your stool soft (stool softener) if your doctor approves. Eating and drinking   Eat regular, healthy  meals.  Avoid raw meat and uncooked  cheese.  If you get low calcium from the food you eat, talk to your doctor about taking a daily calcium supplement.  Avoid foods that are high in fat and sugars, such as fried and sweet foods.  If you feel sick to your stomach (nauseous) or throw up (vomit): ? Eat 4 or 5 small meals a day instead of 3 large meals. ? Try eating a few soda crackers. ? Drink liquids between meals instead of during meals.  To prevent constipation: ? Eat foods that are high in fiber, like fresh fruits and vegetables, whole grains, and beans. ? Drink enough fluids to keep your pee (urine) clear or pale yellow. Activity  Exercise only as told by your doctor. Stop exercising if you start to have cramps.  Do not exercise if it is too hot, too humid, or if you are in a place of great height (high altitude).  Avoid heavy lifting.  Wear low-heeled shoes. Sit and stand up straight.  You can continue to have sex unless your doctor tells you not to. Relieving pain and discomfort  Wear a good support bra if your breasts are tender.  Take warm water baths (sitz baths) to soothe pain or discomfort caused by hemorrhoids. Use hemorrhoid cream if your doctor approves.  Rest with your legs raised if you have leg cramps or low back pain.  If you develop puffy, bulging veins (varicose veins) in your legs: ? Wear support hose or compression stockings as told by your doctor. ? Raise (elevate) your feet for 15 minutes, 3-4 times a day. ? Limit salt in your food. Prenatal care  Write down your questions. Take them to your prenatal visits.  Keep all your prenatal visits as told by your doctor. This is important. Safety  Wear your seat belt when driving.  Make a list of emergency phone numbers, including numbers for family, friends, the hospital, and police and fire departments. General instructions  Ask your doctor about the right foods to eat or for help finding a counselor,  if you need these services.  Ask your doctor about local prenatal classes. Begin classes before month 6 of your pregnancy.  Do not use hot tubs, steam rooms, or saunas.  Do not douche or use tampons or scented sanitary pads.  Do not cross your legs for long periods of time.  Visit your dentist if you have not done so. Use a soft toothbrush to brush your teeth. Floss gently.  Avoid all smoking, herbs, and alcohol. Avoid drugs that are not approved by your doctor.  Do not use any products that contain nicotine or tobacco, such as cigarettes and e-cigarettes. If you need help quitting, ask your doctor.  Avoid cat litter boxes and soil used by cats. These carry germs that can cause birth defects in the baby and can cause a loss of your baby (miscarriage) or stillbirth. Contact a doctor if:  You have mild cramps or pressure in your lower belly.  You have pain when you pee (urinate).  You have bad smelling fluid coming from your vagina.  You continue to feel sick to your stomach (nauseous), throw up (vomit), or have watery poop (diarrhea).  You have a nagging pain in your belly area.  You feel dizzy. Get help right away if:  You have a fever.  You are leaking fluid from your vagina.  You have spotting or bleeding from your vagina.  You have severe belly cramping or pain.  You lose or gain  weight rapidly.  You have trouble catching your breath and have chest pain.  You notice sudden or extreme puffiness (swelling) of your face, hands, ankles, feet, or legs.  You have not felt the baby move in over an hour.  You have severe headaches that do not go away when you take medicine.  You have trouble seeing. Summary  The second trimester is from week 14 through week 27 (months 4 through 6). This is often the time in pregnancy that you feel your best.  To take care of yourself and your unborn baby, you will need to eat healthy meals, take medicines only if your doctor tells  you to do so, and do activities that are safe for you and your baby.  Call your doctor if you get sick or if you notice anything unusual about your pregnancy. Also, call your doctor if you need help with the right food to eat, or if you want to know what activities are safe for you. This information is not intended to replace advice given to you by your health care provider. Make sure you discuss any questions you have with your health care provider. Document Revised: 08/02/2018 Document Reviewed: 05/16/2016 Elsevier Patient Education  [place].

## 2020-02-17 LAB — CBC WITH DIFFERENTIAL/PLATELET
Basophils Absolute: 0 10*3/uL (ref 0.0–0.2)
Basos: 1 %
EOS (ABSOLUTE): 0.1 10*3/uL (ref 0.0–0.4)
Eos: 2 %
Hematocrit: 35.4 % (ref 34.0–46.6)
Hemoglobin: 12.3 g/dL (ref 11.1–15.9)
Immature Grans (Abs): 0 10*3/uL (ref 0.0–0.1)
Immature Granulocytes: 0 %
Lymphocytes Absolute: 1.4 10*3/uL (ref 0.7–3.1)
Lymphs: 21 %
MCH: 30 pg (ref 26.6–33.0)
MCHC: 34.7 g/dL (ref 31.5–35.7)
MCV: 86 fL (ref 79–97)
Monocytes Absolute: 0.6 10*3/uL (ref 0.1–0.9)
Monocytes: 8 %
Neutrophils Absolute: 4.5 10*3/uL (ref 1.4–7.0)
Neutrophils: 68 %
Platelets: 268 10*3/uL (ref 150–450)
RBC: 4.1 x10E6/uL (ref 3.77–5.28)
RDW: 12.8 % (ref 11.7–15.4)
WBC: 6.6 10*3/uL (ref 3.4–10.8)

## 2020-02-24 ENCOUNTER — Telehealth: Payer: Self-pay

## 2020-02-24 NOTE — Telephone Encounter (Signed)
mychart message sent

## 2020-03-15 ENCOUNTER — Other Ambulatory Visit: Payer: BC Managed Care – PPO

## 2020-03-15 ENCOUNTER — Ambulatory Visit (INDEPENDENT_AMBULATORY_CARE_PROVIDER_SITE_OTHER): Payer: BC Managed Care – PPO | Admitting: Certified Nurse Midwife

## 2020-03-15 ENCOUNTER — Other Ambulatory Visit: Payer: Self-pay

## 2020-03-15 VITALS — BP 102/73 | HR 91 | Wt 224.1 lb

## 2020-03-15 DIAGNOSIS — Z3A15 15 weeks gestation of pregnancy: Secondary | ICD-10-CM

## 2020-03-15 DIAGNOSIS — Z3402 Encounter for supervision of normal first pregnancy, second trimester: Secondary | ICD-10-CM

## 2020-03-15 LAB — POCT URINALYSIS DIPSTICK OB
Bilirubin, UA: NEGATIVE
Blood, UA: NEGATIVE
Glucose, UA: NEGATIVE
Leukocytes, UA: NEGATIVE
Nitrite, UA: NEGATIVE
Spec Grav, UA: 1.015 (ref 1.010–1.025)
Urobilinogen, UA: 0.2 E.U./dL
pH, UA: 7.5 (ref 5.0–8.0)

## 2020-03-15 NOTE — Patient Instructions (Signed)

## 2020-03-15 NOTE — Progress Notes (Signed)
ROB doing well. Early glucose today. Discussed results if elevated need for 3 hr test. She verbalizes understanding. Follow up 4 wks for u/s and ROB with Marcelino Duster.   Doreene Burke, CNM

## 2020-03-16 LAB — GLUCOSE, 1 HOUR GESTATIONAL: Gestational Diabetes Screen: 97 mg/dL (ref 65–139)

## 2020-04-15 ENCOUNTER — Ambulatory Visit (INDEPENDENT_AMBULATORY_CARE_PROVIDER_SITE_OTHER): Payer: BC Managed Care – PPO | Admitting: Certified Nurse Midwife

## 2020-04-15 ENCOUNTER — Encounter: Payer: Self-pay | Admitting: Certified Nurse Midwife

## 2020-04-15 ENCOUNTER — Other Ambulatory Visit: Payer: Self-pay

## 2020-04-15 ENCOUNTER — Other Ambulatory Visit: Payer: BC Managed Care – PPO

## 2020-04-15 VITALS — BP 114/82 | HR 79 | Wt 219.3 lb

## 2020-04-15 DIAGNOSIS — Z3402 Encounter for supervision of normal first pregnancy, second trimester: Secondary | ICD-10-CM

## 2020-04-15 DIAGNOSIS — O219 Vomiting of pregnancy, unspecified: Secondary | ICD-10-CM

## 2020-04-15 DIAGNOSIS — Z3A19 19 weeks gestation of pregnancy: Secondary | ICD-10-CM

## 2020-04-15 LAB — POCT URINALYSIS DIPSTICK OB
Bilirubin, UA: NEGATIVE
Blood, UA: NEGATIVE
Glucose, UA: NEGATIVE
Leukocytes, UA: NEGATIVE
Nitrite, UA: NEGATIVE
POC,PROTEIN,UA: NEGATIVE
Spec Grav, UA: 1.01 (ref 1.010–1.025)
Urobilinogen, UA: 0.2 E.U./dL
pH, UA: 7.5 (ref 5.0–8.0)

## 2020-04-15 MED ORDER — ONDANSETRON 4 MG PO TBDP: 4.0000 mg | ORAL_TABLET | Freq: Four times a day (QID) | ORAL | 1 refills | Status: DC | PRN

## 2020-04-15 NOTE — Progress Notes (Signed)
Would like flu vaccine at next visit.

## 2020-04-15 NOTE — Progress Notes (Signed)
ROB- Doing well overall. Reports random episodes of nausea with vomiting and six (6) pound weight loss. Not taking previous RX due to cost. Discussed home treatment measures, see AVS. Rx Zofran, Advised to continue taking folic acid and "Flintstones" vitamin. Questions addressed. Anticipatory guidance regarding course of prenatal care. Reviewed red flag symptoms and when to call.  RTC for ANATOMY ultrasound and results review on 12/30; RTC x 5 weeks with ANNIE for ROB or sooner if needed.  Juliann Pares, Student-Mid[employer]ife [employer] 04/15/20 12:16 PM

## 2020-04-15 NOTE — Patient Instructions (Signed)
WHAT OB PATIENTS CAN EXPECT   Confirmation of pregnancy and ultrasound ordered if medically indicated-[redacted] weeks gestation  New OB (NOB) intake with nurse and New OB (NOB) labs- [redacted] weeks gestation  New OB (NOB) physical examination with provider- 11/[redacted] weeks gestation  Flu vaccine-[redacted] weeks gestation  Anatomy scan-[redacted] weeks gestation  Glucose tolerance test, blood work to test for anemia, T-dap vaccine-[redacted] weeks gestation  Vaginal swabs/cultures-STD/Group B strep-[redacted] weeks gestation  Appointments every 4 weeks until 28 weeks  Every 2 weeks from 28 weeks until 36 weeks  Weekly visits from 36 weeks until delivery    Second Trimester of Pregnancy  The second trimester is from week 14 through week 27 (month 4 through 6). This is often the time in pregnancy that you feel your best. Often times, morning sickness has lessened or quit. You may have more energy, and you may get hungry more often. Your unborn baby is growing rapidly. At the end of the sixth month, he or she is about 9 inches long and weighs about 1 pounds. You will likely feel the baby move between 18 and 20 weeks of pregnancy. Follow these instructions at home: Medicines  Take over-the-counter and prescription medicines only as told by your doctor. Some medicines are safe and some medicines are not safe during pregnancy.  Take a prenatal vitamin that contains at least 600 micrograms (mcg) of folic acid.  If you have trouble pooping (constipation), take medicine that will make your stool soft (stool softener) if your doctor approves. Eating and drinking   Eat regular, healthy meals.  Avoid raw meat and uncooked cheese.  If you get low calcium from the food you eat, talk to your doctor about taking a daily calcium supplement.  Avoid foods that are high in fat and sugars, such as fried and sweet foods.  If you feel sick to your stomach (nauseous) or throw up (vomit): ? Eat 4 or 5 small meals a day instead of 3 large  meals. ? Try eating a few soda crackers. ? Drink liquids between meals instead of during meals.  To prevent constipation: ? Eat foods that are high in fiber, like fresh fruits and vegetables, whole grains, and beans. ? Drink enough fluids to keep your pee (urine) clear or pale yellow. Activity  Exercise only as told by your doctor. Stop exercising if you start to have cramps.  Do not exercise if it is too hot, too humid, or if you are in a place of great height (high altitude).  Avoid heavy lifting.  Wear low-heeled shoes. Sit and stand up straight.  You can continue to have sex unless your doctor tells you not to. Relieving pain and discomfort  Wear a good support bra if your breasts are tender.  Take warm water baths (sitz baths) to soothe pain or discomfort caused by hemorrhoids. Use hemorrhoid cream if your doctor approves.  Rest with your legs raised if you have leg cramps or low back pain.  If you develop puffy, bulging veins (varicose veins) in your legs: ? Wear support hose or compression stockings as told by your doctor. ? Raise (elevate) your feet for 15 minutes, 3-4 times a day. ? Limit salt in your food. Prenatal care  Write down your questions. Take them to your prenatal visits.  Keep all your prenatal visits as told by your doctor. This is important. Safety  Wear your seat belt when driving.  Make a list of emergency phone numbers, including numbers for family,  friends, the hospital, and police and fire departments. General instructions  Ask your doctor about the right foods to eat or for help finding a counselor, if you need these services.  Ask your doctor about local prenatal classes. Begin classes before month 6 of your pregnancy.  Do not use hot tubs, steam rooms, or saunas.  Do not douche or use tampons or scented sanitary pads.  Do not cross your legs for long periods of time.  Visit your dentist if you have not done so. Use a soft toothbrush  to brush your teeth. Floss gently.  Avoid all smoking, herbs, and alcohol. Avoid drugs that are not approved by your doctor.  Do not use any products that contain nicotine or tobacco, such as cigarettes and e-cigarettes. If you need help quitting, ask your doctor.  Avoid cat litter boxes and soil used by cats. These carry germs that can cause birth defects in the baby and can cause a loss of your baby (miscarriage) or stillbirth. Contact a doctor if:  You have mild cramps or pressure in your lower belly.  You have pain when you pee (urinate).  You have bad smelling fluid coming from your vagina.  You continue to feel sick to your stomach (nauseous), throw up (vomit), or have watery poop (diarrhea).  You have a nagging pain in your belly area.  You feel dizzy. Get help right away if:  You have a fever.  You are leaking fluid from your vagina.  You have spotting or bleeding from your vagina.  You have severe belly cramping or pain.  You lose or gain weight rapidly.  You have trouble catching your breath and have chest pain.  You notice sudden or extreme puffiness (swelling) of your face, hands, ankles, feet, or legs.  You have not felt the baby move in over an hour.  You have severe headaches that do not go away when you take medicine.  You have trouble seeing. Summary  The second trimester is from week 14 through week 27 (months 4 through 6). This is often the time in pregnancy that you feel your best.  To take care of yourself and your unborn baby, you will need to eat healthy meals, take medicines only if your doctor tells you to do so, and do activities that are safe for you and your baby.  Call your doctor if you get sick or if you notice anything unusual about your pregnancy. Also, call your doctor if you need help with the right food to eat, or if you want to know what activities are safe for you. This information is not intended to replace advice given to you by  your health care provider. Make sure you discuss any questions you have with your health care provider. Document Revised: 08/02/2018 Document Reviewed: 05/16/2016 Elsevier Patient Education  2020 Elsevier Inc.   Ondansetron oral dissolving tablet What is this medicine? ONDANSETRON (on DAN se tron) is used to treat nausea and vomiting caused by chemotherapy. It is also used to prevent or treat nausea and vomiting after surgery. This medicine may be used for other purposes; ask your health care provider or pharmacist if you have questions. COMMON BRAND NAME(S): Zofran ODT What should I tell my health care provider before I take this medicine? They need to know if you have any of these conditions:  heart disease  history of irregular heartbeat  liver disease  low levels of magnesium or potassium in the blood  an unusual  or allergic reaction to ondansetron, granisetron, other medicines, foods, dyes, or preservatives  pregnant or trying to get pregnant  breast-feeding How should I use this medicine? These tablets are made to dissolve in the mouth. Do not try to push the tablet through the foil backing. With dry hands, peel away the foil backing and gently remove the tablet. Place the tablet in the mouth and allow it to dissolve, then swallow. While you may take these tablets with water, it is not necessary to do so. Talk to your pediatrician regarding the use of this medicine in children. Special care may be needed. Overdosage: If you think you have taken too much of this medicine contact a poison control center or emergency room at once. NOTE: This medicine is only for you. Do not share this medicine with others. What if I miss a dose? If you miss a dose, take it as soon as you can. If it is almost time for your next dose, take only that dose. Do not take double or extra doses. What may interact with this medicine? Do not take this medicine with any of the following  medications:  apomorphine  certain medicines for fungal infections like fluconazole, itraconazole, ketoconazole, posaconazole, voriconazole  cisapride  dronedarone  pimozide  thioridazine This medicine may also interact with the following medications:  carbamazepine  certain medicines for depression, anxiety, or psychotic disturbances  fentanyl  linezolid  MAOIs like Carbex, Eldepryl, Marplan, Nardil, and Parnate  methylene blue (injected into a vein)  other medicines that prolong the QT interval (cause an abnormal heart rhythm) like dofetilide, ziprasidone  phenytoin  rifampicin  tramadol This list may not describe all possible interactions. Give your health care provider a list of all the medicines, herbs, non-prescription drugs, or dietary supplements you use. Also tell them if you smoke, drink alcohol, or use illegal drugs. Some items may interact with your medicine. What should I watch for while using this medicine? Check with your doctor or health care professional as soon as you can if you have any sign of an allergic reaction. What side effects may I notice from receiving this medicine? Side effects that you should report to your doctor or health care professional as soon as possible:  allergic reactions like skin rash, itching or hives, swelling of the face, lips, or tongue  breathing problems  confusion  dizziness  fast or irregular heartbeat  feeling faint or lightheaded, falls  fever and chills  loss of balance or coordination  seizures  sweating  swelling of the hands and feet  tightness in the chest  tremors  unusually weak or tired Side effects that usually do not require medical attention (report to your doctor or health care professional if they continue or are bothersome):  constipation or diarrhea  headache This list may not describe all possible side effects. Call your doctor for medical advice about side effects. You may  report side effects to FDA at 1-800-FDA-1088. Where should I keep my medicine? Keep out of the reach of children. Store between 2 and 30 degrees C (36 and 86 degrees F). Throw away any unused medicine after the expiration date. NOTE: This sheet is a summary. It may not cover all possible information. If you have questions about this medicine, talk to your doctor, pharmacist, or health care provider.  2020 Elsevier/Gold Standard (2018-04-02 07:14:10)

## 2020-04-15 NOTE — Progress Notes (Signed)
I have seen, interviewed, and examined the patient in conjunction with the [facility] [employer] [facility] and affirm the diagnosis and management plan.   Gunnar Bulla, CNM [facility], [facility] 04/15/20 12:30 PM

## 2020-04-22 ENCOUNTER — Encounter: Payer: Self-pay | Admitting: Certified Nurse Midwife

## 2020-04-22 ENCOUNTER — Ambulatory Visit (INDEPENDENT_AMBULATORY_CARE_PROVIDER_SITE_OTHER): Payer: BC Managed Care – PPO

## 2020-04-22 ENCOUNTER — Ambulatory Visit (INDEPENDENT_AMBULATORY_CARE_PROVIDER_SITE_OTHER): Payer: BC Managed Care – PPO | Admitting: Certified Nurse Midwife

## 2020-04-22 ENCOUNTER — Other Ambulatory Visit: Payer: Self-pay

## 2020-04-22 VITALS — BP 96/67 | HR 81 | Wt 219.2 lb

## 2020-04-22 DIAGNOSIS — Z3402 Encounter for supervision of normal first pregnancy, second trimester: Secondary | ICD-10-CM

## 2020-04-22 DIAGNOSIS — Z3A20 20 weeks gestation of pregnancy: Secondary | ICD-10-CM

## 2020-04-22 LAB — POCT URINALYSIS DIPSTICK OB
Bilirubin, UA: NEGATIVE
Blood, UA: NEGATIVE
Glucose, UA: NEGATIVE
Ketones, UA: NEGATIVE
Leukocytes, UA: NEGATIVE
Nitrite, UA: NEGATIVE
POC,PROTEIN,UA: NEGATIVE
Spec Grav, UA: 1.01 (ref 1.010–1.025)
Urobilinogen, UA: 0.2 E.U./dL
pH, UA: 7 (ref 5.0–8.0)

## 2020-04-22 NOTE — Patient Instructions (Signed)
WHAT OB PATIENTS CAN EXPECT   Confirmation of pregnancy and ultrasound ordered if medically indicated-[redacted] weeks gestation  New OB (NOB) intake with nurse and New OB (NOB) labs- [redacted] weeks gestation  New OB (NOB) physical examination with provider- 11/[redacted] weeks gestation  Flu vaccine-[redacted] weeks gestation  Anatomy scan-[redacted] weeks gestation  Glucose tolerance test, blood work to test for anemia, T-dap vaccine-[redacted] weeks gestation  Vaginal swabs/cultures-STD/Group B strep-[redacted] weeks gestation  Appointments every 4 weeks until 28 weeks  Every 2 weeks from 28 weeks until 36 weeks  Weekly visits from 36 weeks until delivery    Back Pain in Pregnancy Back pain during pregnancy is common. Back pain may be caused by several factors that are related to changes during your pregnancy. Follow these instructions at home: Managing pain, stiffness, and swelling      If directed, for sudden (acute) back pain, put ice on the painful area. ? Put ice in a plastic bag. ? Place a towel between your skin and the bag. ? Leave the ice on for 20 minutes, 2-3 times per day.  If directed, apply heat to the affected area before you exercise. Use the heat source that your health care provider recommends, such as a moist heat pack or a heating pad. ? Place a towel between your skin and the heat source. ? Leave the heat on for 20-30 minutes. ? Remove the heat if your skin turns bright red. This is especially important if you are unable to feel pain, heat, or cold. You may have a greater risk of getting burned.  If directed, massage the affected area. Activity  Exercise as told by your health care provider. Gentle exercise is the best way to prevent or manage back pain.  Listen to your body when lifting. If lifting hurts, ask for help or bend your knees. This uses your leg muscles instead of your back muscles.  Squat down when picking up something from the floor. Do not bend over.  Only use bed rest for short  periods as told by your health care provider. Bed rest should only be used for the most severe episodes of back pain. Standing, sitting, and lying down  Do not stand in one place for long periods of time.  Use good posture when sitting. Make sure your head rests over your shoulders and is not hanging forward. Use a pillow on your lower back if necessary.  Try sleeping on your side, preferably the left side, with a pregnancy support pillow or 1-2 regular pillows between your legs. ? If you have back pain after a night's rest, your bed may be too soft. ? A firm mattress may provide more support for your back during pregnancy. General instructions  Do not wear high heels.  Eat a healthy diet. Try to gain weight within your health care provider's recommendations.  Use a maternity girdle, elastic sling, or back brace as told by your health care provider.  Take over-the-counter and prescription medicines only as told by your health care provider.  Work with a physical therapist or massage therapist to find ways to manage back pain. Acupuncture or massage therapy may be helpful.  Keep all follow-up visits as told by your health care provider. This is important. Contact a health care provider if:  Your back pain interferes with your daily activities.  You have increasing pain in other parts of your body. Get help right away if:  You develop numbness, tingling, weakness, or problems with the  use of your arms or legs.  You develop severe back pain that is not controlled with medicine.  You have a change in bowel or bladder control.  You develop shortness of breath, dizziness, or you faint.  You develop nausea, vomiting, or sweating.  You have back pain that is a rhythmic, cramping pain similar to labor pains. Labor pain is usually 1-2 minutes apart, lasts for about 1 minute, and involves a bearing down feeling or pressure in your pelvis.  You have back pain and your water breaks or  you have vaginal bleeding.  You have back pain or numbness that travels down your leg.  Your back pain developed after you fell.  You develop pain on one side of your back.  You see blood in your urine.  You develop skin blisters in the area of your back pain. Summary  Back pain may be caused by several factors that are related to changes during your pregnancy.  Follow instructions as told by your health care provider for managing pain, stiffness, and swelling.  Exercise as told by your health care provider. Gentle exercise is the best way to prevent or manage back pain.  Take over-the-counter and prescription medicines only as told by your health care provider.  Keep all follow-up visits as told by your health care provider. This is important. This information is not intended to replace advice given to you by your health care provider. Make sure you discuss any questions you have with your health care provider. Document Revised: 07/30/2018 Document Reviewed: 09/26/2017 Elsevier Patient Education  2020 Elsevier Inc.   Round Ligament Pain  The round ligament is a cord of muscle and tissue that helps support the uterus. It can become a source of pain during pregnancy if it becomes stretched or twisted as the baby grows. The pain usually begins in the second trimester (13-28 weeks) of pregnancy, and it can come and go until the baby is delivered. It is not a serious problem, and it does not cause harm to the baby. Round ligament pain is usually a short, sharp, and pinching pain, but it can also be a dull, lingering, and aching pain. The pain is felt in the lower side of the abdomen or in the groin. It usually starts deep in the groin and moves up to the outside of the hip area. The pain may occur when you:  Suddenly change position, such as quickly going from a sitting to standing position.  Roll over in bed.  Cough or sneeze.  Do physical activity. Follow these instructions at  home:   Watch your condition for any changes.  When the pain starts, relax. Then try any of these methods to help with the pain: ? Sitting down. ? Flexing your knees up to your abdomen. ? Lying on your side with one pillow under your abdomen and another pillow between your legs. ? Sitting in a warm bath for 15-20 minutes or until the pain goes away.  Take over-the-counter and prescription medicines only as told by your health care provider.  Move slowly when you sit down or stand up.  Avoid long walks if they cause pain.  Stop or reduce your physical activities if they cause pain.  Keep all follow-up visits as told by your health care provider. This is important. Contact a health care provider if:  Your pain does not go away with treatment.  You feel pain in your back that you did not have before.  Your  medicine is not helping. Get help right away if:  You have a fever or chills.  You develop uterine contractions.  You have vaginal bleeding.  You have nausea or vomiting.  You have diarrhea.  You have pain when you urinate. Summary  Round ligament pain is felt in the lower abdomen or groin. It is usually a short, sharp, and pinching pain. It can also be a dull, lingering, and aching pain.  This pain usually begins in the second trimester (13-28 weeks). It occurs because the uterus is stretching with the growing baby, and it is not harmful to the baby.  You may notice the pain when you suddenly change position, when you cough or sneeze, or during physical activity.  Relaxing, flexing your knees to your abdomen, lying on one side, or taking a warm bath may help to get rid of the pain.  Get help from your health care provider if the pain does not go away or if you have vaginal bleeding, nausea, vomiting, diarrhea, or painful urination. This information is not intended to replace advice given to you by your health care provider. Make sure you discuss any questions you  have with your health care provider. Document Revised: 09/26/2017 Document Reviewed: 09/26/2017 Elsevier Patient Education  2020 [employer].

## 2020-04-22 NOTE — Progress Notes (Signed)
ROB-Doing well, no questions or concerns. Anatomy scan today complete and normal, see below. Naming son Aggie Hacker. Anticipatory guidance regarding course of prenatal care. Reviewed red flag symptoms and when to call. RTC x 4 weeks for ROB or sooner if needed.   ULTRASOUND REPORT  Location: [facility] Date of Service: 04/22/2020   Indications:Anatomy Ultrasound Findings:  Singleton intrauterine pregnancy is visualized with FHR at 157 BPM. Biometrics give an (U/S) Gestational age of [redacted]w[redacted]d and an (U/S) EDD of 09/04/2020; this correlates with the clinically established Estimated Date of Delivery: 09/06/20  Fetal presentation is transverse.  EFW: 351 g ( 12 oz).  Placenta: anterior. Grade: 1 AFI: subjectively normal.  Anatomic survey is complete and normal; Gender - female.    Right Ovary is normal in appearance. Left Ovary is normal appearance. Survey of the adnexa demonstrates no adnexal masses. There is no free peritoneal fluid in the cul de sac.  Impression: 1. [redacted]w[redacted]d Viable Singleton Intrauterine pregnancy by U/S. 2. (U/S) EDD is consistent with Clinically established Estimated Date of Delivery: 09/06/20 . 3. Normal Anatomy Scan  Recommendations: 1.Clinical correlation with the patient's History and Physical Exam.

## 2020-04-24 NOTE — L&D Delivery Note (Signed)
Delivery Summary for Brenda Mccoy  Labor Events:   Preterm labor: No data found  Rupture date: 09/04/2020  Rupture time: 8:10 AM  Rupture type: Spontaneous Intact  Fluid Color: Clear  Induction: No data found  Augmentation: No data found  Complications: No data found  Cervical ripening: No data found No data found   No data found     Delivery:   Episiotomy: No data found  Lacerations: No data found  Repair suture: No data found  Repair # of packets: No data found  Blood loss (ml): No data found   Information for the patient's newborn:  [name], [name] [[id number]]    Delivery 09/05/2020 7:08 AM by  C-Section, Low Transverse Sex:  female Gestational Age: [redacted]w[redacted]d Delivery Clinician:   Living?:         APGARS  One minute Five minutes Ten minutes  Skin color:        Heart rate:        Grimace:        Muscle tone:        Breathing:        Totals: 7  9      Presentation/position:      Resuscitation:   Cord information:    Disposition of cord blood:     Blood gases sent?  Complications:   Placenta: Delivered:       appearance Newborn Measurements: Weight: 6 lb 10.2 oz (3010 g)  Height: 20.47"  Head circumference:    Chest circumference:    Other providers:    Additional  information: Forceps:   Vacuum:   Breech:   Observed anomalies       See Dr. Oretha Milch operative note for details of C-section delivery.    Hildred Laser, MD [facility]

## 2020-04-27 ENCOUNTER — Encounter: Payer: BC Managed Care – PPO | Admitting: Certified Nurse Midwife

## 2020-04-27 ENCOUNTER — Telehealth: Payer: Self-pay

## 2020-04-28 ENCOUNTER — Other Ambulatory Visit (HOSPITAL_COMMUNITY)
Admission: RE | Admit: 2020-04-28 | Discharge: 2020-04-28 | Disposition: A | Discharge status: Home / Self Care | Payer: BC Managed Care – PPO | Source: Ambulatory Visit | Attending: Certified Nurse Midwife | Admitting: Certified Nurse Midwife

## 2020-04-28 ENCOUNTER — Ambulatory Visit (INDEPENDENT_AMBULATORY_CARE_PROVIDER_SITE_OTHER): Payer: BC Managed Care – PPO | Admitting: Certified Nurse Midwife

## 2020-04-28 ENCOUNTER — Other Ambulatory Visit: Payer: Self-pay

## 2020-04-28 VITALS — BP 100/66 | HR 102 | Wt 219.5 lb

## 2020-04-28 DIAGNOSIS — N949 Unspecified condition associated with female genital organs and menstrual cycle: Secondary | ICD-10-CM | POA: Insufficient documentation

## 2020-04-28 DIAGNOSIS — Z3A21 21 weeks gestation of pregnancy: Secondary | ICD-10-CM | POA: Diagnosis present

## 2020-04-28 DIAGNOSIS — N898 Other specified noninflammatory disorders of vagina: Secondary | ICD-10-CM | POA: Diagnosis present

## 2020-04-28 DIAGNOSIS — O26892 Other specified pregnancy related conditions, second trimester: Secondary | ICD-10-CM | POA: Insufficient documentation

## 2020-04-28 DIAGNOSIS — N9489 Other specified conditions associated with female genital organs and menstrual cycle: Secondary | ICD-10-CM

## 2020-04-28 NOTE — Progress Notes (Signed)
Pt present today for problem visit. C/o vaginal discharge with itching and burning. She would like to be tested. State she has not taken anything at home as she has allergy to whatever is in the monistat. She can take diflucan .Swab collected. Will follow up with results. ROB as scheduled on 26th.  Doreene Burke, CNM

## 2020-04-28 NOTE — Patient Instructions (Signed)

## 2020-04-29 LAB — CERVICOVAGINAL ANCILLARY ONLY
Bacterial Vaginitis (gardnerella): NEGATIVE
Candida Glabrata: NEGATIVE
Candida Vaginitis: NEGATIVE
Chlamydia: NEGATIVE
Comment: NEGATIVE
Comment: NEGATIVE
Comment: NEGATIVE
Comment: NEGATIVE
Comment: NEGATIVE
Comment: NORMAL
Neisseria Gonorrhea: NEGATIVE
Trichomonas: NEGATIVE

## 2020-05-18 ENCOUNTER — Other Ambulatory Visit: Payer: Self-pay

## 2020-05-18 ENCOUNTER — Ambulatory Visit (INDEPENDENT_AMBULATORY_CARE_PROVIDER_SITE_OTHER): Payer: BC Managed Care – PPO | Admitting: Certified Nurse Midwife

## 2020-05-18 VITALS — BP 108/76 | HR 106 | Wt 222.1 lb

## 2020-05-18 DIAGNOSIS — Z3A24 24 weeks gestation of pregnancy: Secondary | ICD-10-CM

## 2020-05-18 NOTE — Progress Notes (Signed)
ROB doing well, feels fetal movement. Anticipatory guidance 28 wk visit. Reviewed round ligament pain. She verbalizes and agrees to plan. Follow up 4 wk with Marcelino Duster for ROB.  Doreene Burke, CNM

## 2020-05-18 NOTE — Patient Instructions (Signed)
Oral Glucose Tolerance Test During Pregnancy Why am I having this test? The oral glucose tolerance test (OGTT) is done to check how your body processes blood sugar (glucose). This is one of several tests used to diagnose diabetes that develops during pregnancy (gestational diabetes mellitus). Gestational diabetes is a short-term form of diabetes that some women develop while they are pregnant. It usually occurs during the second trimester of pregnancy and goes away after delivery. Testing, or screening, for gestational diabetes usually occurs at weeks 24-28 of pregnancy. You may have the OGTT test after having a 1-hour glucose screening test if the results from that test indicate that you may have gestational diabetes. This test may also be needed if:  You have a history of gestational diabetes.  There is a history of giving birth to very large babies or of losing pregnancies (having stillbirths).  You have signs and symptoms of diabetes, such as: ? Changes in your eyesight. ? Tingling or numbness in your hands or feet. ? Changes in hunger, thirst, and urination, and these are not explained by your pregnancy. What is being tested? This test measures the amount of glucose in your blood at different times during a period of 3 hours. This shows how well your body can process glucose. What kind of sample is taken? Blood samples are required for this test. They are usually collected by inserting a needle into a blood vessel.   How do I prepare for this test?  For 3 days before your test, eat normally. Have plenty of carbohydrate-rich foods.  Follow instructions from your health care provider about: ? Eating or drinking restrictions on the day of the test. You may be asked not to eat or drink anything other than water (to fast) starting 8-10 hours before the test. ? Changing or stopping your regular medicines. Some medicines may interfere with this test. Tell a health care provider about:  All  medicines you are taking, including vitamins, herbs, eye drops, creams, and over-the-counter medicines.  Any blood disorders you have.  Any surgeries you have had.  Any medical conditions you have. What happens during the test? First, your blood glucose will be measured. This is referred to as your fasting blood glucose because you fasted before the test. Then, you will drink a glucose solution that contains a certain amount of glucose. Your blood glucose will be measured again 1, 2, and 3 hours after you drink the solution. This test takes about 3 hours to complete. You will need to stay at the testing location during this time. During the testing period:  Do not eat or drink anything other than the glucose solution.  Do not exercise.  Do not use any products that contain nicotine or tobacco, such as cigarettes, e-cigarettes, and chewing tobacco. These can affect your test results. If you need help quitting, ask your health care provider. The testing procedure may vary among health care providers and hospitals. How are the results reported? Your results will be reported as milligrams of glucose per deciliter of blood (mg/dL) or millimoles per liter (mmol/L). There is more than one source for screening and diagnosis reference values used to diagnose gestational diabetes. Your health care provider will compare your results to normal values that were established after testing a large group of people (reference values). Reference values may vary among labs and hospitals. For this test ([facility]-Coustan), reference values are:  Fasting: 95 mg/dL (5.3 mmol/L).  1 hour: 180 mg/dL (10.0 mmol/L).  2 hour:   155 mg/dL (8.6 mmol/L).  3 hour: 140 mg/dL (7.8 mmol/L). What do the results mean? Results below the reference values are considered normal. If two or more of your blood glucose levels are at or above the reference values, you may be diagnosed with gestational diabetes. If only one level is  high, your health care provider may suggest repeat testing or other tests to confirm a diagnosis. Talk with your health care provider about what your results mean. Questions to ask your health care provider Ask your health care provider, or the department that is doing the test:  When will my results be ready?  How will I get my results?  What are my treatment options?  What other tests do I need?  What are my next steps? Summary  The oral glucose tolerance test (OGTT) is one of several tests used to diagnose diabetes that develops during pregnancy (gestational diabetes mellitus). Gestational diabetes is a short-term form of diabetes that some women develop while they are pregnant.  You may have the OGTT test after having a 1-hour glucose screening test if the results from that test show that you may have gestational diabetes. You may also have this test if you have any symptoms or risk factors for this type of diabetes.  Talk with your health care provider about what your results mean. This information is not intended to replace advice given to you by your health care provider. Make sure you discuss any questions you have with your health care provider. Document Revised: 09/18/2019 Document Reviewed: 09/18/2019 Elsevier Patient Education  2021 [employer]  

## 2020-05-19 ENCOUNTER — Encounter: Payer: BC Managed Care – PPO | Admitting: Certified Nurse Midwife

## 2020-05-25 NOTE — Telephone Encounter (Signed)
Error

## 2020-05-26 ENCOUNTER — Encounter (HOSPITAL_COMMUNITY): Payer: Self-pay | Admitting: Obstetrics and Gynecology

## 2020-05-26 ENCOUNTER — Inpatient Hospital Stay (HOSPITAL_COMMUNITY)
Admission: AD | Admit: 2020-05-26 | Discharge: 2020-05-26 | Disposition: A | Discharge status: Home / Self Care | Payer: BC Managed Care – PPO | Attending: Obstetrics and Gynecology | Admitting: Obstetrics and Gynecology

## 2020-05-26 ENCOUNTER — Other Ambulatory Visit: Payer: Self-pay

## 2020-05-26 DIAGNOSIS — Z3A25 25 weeks gestation of pregnancy: Secondary | ICD-10-CM | POA: Diagnosis not present

## 2020-05-26 DIAGNOSIS — Z79899 Other long term (current) drug therapy: Secondary | ICD-10-CM | POA: Insufficient documentation

## 2020-05-26 DIAGNOSIS — R519 Headache, unspecified: Secondary | ICD-10-CM | POA: Insufficient documentation

## 2020-05-26 DIAGNOSIS — O368120 Decreased fetal movements, second trimester, not applicable or unspecified: Secondary | ICD-10-CM

## 2020-05-26 DIAGNOSIS — Z7982 Long term (current) use of aspirin: Secondary | ICD-10-CM | POA: Diagnosis not present

## 2020-05-26 DIAGNOSIS — O26892 Other specified pregnancy related conditions, second trimester: Secondary | ICD-10-CM | POA: Insufficient documentation

## 2020-05-26 LAB — CBC
HCT: 32.2 % — ABNORMAL LOW (ref 36.0–46.0)
Hemoglobin: 10.5 g/dL — ABNORMAL LOW (ref 12.0–15.0)
MCH: 29.6 pg (ref 26.0–34.0)
MCHC: 32.6 g/dL (ref 30.0–36.0)
MCV: 90.7 fL (ref 80.0–100.0)
Platelets: 206 10*3/uL (ref 150–400)
RBC: 3.55 MIL/uL — ABNORMAL LOW (ref 3.87–5.11)
RDW: 13.5 % (ref 11.5–15.5)
WBC: 8.3 10*3/uL (ref 4.0–10.5)
nRBC: 0 % (ref 0.0–0.2)

## 2020-05-26 LAB — COMPREHENSIVE METABOLIC PANEL
ALT: 23 U/L (ref 0–44)
AST: 23 U/L (ref 15–41)
Albumin: 3.1 g/dL — ABNORMAL LOW (ref 3.5–5.0)
Alkaline Phosphatase: 66 U/L (ref 38–126)
Anion gap: 10 (ref 5–15)
BUN: <5 mg/dL — ABNORMAL LOW (ref 6–20)
CO2: 21 mmol/L — ABNORMAL LOW (ref 22–32)
Calcium: 9.6 mg/dL (ref 8.9–10.3)
Chloride: 106 mmol/L (ref 98–111)
Creatinine, Ser: 0.57 mg/dL (ref 0.44–1.00)
GFR, Estimated: >60 mL/min (ref >60)
Glucose, Bld: 102 mg/dL — ABNORMAL HIGH (ref 70–99)
Potassium: 3.8 mmol/L (ref 3.5–5.1)
Sodium: 137 mmol/L (ref 135–145)
Total Bilirubin: 0.3 mg/dL (ref 0.3–1.2)
Total Protein: 6.1 g/dL — ABNORMAL LOW (ref 6.5–8.1)

## 2020-05-26 LAB — URINALYSIS, ROUTINE W REFLEX MICROSCOPIC
Bilirubin Urine: NEGATIVE
Glucose, UA: NEGATIVE mg/dL
Hgb urine dipstick: NEGATIVE
Ketones, ur: NEGATIVE mg/dL
Leukocytes,Ua: NEGATIVE
Nitrite: NEGATIVE
Protein, ur: NEGATIVE mg/dL
Specific Gravity, Urine: 1.01 (ref 1.005–1.030)
pH: 7 (ref 5.0–8.0)

## 2020-05-26 MED ORDER — FERROUS SULFATE 325 (65 FE) MG PO TABS: 325.0000 mg | ORAL_TABLET | ORAL | 0 refills | Status: DC

## 2020-05-26 NOTE — MAU Note (Signed)
Brenda Mccoy is a 25 y.o. at [redacted]w[redacted]d here in MAU reporting: has been waking up with headaches on the right side of her head. Today headache is present bilaterally but only when she is walking, when she is sitting she has no pain. DFM today.  Onset of complaint: ongoing  Pain score: 5/10  Vitals:   05/26/20 1142  BP: 126/76  Pulse: 98  Resp: 18  Temp: 98.6 F (37 C)  SpO2: 99%     FHT:165  Lab orders placed from triage: UA

## 2020-05-26 NOTE — MAU Provider Note (Addendum)
Chief Complaint: Headache and Decreased Fetal Movement   Event Date/Time   First Provider Initiated Contact with Patient 05/26/20 1242      SUBJECTIVE HPI: Brenda Mccoy is a 25 y.o. G1P0 at [redacted]w[redacted]d by who presents to maternity admissions reporting R sided frontal headache that occasionally radiates to behind her R eye, but is not currently experiencing it. The headache started in the morning x2 days ago. It is aggravated by walking and is better when sitting down and relaxing. The headache is relieved with Tylenol. She rates her headache pain this morning 5/10. She denies dizziness, vision changesvaginal bleeding, vaginal itching/burning, urinary symptoms, n/v, or fever/chills. She has no known complications with this pregnancy including GHTN, GDM, or infections.   Patient reports decreased fetal movement last night and this morning, but when she was placed on the fetal monitor she was able to feel the baby again, movement back to baseline.   History reviewed. No pertinent past medical history. Past Surgical History:  Procedure Laterality Date  . no surgical history    . ROOT CANAL    . WISDOM TOOTH EXTRACTION     1   Social History   Socioeconomic History  . Marital status: Single    Spouse name: Not on file  . Number of children: Not on file  . Years of education: Not on file  . Highest education level: Not on file  Occupational History  . Not on file  Tobacco Use  . Smoking status: Never Smoker  . Smokeless tobacco: Never Used  Vaping Use  . Vaping Use: Never used  Substance and Sexual Activity  . Alcohol use: Not Currently  . Drug use: Never  . Sexual activity: Yes  Other Topics Concern  . Not on file  Social History Narrative  . Not on file   Social Determinants of Health   Financial Resource Strain: Not on file  Food Insecurity: Not on file  Transportation Needs: Not on file  Physical Activity: Not on file  Stress: Not on file  Social Connections: Not on file   Intimate Partner Violence: Not on file   No current facility-administered medications on file prior to encounter.   Current Outpatient Medications on File Prior to Encounter  Medication Sig Dispense Refill  . aspirin EC 81 MG tablet Take 1 tablet (81 mg total) by mouth daily. Take after 12 weeks for prevention of preeclampssia later in pregnancy 300 tablet 2  . folic acid (FOLVITE) 1 MG tablet Take 1 tablet (1 mg total) by mouth daily. 30 tablet 10  . ondansetron (ZOFRAN ODT) 4 MG disintegrating tablet Take 1 tablet (4 mg total) by mouth every 6 (six) hours as needed for nausea. 30 tablet 1  . Pediatric Multiple Vit-C-FA (FLINSTONES GUMMIES OMEGA-3 DHA PO) Take by mouth.     Allergies  Allergen Reactions  . Miconazole     ROS:  Review of Systems  Constitutional: Negative for chills, fatigue and fever.  Eyes: Negative for visual disturbance.  Respiratory: Negative for shortness of breath.   Cardiovascular: Negative for chest pain and leg swelling.  Gastrointestinal: Negative for abdominal pain, diarrhea, nausea and vomiting.  Genitourinary: Negative for frequency, urgency, vaginal bleeding and vaginal discharge.  Musculoskeletal: Negative for neck pain and neck stiffness.  Neurological: Positive for headaches. Negative for dizziness, syncope and light-headedness.    I have reviewed patient's Past Medical Hx, Surgical Hx, Family Hx, Social Hx, medications and allergies.   Physical Exam   Patient Vitals for  the past 24 hrs:  BP Temp Temp src Pulse Resp SpO2 Height Weight  05/26/20 1200 - - - - - 99 % - -  05/26/20 1155 133/84 98.2 F (36.8 C) Oral (!) 110 - 99 % - -  05/26/20 1142 126/76 98.6 F (37 C) Oral 98 18 99 % - -  05/26/20 1137 - - - - - - 5\' 4"  (1.626 m) 101.7 kg   Constitutional: Well-developed, well-nourished female in no acute distress.  Cardiovascular: normal rate Respiratory: normal effort GI: Abd soft, non-tender. Pos BS x 4 MS: Extremities nontender, no  edema, normal ROM Neurologic: Alert and oriented x 4.  GU: Neg CVAT.  Fetal tracing: baseline 150bpm / accelerations appropriate for gestational age / no decels / no contractions noted on toco or palpation.   Patient reports decreased fetal movement last night and this morning. However, patient states that when she was placed on the fetal monitor, she is now feels fetal movement as expected.   LAB RESULTS Results for orders placed or performed during the hospital encounter of 05/26/20 (from the past 24 hour(s))  Urinalysis, Routine w reflex microscopic Urine, Clean Catch     Status: Abnormal   Collection Time: 05/26/20 12:02 PM  Result Value Ref Range   Color, Urine YELLOW YELLOW   APPearance HAZY (A) CLEAR   Specific Gravity, Urine 1.010 1.005 - 1.030   pH 7.0 5.0 - 8.0   Glucose, UA NEGATIVE NEGATIVE mg/dL   Hgb urine dipstick NEGATIVE NEGATIVE   Bilirubin Urine NEGATIVE NEGATIVE   Ketones, ur NEGATIVE NEGATIVE mg/dL   Protein, ur NEGATIVE NEGATIVE mg/dL   Nitrite NEGATIVE NEGATIVE   Leukocytes,Ua NEGATIVE NEGATIVE  CBC     Status: Abnormal   Collection Time: 05/26/20 12:48 PM  Result Value Ref Range   WBC 8.3 4.0 - 10.5 K/uL   RBC 3.55 (L) 3.87 - 5.11 MIL/uL   Hemoglobin 10.5 (L) 12.0 - 15.0 g/dL   HCT 07/24/20 (L) 33.2 - 95.1 %   MCV 90.7 80.0 - 100.0 fL   MCH 29.6 26.0 - 34.0 pg   MCHC 32.6 30.0 - 36.0 g/dL   RDW 88.4 16.6 - 06.3 %   Platelets 206 150 - 400 K/uL   nRBC 0.0 0.0 - 0.2 %    B/Positive/-- (10/12 1438)  IMAGING No results found.  MAU Management/MDM: Orders Placed This Encounter  Procedures  . Urinalysis, Routine w reflex microscopic Urine, Clean Catch  . CBC  . Comprehensive metabolic panel    No orders of the defined types were placed in this encounter.    ASSESSMENT 1. Headache in pregnancy, antepartum, second trimester   Reviewed labs: hemoglobin at 10.5, low end of normal, may or may not be contributing to headaches. Electrolytes, UA, and BP  WNL.   PLAN -Patient was instructed to start PO iron 325mg  every other day. Increase PO fluid intake.  -Patient given ER precautions and instructed to follow up with OBGYN.  -Discharge home   Allergies as of 05/26/2020      Reactions   Miconazole       Medication List    TAKE these medications   aspirin EC 81 MG tablet Take 1 tablet (81 mg total) by mouth daily. Take after 12 weeks for prevention of preeclampssia later in pregnancy   ferrous sulfate 325 (65 FE) MG tablet Take 1 tablet (325 mg total) by mouth every other day.   FLINSTONES GUMMIES OMEGA-3 DHA PO Take by mouth.  folic acid 1 MG tablet Commonly known as: FOLVITE Take 1 tablet (1 mg total) by mouth daily.   ondansetron 4 MG disintegrating tablet Commonly known as: Zofran ODT Take 1 tablet (4 mg total) by mouth every 6 (six) hours as needed for nausea.     Ileana Roup, Student-PA   CNM attestation:  I have seen and examined this patient; I agree with above documentation in the PA student's note.   Brenda Mccoy is a 25 y.o. G1P0 reporting recent headaches, none in MAU today. +FM, denies LOF, VB, contractions, vaginal discharge.  PE: BP 105/75   Pulse 82   Temp 98.2 F (36.8 C) (Oral)   Resp 18   Ht 5\' 4"  (1.626 m)   Wt 101.7 kg   LMP 11/29/2019   SpO2 99%   BMI 38.48 kg/m  Gen: calm comfortable, NAD Resp: normal effort, no distress Abd: gravid  ROS, labs, PMH reviewed NST reactive with 15 x 15 accels   Plan: Given normal BP, no evidence of preeclampsia D/C home Fetal kick counts reinforced, preterm labor precautions Rx for Ferrous sulfate Continue routine follow up in OB clinic Return to MAU as needed for emergencies  01/29/2020 Certified Nurse-Midwife 05/26/2020  1:30 PM

## 2020-06-14 ENCOUNTER — Other Ambulatory Visit: Payer: BC Managed Care – PPO

## 2020-06-14 ENCOUNTER — Encounter: Payer: BC Managed Care – PPO | Admitting: Certified Nurse Midwife

## 2020-06-18 ENCOUNTER — Ambulatory Visit (INDEPENDENT_AMBULATORY_CARE_PROVIDER_SITE_OTHER): Payer: BC Managed Care – PPO | Admitting: Certified Nurse Midwife

## 2020-06-18 ENCOUNTER — Other Ambulatory Visit: Payer: Self-pay

## 2020-06-18 ENCOUNTER — Other Ambulatory Visit: Payer: BC Managed Care – PPO

## 2020-06-18 VITALS — BP 110/60 | Wt 227.0 lb

## 2020-06-18 DIAGNOSIS — Z131 Encounter for screening for diabetes mellitus: Secondary | ICD-10-CM

## 2020-06-18 DIAGNOSIS — Z130 Encounter for screening for diseases of the blood and blood-forming organs and certain disorders involving the immune mechanism: Secondary | ICD-10-CM

## 2020-06-18 DIAGNOSIS — Z3A28 28 weeks gestation of pregnancy: Secondary | ICD-10-CM

## 2020-06-18 DIAGNOSIS — Z23 Encounter for immunization: Secondary | ICD-10-CM | POA: Diagnosis not present

## 2020-06-18 DIAGNOSIS — Z113 Encounter for screening for infections with a predominantly sexual mode of transmission: Secondary | ICD-10-CM

## 2020-06-18 DIAGNOSIS — Z3403 Encounter for supervision of normal first pregnancy, third trimester: Secondary | ICD-10-CM

## 2020-06-18 LAB — POCT URINALYSIS DIPSTICK OB
Glucose, UA: NEGATIVE
POC,PROTEIN,UA: NEGATIVE

## 2020-06-18 NOTE — Progress Notes (Signed)
ROB-Doing well. Completed Waterbirth class, advised to send certificate via MyChart. 28 week labs today, see orders. Blood transfusion consent signed. TDaP given, see MAR. Third trimester handouts provided. Breastfeeding education completed, see chart. Anticipatory guidance regarding prenatal care. Reviewed red flag symptoms and when to call. RTC x 2 weeks for ROB or sooner if needed.

## 2020-06-18 NOTE — Patient Instructions (Addendum)
[facility] Pediatrician List   [facility]  [address] [place], [place], [place] [address]  Phone: [phone]   [facility] (second location)  [address] [place], [place] [address]  Phone: [phone]   [facility]) [address] [place], [place], [place] [address] Phone: [phone]   [facility]  [address], [place], [place] [address]  Phone: [phone]  Breastfeeding  Choosing to breastfeed is one of the best decisions you can make for yourself and your baby. A change in hormones during pregnancy causes your breasts to make breast milk in your milk-producing glands. Hormones prevent breast milk from being released before your baby is born. They also prompt milk flow after birth. Once breastfeeding has begun, thoughts of your baby, as well as his or her sucking or crying, can stimulate the release of milk from your milk-producing glands. Benefits of breastfeeding Research shows that breastfeeding offers many health benefits for infants and mothers. It also offers a cost-free and convenient way to feed your baby. For your baby  Your first milk (colostrum) helps your baby's digestive system to function better.  Special cells in your milk (antibodies) help your baby to fight off infections.  Breastfed babies are less likely to develop asthma, allergies, obesity, or type 2 diabetes. They are also at lower risk for sudden infant death syndrome (SIDS).  Nutrients in breast milk are better able to meet your baby's needs compared to infant formula.  Breast milk improves your baby's brain development. For you  Breastfeeding helps to create a very special bond between you and your baby.  Breastfeeding is convenient. Breast milk costs nothing and is always available at the correct temperature.  Breastfeeding helps to burn calories. It helps you to lose the weight that you gained during pregnancy.  Breastfeeding makes your  uterus return faster to its size before pregnancy. It also slows bleeding (lochia) after you give birth.  Breastfeeding helps to lower your risk of developing type 2 diabetes, osteoporosis, rheumatoid arthritis, cardiovascular disease, and breast, ovarian, uterine, and endometrial cancer later in life. Breastfeeding basics Starting breastfeeding  Find a comfortable place to sit or lie down, with your neck and back well-supported.  Place a pillow or a rolled-up blanket under your baby to bring him or her to the level of your breast (if you are seated). Nursing pillows are specially designed to help support your arms and your baby while you breastfeed.  Make sure that your baby's tummy (abdomen) is facing your abdomen.  Gently massage your breast. With your fingertips, massage from the outer edges of your breast inward toward the nipple. This encourages milk flow. If your milk flows slowly, you may need to continue this action during the feeding.  Support your breast with 4 fingers underneath and your thumb above your nipple (make the letter "C" with your hand). Make sure your fingers are well away from your nipple and your baby's mouth.  Stroke your baby's lips gently with your finger or nipple.  When your baby's mouth is open wide enough, quickly bring your baby to your breast, placing your entire nipple and as much of the areola as possible into your baby's mouth. The areola is the colored area around your nipple. ? More areola should be visible above your baby's upper lip than below the lower lip. ? Your baby's lips should be opened and extended outward (flanged) to ensure an adequate, comfortable latch. ? Your baby's tongue should be between  his or her lower gum and your breast.  Make sure that your baby's mouth is correctly positioned around your nipple (latched). Your baby's lips should create a seal on your breast and be turned out (everted).  It is common for your baby to suck about  2-3 minutes in order to start the flow of breast milk. Latching Teaching your baby how to latch onto your breast properly is very important. An improper latch can cause nipple pain, decreased milk supply, and poor weight gain in your baby. Also, if your baby is not latched onto your nipple properly, he or she may swallow some air during feeding. This can make your baby fussy. Burping your baby when you switch breasts during the feeding can help to get rid of the air. However, teaching your baby to latch on properly is still the best way to prevent fussiness from swallowing air while breastfeeding. Signs that your baby has successfully latched onto your nipple  Silent tugging or silent sucking, without causing you pain. Infant's lips should be extended outward (flanged).  Swallowing heard between every 3-4 sucks once your milk has started to flow (after your let-down milk reflex occurs).  Muscle movement above and in front of his or her ears while sucking. Signs that your baby has not successfully latched onto your nipple  Sucking sounds or smacking sounds from your baby while breastfeeding.  Nipple pain. If you think your baby has not latched on correctly, slip your finger into the corner of your baby's mouth to break the suction and place it between your baby's gums. Attempt to start breastfeeding again. Signs of successful breastfeeding Signs from your baby  Your baby will gradually decrease the number of sucks or will completely stop sucking.  Your baby will fall asleep.  Your baby's body will relax.  Your baby will retain a small amount of milk in his or her mouth.  Your baby will let go of your breast by himself or herself. Signs from you  Breasts that have increased in firmness, weight, and size 1-3 hours after feeding.  Breasts that are softer immediately after breastfeeding.  Increased milk volume, as well as a change in milk consistency and color by the fifth day of  breastfeeding.  Nipples that are not sore, cracked, or bleeding. Signs that your baby is getting enough milk  Wetting at least 1-2 diapers during the first 24 hours after birth.  Wetting at least 5-6 diapers every 24 hours for the first week after birth. The urine should be clear or pale yellow by the age of 5 days.  Wetting 6-8 diapers every 24 hours as your baby continues to grow and develop.  At least 3 stools in a 24-hour period by the age of 5 days. The stool should be soft and yellow.  At least 3 stools in a 24-hour period by the age of 7 days. The stool should be seedy and yellow.  No loss of weight greater than 10% of birth weight during the first 3 days of life.  Average weight gain of 4-7 oz (113-198 g) per week after the age of 4 days.  Consistent daily weight gain by the age of 5 days, without weight loss after the age of 2 weeks. After a feeding, your baby may spit up a small amount of milk. This is normal. Breastfeeding frequency and duration Frequent feeding will help you make more milk and can prevent sore nipples and extremely full breasts (breast engorgement). Breastfeed when  you feel the need to reduce the fullness of your breasts or when your baby shows signs of hunger. This is called "breastfeeding on demand." Signs that your baby is hungry include:  Increased alertness, activity, or restlessness.  Movement of the head from side to side.  Opening of the mouth when the corner of the mouth or cheek is stroked (rooting).  Increased sucking sounds, smacking lips, cooing, sighing, or squeaking.  Hand-to-mouth movements and sucking on fingers or hands.  Fussing or crying. Avoid introducing a pacifier to your baby in the first 4-6 weeks after your baby is born. After this time, you may choose to use a pacifier. Research has shown that pacifier use during the first year of a baby's life decreases the risk of sudden infant death syndrome (SIDS). Allow your baby to feed  on each breast as long as he or she wants. When your baby unlatches or falls asleep while feeding from the first breast, offer the second breast. Because newborns are often sleepy in the first few weeks of life, you may need to awaken your baby to get him or her to feed. Breastfeeding times will vary from baby to baby. However, the following rules can serve as a guide to help you make sure that your baby is properly fed:  Newborns (babies 35 weeks of age or younger) may breastfeed every 1-3 hours.  Newborns should not go without breastfeeding for longer than 3 hours during the day or 5 hours during the night.  You should breastfeed your baby a minimum of 8 times in a 24-hour period. Breast milk pumping Pumping and storing breast milk allows you to make sure that your baby is exclusively fed your breast milk, even at times when you are unable to breastfeed. This is especially important if you go back to work while you are still breastfeeding, or if you are not able to be present during feedings. Your lactation consultant can help you find a method of pumping that works best for you and give you guidelines about how long it is safe to store breast milk.      Caring for your breasts while you breastfeed Nipples can become dry, cracked, and sore while breastfeeding. The following recommendations can help keep your breasts moisturized and healthy:  Avoid using soap on your nipples.  Wear a supportive bra designed especially for nursing. Avoid wearing underwire-style bras or extremely tight bras (sports bras).  Air-dry your nipples for 3-4 minutes after each feeding.  Use only cotton bra pads to absorb leaked breast milk. Leaking of breast milk between feedings is normal.  Use lanolin on your nipples after breastfeeding. Lanolin helps to maintain your skin's normal moisture barrier. Pure lanolin is not harmful (not toxic) to your baby. You may also hand express a few drops of breast milk and gently  massage that milk into your nipples and allow the milk to air-dry. In the first few weeks after giving birth, some women experience breast engorgement. Engorgement can make your breasts feel heavy, warm, and tender to the touch. Engorgement peaks within 3-5 days after you give birth. The following recommendations can help to ease engorgement:  Completely empty your breasts while breastfeeding or pumping. You may want to start by applying warm, moist heat (in the shower or with warm, water-soaked hand towels) just before feeding or pumping. This increases circulation and helps the milk flow. If your baby does not completely empty your breasts while breastfeeding, pump any extra milk after  he or she is finished.  Apply ice packs to your breasts immediately after breastfeeding or pumping, unless this is too uncomfortable for you. To do this: ? Put ice in a plastic bag. ? Place a towel between your skin and the bag. ? Leave the ice on for 20 minutes, 2-3 times a day.  Make sure that your baby is latched on and positioned properly while breastfeeding. If engorgement persists after 48 hours of following these recommendations, contact your health care provider or a Advertising copywriter. Overall health care recommendations while breastfeeding  Eat 3 healthy meals and 3 snacks every day. Well-nourished mothers who are breastfeeding need an additional 450-500 calories a day. You can meet this requirement by increasing the amount of a balanced diet that you eat.  Drink enough water to keep your urine pale yellow or clear.  Rest often, relax, and continue to take your prenatal vitamins to prevent fatigue, stress, and low vitamin and mineral levels in your body (nutrient deficiencies).  Do not use any products that contain nicotine or tobacco, such as cigarettes and e-cigarettes. Your baby may be harmed by chemicals from cigarettes that pass into breast milk and exposure to secondhand smoke. If you need help  quitting, ask your health care provider.  Avoid alcohol.  Do not use illegal drugs or marijuana.  Talk with your health care provider before taking any medicines. These include over-the-counter and prescription medicines as well as vitamins and herbal supplements. Some medicines that may be harmful to your baby can pass through breast milk.  It is possible to become pregnant while breastfeeding. If birth control is desired, ask your health care provider about options that will be safe while breastfeeding your baby. Where to find more information: [employer]: www.llli.org Contact a health care provider if:  You feel like you want to stop breastfeeding or have become frustrated with breastfeeding.  Your nipples are cracked or bleeding.  Your breasts are red, tender, or warm.  You have: ? Painful breasts or nipples. ? A swollen area on either breast. ? A fever or chills. ? Nausea or vomiting. ? Drainage other than breast milk from your nipples.  Your breasts do not become full before feedings by the fifth day after you give birth.  You feel sad and depressed.  Your baby is: ? Too sleepy to eat well. ? Having trouble sleeping. ? More than 34 week old and wetting fewer than 6 diapers in a 24-hour period. ? Not gaining weight by 78 days of age.  Your baby has fewer than 3 stools in a 24-hour period.  Your baby's skin or the white parts of his or her eyes become yellow. Get help right away if:  Your baby is overly tired (lethargic) and does not want to wake up and feed.  Your baby develops an unexplained fever. Summary  Breastfeeding offers many health benefits for infant and mothers.  Try to breastfeed your infant when he or she shows early signs of hunger.  Gently tickle or stroke your baby's lips with your finger or nipple to allow the baby to open his or her mouth. Bring the baby to your breast. Make sure that much of the areola is in your baby's mouth.  Offer one side and burp the baby before you offer the other side.  Talk with your health care provider or lactation consultant if you have questions or you face problems as you breastfeed. This information is not intended to replace  advice given to you by your health care provider. Make sure you discuss any questions you have with your health care provider. Document Revised: 07/05/2017 Document Reviewed: 05/12/2016 Elsevier Patient Education  2021 Elsevier Inc.   Pain Relief During Labor and Delivery Many things can cause pain during labor and delivery, including:  Pressure due to the baby moving through the pelvis.  Stretching of tissues due to the baby moving through the birth canal.  Muscle tension due to anxiety or nervousness.  The uterus tightening (contracting)and relaxing to help move the baby. How do I get pain relief during labor and delivery? Discuss your pain relief options with your health care provider during your prenatal visits. Explore the options offered by your hospital or birth center. There are many ways to deal with the pain of labor and delivery. You can try relaxation techniques or doing relaxing activities, taking a warm shower or bath (hydrotherapy), or other methods. There are also many medicines available to help control pain. Relaxation techniques and activities Practice relaxation techniques or do relaxing activities, such as:  Focused breathing.  Meditation.  Visualization.  Aroma therapy.  Listening to your favorite music.  Hypnosis. Hydrotherapy Take a warm shower or bath. This may:  Provide comfort and relaxation.  Lessen your feeling of pain.  Reduce the amount of pain medicine needed.  Shorten the length of labor. Other methods Try doing other things, such as:  Getting a massage or having counterpressure on your back.  Applying warm packs or ice packs.  Changing positions often, moving around, or using a birthing  ball. Medicines You may be given:  Pain medicine through an IV or an injection into a muscle.  Pain medicine inserted into your spinal column.  Injections of sterile water just under the skin on your lower back.  Nitrous oxide inhalation therapy, also called laughing gas.   What kinds of medicine are available for pain relief? There are two kinds of medicines that can be used to relieve pain during labor and delivery:  Analgesics. These medicines decrease pain without causing you to lose feeling or the ability to move your muscles.  Anesthetics. These medicines block feeling in the body and can decrease your ability to move freely. Both kinds of medicine can cause minor side effects, such as nausea, trouble concentrating, and sleepiness. They can also affect the baby's heart rate before birth and his or her breathing after birth. For this reason, health care providers are careful about when and how much medicine is given. Which medicines are used to provide pain relief? Common medicines The most common medicines used to help manage pain during labor and delivery include:  Opioids. Opioids are medicines that decrease how much pain you feel (perception of pain). These medicines can be given through an IV or may be used with anesthetics to block pain.  Epidural analgesia. ? Epidural analgesia is given through a very thin tube that is inserted into the lower back. Medicine is delivered continuously to the area near your spinal column nerves (epidural space). After having this treatment, you may be able to move your legs, but you will not be able to walk. Depending on the amount and type of medicine given, you may lose all feeling in the lower half of your body, or you may have some sensation, including the urge to push. This treatment can be used to give pain relief for a vaginal birth. ? Sometimes, a numbing medicine is injected into the spinal fluid when  an epidural catheter is placed. This  provides for immediate relief but only lasts for 1-2 hours. Once it wears off, the epidural will provide pain relief. This is called a combined spinal-epidural (CSE) block.  Intrathecal analgesia (spinal analgesia). Intrathecal analgesia is similar to epidural analgesia, but the medicine is injected into the spinal fluid instead of the epidural space. It is usually only given once. It starts to relieve pain quickly, but the pain relief lasts only 1-2 hours.  Pudendal block. This block is done by injecting numbing medicine through the wall of the vagina and into a nerve in the pelvis. Other medicines Other medicines used to help manage pain during labor and delivery include:  Local anesthetics. These are used to numb a small area of the body. They may be used along with another kind of medicine or used to numb the nerves of the vagina, cervix, and perineum during the second stage of labor.  Spinal block (spinal anesthesia). Spinal anesthesia is similar to spinal analgesia, but the medicine that is used contains longer-acting numbing medicines and pain medicines. This type of anesthesia can be used for a cesarean delivery and allows you to stay awake for the birth of your baby.  General anesthetics cause you to lose consciousness so you do not feel pain. They are usually only used for an emergency cesarean delivery. These medicines are given through an IV or a mask or both. These medicines are used as part of a procedure or for an emergency delivery. Summary  Women have many options to help them manage the pain associated with labor and delivery.  You can try doing relaxing activities, taking a warm shower or bath, or other methods.  There are also many medicines available to help control pain during labor and delivery.  Talk with your health care provider about what options are available to you. This information is not intended to replace advice given to you by your health care provider. Make  sure you discuss any questions you have with your health care provider. Document Revised: 02/26/2019 Document Reviewed: 02/26/2019 Elsevier Patient Education  2021 [employer].   Third Trimester of Pregnancy  The third trimester of pregnancy is from week 28 through week 40. This is also called months 7 through 9. This trimester is when your unborn baby (fetus) is growing very fast. At the end of the ninth month, the unborn baby is about 20 inches long. It weighs about 6-10 pounds. Body changes during your third trimester Your body continues to go through many changes during this time. The changes vary and generally return to normal after the baby is born. Physical changes  Your weight will continue to increase. You may gain 25-35 pounds (11-16 kg) by the end of the pregnancy. If you are underweight, you may gain 28-40 lb (about 13-18 kg). If you are overweight, you may gain 15-25 lb (about 7-11 kg).  You may start to get stretch marks on your hips, belly (abdomen), and breasts.  Your breasts will continue to grow and may hurt. A yellow fluid (colostrum) may leak from your breasts. This is the first milk you are making for your baby.  You may have changes in your hair.  Your belly button may stick out.  You may have more swelling in your hands, face, or ankles. Health changes  You may have heartburn.  You may have trouble pooping (constipation).  You may get hemorrhoids. These are swollen veins in the butt that can itch  or get painful.  You may have swollen veins (varicose veins) in your legs.  You may have more body aches in the pelvis, back, or thighs.  You may have more tingling or numbness in your hands, arms, and legs. The skin on your belly may also feel numb.  You may feel short of breath as your womb (uterus) gets bigger. Other changes  You may pee (urinate) more often.  You may have more problems sleeping.  You may notice the unborn baby "dropping," or moving  lower in your belly.  You may have more discharge coming from your vagina.  Your joints may feel loose, and you may have pain around your pelvic bone. Follow these instructions at home: Medicines  Take over-the-counter and prescription medicines only as told by your doctor. Some medicines are not safe during pregnancy.  Take a prenatal vitamin that contains at least 600 micrograms (mcg) of folic acid. Eating and drinking  Eat healthy meals that include: ? Fresh fruits and vegetables. ? Whole grains. ? Good sources of protein, such as meat, eggs, or tofu. ? Low-fat dairy products.  Avoid raw meat and unpasteurized juice, milk, and cheese. These carry germs that can harm you and your baby.  Eat 4 or 5 small meals rather than 3 large meals a day.  You may need to take these actions to prevent or treat trouble pooping: ? Drink enough fluids to keep your pee (urine) pale yellow. ? Eat foods that are high in fiber. These include beans, whole grains, and fresh fruits and vegetables. ? Limit foods that are high in fat and sugar. These include fried or sweet foods. Activity  Exercise only as told by your doctor. Stop exercising if you start to have cramps in your womb.  Avoid heavy lifting.  Do not exercise if it is too hot or too humid, or if you are in a place of great height (high altitude).  If you choose to, you may have sex unless your doctor tells you not to. Relieving pain and discomfort  Take breaks often, and rest with your legs raised (elevated) if you have leg cramps or low back pain.  Take warm water baths (sitz baths) to soothe pain or discomfort caused by hemorrhoids. Use hemorrhoid cream if your doctor approves.  Wear a good support bra if your breasts are tender.  If you develop bulging, swollen veins in your legs: ? Wear support hose as told by your doctor. ? Raise your feet for 15 minutes, 3-4 times a day. ? Limit salt in your food. Safety  Talk to your  doctor before traveling far distances.  Do not use hot tubs, steam rooms, or saunas.  Wear your seat belt at all times when you are in a car.  Talk with your doctor if someone is hurting you or yelling at you a lot. Preparing for your baby's arrival To prepare for the arrival of your baby:  Take prenatal classes.  Visit the hospital and tour the maternity area.  Buy a rear-facing car seat. Learn how to install it in your car.  Prepare the baby's room. Take out all pillows and stuffed animals from the baby's crib. General instructions  Avoid cat litter boxes and soil used by cats. These carry germs that can cause harm to the baby and can cause a loss of your baby by miscarriage or stillbirth.  Do not douche or use tampons. Do not use scented sanitary pads.  Do not smoke or  use any products that contain nicotine or tobacco. If you need help quitting, ask your doctor.  Do not drink alcohol.  Do not use herbal medicines, illegal drugs, or medicines that were not approved by your doctor. Chemicals in these products can affect your baby.  Keep all follow-up visits. This is important. Where to find more information  American Pregnancy Association: americanpregnancy.org  [employer]: www.acog.org  Office on Women's Health: [employer]co.nz Contact a doctor if:  You have a fever.  You have mild cramps or pressure in your lower belly.  You have a nagging pain in your belly area.  You vomit, or you have watery poop (diarrhea).  You have bad-smelling fluid coming from your vagina.  You have pain when you pee, or your pee smells bad.  You have a headache that does not go away when you take medicine.  You have changes in how you see, or you see spots in front of your eyes. Get help right away if:  Your water breaks.  You have regular contractions that are less than 5 minutes apart.  You are spotting or bleeding from your  vagina.  You have very bad belly cramps or pain.  You have trouble breathing.  You have chest pain.  You faint.  You have not felt the baby move for the amount of time told by your doctor.  You have new or increased pain, swelling, or redness in an arm or leg. Summary  The third trimester is from week 28 through week 40 (months 7 through 9). This is the time when your unborn baby is growing very fast.  During this time, your discomfort may increase as you gain weight and as your baby grows.  Get ready for your baby to arrive by taking prenatal classes, buying a rear-facing car seat, and preparing the baby's room.  Get help right away if you are bleeding from your vagina, you have chest pain and trouble breathing, or you have not felt the baby move for the amount of time told by your doctor. This information is not intended to replace advice given to you by your health care provider. Make sure you discuss any questions you have with your health care provider. Document Revised: 09/17/2019 Document Reviewed: 07/24/2019 Elsevier Patient Education  2021 Elsevier Inc.    WHAT OB PATIENTS CAN EXPECT   Confirmation of pregnancy and ultrasound ordered if medically indicated-[redacted] weeks gestation  New OB (NOB) intake with nurse and New OB (NOB) labs- [redacted] weeks gestation  New OB (NOB) physical examination with provider- 11/[redacted] weeks gestation  Flu vaccine-[redacted] weeks gestation  Anatomy scan-[redacted] weeks gestation  Glucose tolerance test, blood work to test for anemia, T-dap vaccine-[redacted] weeks gestation  Vaginal swabs/cultures-STD/Group B strep-[redacted] weeks gestation  Appointments every 4 weeks until 28 weeks  Every 2 weeks from 28 weeks until 36 weeks  Weekly visits from 36 weeks until delivery

## 2020-06-18 NOTE — Progress Notes (Signed)
ROB- 28 weeks labs, no concerns

## 2020-06-22 LAB — CBC
Hematocrit: 31.5 % — ABNORMAL LOW (ref 34.0–46.6)
Hemoglobin: 10.4 g/dL — ABNORMAL LOW (ref 11.1–15.9)
MCH: 29.1 pg (ref 26.6–33.0)
MCHC: 33 g/dL (ref 31.5–35.7)
MCV: 88 fL (ref 79–97)
Platelets: 226 10*3/uL (ref 150–450)
RBC: 3.57 x10E6/uL — ABNORMAL LOW (ref 3.77–5.28)
RDW: 13 % (ref 11.7–15.4)
WBC: 8.6 10*3/uL (ref 3.4–10.8)

## 2020-06-22 LAB — RPR, QUANT+TP ABS (REFLEX)
Rapid Plasma Reagin, Quant: 1:1 {titer} — ABNORMAL HIGH
T Pallidum Abs: NONREACTIVE

## 2020-06-22 LAB — GLUCOSE, 1 HOUR GESTATIONAL: Gestational Diabetes Screen: 108 mg/dL (ref 65–139)

## 2020-06-22 LAB — RPR: RPR Ser Ql: REACTIVE — AB

## 2020-06-24 ENCOUNTER — Encounter: Payer: Self-pay | Admitting: Certified Nurse Midwife

## 2020-06-24 DIAGNOSIS — A530 Latent syphilis, unspecified as early or late: Secondary | ICD-10-CM | POA: Insufficient documentation

## 2020-06-24 DIAGNOSIS — O99019 Anemia complicating pregnancy, unspecified trimester: Secondary | ICD-10-CM | POA: Insufficient documentation

## 2020-07-02 ENCOUNTER — Encounter: Payer: BC Managed Care – PPO | Admitting: Certified Nurse Midwife

## 2020-07-02 ENCOUNTER — Encounter: Payer: Self-pay | Admitting: Certified Nurse Midwife

## 2020-07-02 ENCOUNTER — Other Ambulatory Visit: Payer: Self-pay

## 2020-07-02 ENCOUNTER — Ambulatory Visit (INDEPENDENT_AMBULATORY_CARE_PROVIDER_SITE_OTHER): Payer: BC Managed Care – PPO | Admitting: Certified Nurse Midwife

## 2020-07-02 VITALS — BP 127/79 | HR 103 | Wt 226.9 lb

## 2020-07-02 DIAGNOSIS — Z3A30 30 weeks gestation of pregnancy: Secondary | ICD-10-CM

## 2020-07-02 DIAGNOSIS — O99013 Anemia complicating pregnancy, third trimester: Secondary | ICD-10-CM

## 2020-07-02 DIAGNOSIS — Z3403 Encounter for supervision of normal first pregnancy, third trimester: Secondary | ICD-10-CM

## 2020-07-02 LAB — POCT URINALYSIS DIPSTICK OB
Bilirubin, UA: NEGATIVE
Blood, UA: NEGATIVE
Glucose, UA: NEGATIVE
Ketones, UA: NEGATIVE
Leukocytes, UA: NEGATIVE
Nitrite, UA: NEGATIVE
POC,PROTEIN,UA: NEGATIVE
Spec Grav, UA: 1.015 (ref 1.010–1.025)
Urobilinogen, UA: 0.2 E.U./dL
pH, UA: 7.5 (ref 5.0–8.0)

## 2020-07-02 NOTE — Progress Notes (Signed)
ROB-Reports occasional abdominal cramping. Discussed home treatment measures including abdominal support. Not taking iron supplement due to nausea and vomiting. Samples Fusion Plus given. Anticipatory guidance regarding course of prenatal care. Reviewed red flag symptoms and when to call. RTC x 2 weeks for ROB or sooner if needed.

## 2020-07-02 NOTE — Progress Notes (Signed)
ROB, some cramping.

## 2020-07-02 NOTE — Patient Instructions (Addendum)
Fetal Movement Counts Patient Name: ________________________________________________ Patient Due Date: ____________________  What is a fetal movement count? A fetal movement count is the number of times that you feel your baby move during a certain amount of time. This may also be called a fetal kick count. A fetal movement count is recommended for every pregnant woman. You may be asked to start counting fetal movements as early as week 28 of your pregnancy. Pay attention to when your baby is most active. You may notice your baby's sleep and wake cycles. You may also notice things that make your baby move more. You should do a fetal movement count:  When your baby is normally most active.  At the same time each day. A good time to count movements is while you are resting, after having something to eat and drink. How do I count fetal movements? 1. Find a quiet, comfortable area. Sit, or lie down on your side. 2. Write down the date, the start time and stop time, and the number of movements that you felt between those two times. Take this information with you to your health care visits. 3. Write down your start time when you feel the first movement. 4. Count kicks, flutters, swishes, rolls, and jabs. You should feel at least 10 movements. 5. You may stop counting after you have felt 10 movements, or if you have been counting for 2 hours. Write down the stop time. 6. If you do not feel 10 movements in 2 hours, contact your health care provider for further instructions. Your health care provider may want to do additional tests to assess your baby's well-being. Contact a health care provider if:  You feel fewer than 10 movements in 2 hours.  Your baby is not moving like he or she usually does. Date: ____________ Start time: ____________ Stop time: ____________ Movements: ____________ Date: ____________ Start time: ____________ Stop time: ____________ Movements: ____________ Date: ____________  Start time: ____________ Stop time: ____________ Movements: ____________ Date: ____________ Start time: ____________ Stop time: ____________ Movements: ____________ Date: ____________ Start time: ____________ Stop time: ____________ Movements: ____________ Date: ____________ Start time: ____________ Stop time: ____________ Movements: ____________ Date: ____________ Start time: ____________ Stop time: ____________ Movements: ____________ Date: ____________ Start time: ____________ Stop time: ____________ Movements: ____________ Date: ____________ Start time: ____________ Stop time: ____________ Movements: ____________ This information is not intended to replace advice given to you by your health care provider. Make sure you discuss any questions you have with your health care provider. Document Revised: 11/28/2018 Document Reviewed: 11/28/2018 Elsevier Patient Education  2021 Elsevier Inc.   Round Ligament Pain  The round ligament is a cord of muscle and tissue that helps support the uterus. It can become a source of pain during pregnancy if it becomes stretched or twisted as the baby grows. The pain usually begins in the second trimester (13-28 weeks) of pregnancy, and it can come and go until the baby is delivered. It is not a serious problem, and it does not cause harm to the baby. Round ligament pain is usually a short, sharp, and pinching pain, but it can also be a dull, lingering, and aching pain. The pain is felt in the lower side of the abdomen or in the groin. It usually starts deep in the groin and moves up to the outside of the hip area. The pain may occur when you:  Suddenly change position, such as quickly going from a sitting to standing position.  Roll over in bed.  Cough or sneeze.  Do physical activity. Follow these instructions at home:  Watch your condition for any changes.  When the pain starts, relax. Then try any of these methods to help with the pain: ? Sitting  down. ? Flexing your knees up to your abdomen. ? Lying on your side with one pillow under your abdomen and another pillow between your legs. ? Sitting in a warm bath for 15-20 minutes or until the pain goes away.  Take over-the-counter and prescription medicines only as told by your health care provider.  Move slowly when you sit down or stand up.  Avoid long walks if they cause pain.  Stop or reduce your physical activities if they cause pain.  Keep all follow-up visits as told by your health care provider. This is important.   Contact a health care provider if:  Your pain does not go away with treatment.  You feel pain in your back that you did not have before.  Your medicine is not helping. Get help right away if:  You have a fever or chills.  You develop uterine contractions.  You have vaginal bleeding.  You have nausea or vomiting.  You have diarrhea.  You have pain when you urinate. Summary  Round ligament pain is felt in the lower abdomen or groin. It is usually a short, sharp, and pinching pain. It can also be a dull, lingering, and aching pain.  This pain usually begins in the second trimester (13-28 weeks). It occurs because the uterus is stretching with the growing baby, and it is not harmful to the baby.  You may notice the pain when you suddenly change position, when you cough or sneeze, or during physical activity.  Relaxing, flexing your knees to your abdomen, lying on one side, or taking a warm bath may help to get rid of the pain.  Get help from your health care provider if the pain does not go away or if you have vaginal bleeding, nausea, vomiting, diarrhea, or painful urination. This information is not intended to replace advice given to you by your health care provider. Make sure you discuss any questions you have with your health care provider. Document Revised: 09/26/2017 Document Reviewed: 09/26/2017 Elsevier Patient Education  2021 [employer].   Third Trimester of Pregnancy  The third trimester of pregnancy is from week 28 through week 40. This is also called months 7 through 9. This trimester is when your unborn baby (fetus) is growing very fast. At the end of the ninth month, the unborn baby is about 20 inches long. It weighs about 6-10 pounds. Body changes during your third trimester Your body continues to go through many changes during this time. The changes vary and generally return to normal after the baby is born. Physical changes  Your weight will continue to increase. You may gain 25-35 pounds (11-16 kg) by the end of the pregnancy. If you are underweight, you may gain 28-40 lb (about 13-18 kg). If you are overweight, you may gain 15-25 lb (about 7-11 kg).  You may start to get stretch marks on your hips, belly (abdomen), and breasts.  Your breasts will continue to grow and may hurt. A yellow fluid (colostrum) may leak from your breasts. This is the first milk you are making for your baby.  You may have changes in your hair.  Your belly button may stick out.  You may have more swelling in your hands, face, or ankles. Health changes  You may have heartburn.  You may have trouble pooping (constipation).  You may get hemorrhoids. These are swollen veins in the butt that can itch or get painful.  You may have swollen veins (varicose veins) in your legs.  You may have more body aches in the pelvis, back, or thighs.  You may have more tingling or numbness in your hands, arms, and legs. The skin on your belly may also feel numb.  You may feel short of breath as your womb (uterus) gets bigger. Other changes  You may pee (urinate) more often.  You may have more problems sleeping.  You may notice the unborn baby "dropping," or moving lower in your belly.  You may have more discharge coming from your vagina.  Your joints may feel loose, and you may have pain around your pelvic bone. Follow these  instructions at home: Medicines  Take over-the-counter and prescription medicines only as told by your doctor. Some medicines are not safe during pregnancy.  Take a prenatal vitamin that contains at least 600 micrograms (mcg) of folic acid. Eating and drinking  Eat healthy meals that include: ? Fresh fruits and vegetables. ? Whole grains. ? Good sources of protein, such as meat, eggs, or tofu. ? Low-fat dairy products.  Avoid raw meat and unpasteurized juice, milk, and cheese. These carry germs that can harm you and your baby.  Eat 4 or 5 small meals rather than 3 large meals a day.  You may need to take these actions to prevent or treat trouble pooping: ? Drink enough fluids to keep your pee (urine) pale yellow. ? Eat foods that are high in fiber. These include beans, whole grains, and fresh fruits and vegetables. ? Limit foods that are high in fat and sugar. These include fried or sweet foods. Activity  Exercise only as told by your doctor. Stop exercising if you start to have cramps in your womb.  Avoid heavy lifting.  Do not exercise if it is too hot or too humid, or if you are in a place of great height (high altitude).  If you choose to, you may have sex unless your doctor tells you not to. Relieving pain and discomfort  Take breaks often, and rest with your legs raised (elevated) if you have leg cramps or low back pain.  Take warm water baths (sitz baths) to soothe pain or discomfort caused by hemorrhoids. Use hemorrhoid cream if your doctor approves.  Wear a good support bra if your breasts are tender.  If you develop bulging, swollen veins in your legs: ? Wear support hose as told by your doctor. ? Raise your feet for 15 minutes, 3-4 times a day. ? Limit salt in your food. Safety  Talk to your doctor before traveling far distances.  Do not use hot tubs, steam rooms, or saunas.  Wear your seat belt at all times when you are in a car.  Talk with your doctor  if someone is hurting you or yelling at you a lot. Preparing for your baby's arrival To prepare for the arrival of your baby:  Take prenatal classes.  Visit the hospital and tour the maternity area.  Buy a rear-facing car seat. Learn how to install it in your car.  Prepare the baby's room. Take out all pillows and stuffed animals from the baby's crib. General instructions  Avoid cat litter boxes and soil used by cats. These carry germs that can cause harm to the baby and can cause a  loss of your baby by miscarriage or stillbirth.  Do not douche or use tampons. Do not use scented sanitary pads.  Do not smoke or use any products that contain nicotine or tobacco. If you need help quitting, ask your doctor.  Do not drink alcohol.  Do not use herbal medicines, illegal drugs, or medicines that were not approved by your doctor. Chemicals in these products can affect your baby.  Keep all follow-up visits. This is important. Where to find more information  American Pregnancy Association: americanpregnancy.org  [employer]: www.acog.org  Office on Women's Health: [employer]co.nz Contact a doctor if:  You have a fever.  You have mild cramps or pressure in your lower belly.  You have a nagging pain in your belly area.  You vomit, or you have watery poop (diarrhea).  You have bad-smelling fluid coming from your vagina.  You have pain when you pee, or your pee smells bad.  You have a headache that does not go away when you take medicine.  You have changes in how you see, or you see spots in front of your eyes. Get help right away if:  Your water breaks.  You have regular contractions that are less than 5 minutes apart.  You are spotting or bleeding from your vagina.  You have very bad belly cramps or pain.  You have trouble breathing.  You have chest pain.  You faint.  You have not felt the baby move for the amount  of time told by your doctor.  You have new or increased pain, swelling, or redness in an arm or leg. Summary  The third trimester is from week 28 through week 40 (months 7 through 9). This is the time when your unborn baby is growing very fast.  During this time, your discomfort may increase as you gain weight and as your baby grows.  Get ready for your baby to arrive by taking prenatal classes, buying a rear-facing car seat, and preparing the baby's room.  Get help right away if you are bleeding from your vagina, you have chest pain and trouble breathing, or you have not felt the baby move for the amount of time told by your doctor. This information is not intended to replace advice given to you by your health care provider. Make sure you discuss any questions you have with your health care provider. Document Revised: 09/17/2019 Document Reviewed: 07/24/2019 Elsevier Patient Education  2021 [employer].

## 2020-07-15 ENCOUNTER — Encounter: Payer: Self-pay | Admitting: Certified Nurse Midwife

## 2020-07-15 ENCOUNTER — Other Ambulatory Visit: Payer: Self-pay

## 2020-07-15 ENCOUNTER — Ambulatory Visit (INDEPENDENT_AMBULATORY_CARE_PROVIDER_SITE_OTHER): Payer: BC Managed Care – PPO | Admitting: Certified Nurse Midwife

## 2020-07-15 VITALS — BP 120/74 | HR 108 | Wt 229.5 lb

## 2020-07-15 DIAGNOSIS — M549 Dorsalgia, unspecified: Secondary | ICD-10-CM

## 2020-07-15 DIAGNOSIS — O219 Vomiting of pregnancy, unspecified: Secondary | ICD-10-CM

## 2020-07-15 DIAGNOSIS — Z3403 Encounter for supervision of normal first pregnancy, third trimester: Secondary | ICD-10-CM

## 2020-07-15 DIAGNOSIS — Z3A32 32 weeks gestation of pregnancy: Secondary | ICD-10-CM

## 2020-07-15 DIAGNOSIS — O99891 Other specified diseases and conditions complicating pregnancy: Secondary | ICD-10-CM

## 2020-07-15 LAB — POCT URINALYSIS DIPSTICK OB
Appearance: NORMAL
Bilirubin, UA: NEGATIVE
Blood, UA: NEGATIVE
Glucose, UA: NEGATIVE
Ketones, UA: NEGATIVE
Leukocytes, UA: NEGATIVE
Nitrite, UA: NEGATIVE
Odor: NORMAL
POC,PROTEIN,UA: NEGATIVE
Spec Grav, UA: 1.01 (ref 1.010–1.025)
Urobilinogen, UA: 0.2 E.U./dL
pH, UA: 7.5 (ref 5.0–8.0)

## 2020-07-15 NOTE — Progress Notes (Signed)
I have seen, interviewed, and examined the patient in conjunction with the [facility] and affirm the diagnosis and management plan.   Gunnar Bulla, CNM [facility], [facility] 07/15/20 1:53 PM

## 2020-07-15 NOTE — Patient Instructions (Addendum)
Back Pain in Pregnancy Back pain during pregnancy is common. Back pain may be caused by several factors that are related to changes during your pregnancy. Follow these instructions at home: Managing pain, stiffness, and swelling  If directed, for sudden (acute) back pain, put ice on the painful area. ? Put ice in a plastic bag. ? Place a towel between your skin and the bag. ? Leave the ice on for 20 minutes, 2-3 times per day.  If directed, apply heat to the affected area before you exercise. Use the heat source that your health care provider recommends, such as a moist heat pack or a heating pad. ? Place a towel between your skin and the heat source. ? Leave the heat on for 20-30 minutes. ? Remove the heat if your skin turns bright red. This is especially important if you are unable to feel pain, heat, or cold. You may have a greater risk of getting burned.  If directed, massage the affected area.      Activity  Exercise as told by your health care provider. Gentle exercise is the best way to prevent or manage back pain.  Listen to your body when lifting. If lifting hurts, ask for help or bend your knees. This uses your leg muscles instead of your back muscles.  Squat down when picking up something from the floor. Do not bend over.  Only use bed rest for short periods as told by your health care provider. Bed rest should only be used for the most severe episodes of back pain. Standing, sitting, and lying down  Do not stand in one place for long periods of time. Use good posture when sitting. Make sure your head rests over your shoulders and is not hanging forward. US Fetal Movement Counts Patient Name: ________________________________________________ Patient Due Date: ____________________  What is a fetal movement count? A fetal movement count is the number of times that you feel your baby move during a certain amount of time. This may also be called a fetal kick count. A  fetal movement count is recommended for every pregnant woman. You may be asked to start counting fetal movements as early as week 28 of your pregnancy. Pay attention to when your baby is most active. You may notice your baby's sleep and wake cycles. You may also notice things that make your baby move more. You should do a fetal movement count: When your baby is normally most active. At the same time each day. A good time to count movements is while you are resting, after having something to eat and drink. How do I count fetal movements? Find a quiet, comfortable area. Sit, or lie down on your side. Write down the date, the start time and stop time, and the number of movements that you felt between those two times. Take this information with you to your health care visits. Write down your start time when you feel the first movement. Count kicks, flutters, swishes, rolls, and jabs. You should feel at least 10 movements. You may stop counting after you have felt 10 movements, or if you have been counting for 2 hours. Write down the stop time. If you do not feel 10 movements in 2 hours, contact your health care provider for further instructions. Your health care provider may want to do additional tests to assess your baby's well-being. Contact a health care provider if: You feel fewer than 10 movements in 2 hours. Your baby is not moving like he  or she usually does. Date: ____________ Start time: ____________ Stop time: ____________ Movements: ____________ Date: ____________ Start time: ____________ Stop time: ____________ Movements: ____________ Date: ____________ Start time: ____________ Stop time: ____________ Movements: ____________ Date: ____________ Start time: ____________ Stop time: ____________ Movements: ____________ Date: ____________ Start time: ____________ Stop time: ____________ Movements: ____________ Date: ____________ Start time: ____________ Stop time: ____________ Movements:  ____________ Date: ____________ Start time: ____________ Stop time: ____________ Movements: ____________ Date: ____________ Start time: ____________ Stop time: ____________ Movements: ____________ Date: ____________ Start time: ____________ Stop time: ____________ Movements: ____________ This information is not intended to replace advice given to you by your health care provider. Make sure you discuss any questions you have with your health care provider. Document Revised: 11/28/2018 Document Reviewed: 11/28/2018 Elsevier Patient Education  2021 Elsevier Inc.  e a pillow on your lower back if necessary.  Try sleeping on your side, preferably the left side, with a pregnancy support pillow or 1-2 regular pillows between your legs. ? If you have back pain after a night's rest, your bed may be too soft. ? A firm mattress may provide more support for your back during pregnancy. General instructions  Do not wear high heels.  Eat a healthy diet. Try to gain weight within your health care provider's recommendations.  Use a maternity girdle, elastic sling, or back brace as told by your health care provider.  Take over-the-counter and prescription medicines only as told by your health care provider.  Work with a physical therapist or massage therapist to find ways to manage back pain. Acupuncture or massage therapy may be helpful.  Keep all follow-up visits as told by your health care provider. This is important. Contact a health care provider if:  Your back pain interferes with your daily activities.  You have increasing pain in other parts of your body. Get help right away if:  You develop numbness, tingling, weakness, or problems with the use of your arms or legs.  You develop severe back pain that is not controlled with medicine.  You have a change in bowel or bladder control.  You develop shortness of breath, dizziness, or you faint.  You develop nausea, vomiting, or  sweating.  You have back pain that is a rhythmic, cramping pain similar to labor pains. Labor pain is usually 1-2 minutes apart, lasts for about 1 minute, and involves a bearing down feeling or pressure in your pelvis.  You have back pain and your water breaks or you have vaginal bleeding.  You have back pain or numbness that travels down your leg.  Your back pain developed after you fell.  You develop pain on one side of your back.  You see blood in your urine.  You develop skin blisters in the area of your back pain. Summary  Back pain may be caused by several factors that are related to changes during your pregnancy.  Follow instructions as told by your health care provider for managing pain, stiffness, and swelling.  Exercise as told by your health care provider. Gentle exercise is the best way to prevent or manage back pain.  Take over-the-counter and prescription medicines only as told by your health care provider.  Keep all follow-up visits as told by your health care provider. This is important. This information is not intended to replace advice given to you by your health care provider. Make sure you discuss any questions you have with your health care provider. Document Revised: 07/30/2018 Document Reviewed: 09/26/2017 Elsevier Patient Education  2021 Elsevier Inc.    Rosen's Emergency Medicine: Concepts and Clinical Practice (9th ed., pp. 2296- 2312). Elsevier.">  Braxton Hicks Contractions Contractions of the uterus can occur throughout pregnancy, but they are not always a sign that you are in labor. You may have practice contractions called Braxton Hicks contractions. These false labor contractions are sometimes confused with true labor. What are Deberah Pelton contractions? Braxton Hicks contractions are tightening movements that occur in the muscles of the uterus before labor. Unlike true labor contractions, these contractions do not result in opening (dilation) and  thinning of the cervix. Toward the end of pregnancy (32-34 weeks), Braxton Hicks contractions can happen more often and may become stronger. These contractions are sometimes difficult to tell apart from true labor because they can be very uncomfortable. You should not feel embarrassed if you go to the hospital with false labor. Sometimes, the only way to tell if you are in true labor is for your health care provider to look for changes in the cervix. The health care provider will do a physical exam and may monitor your contractions. If you are not in true labor, the exam should show that your cervix is not dilating and your water has not broken. If there are no other health problems associated with your pregnancy, it is completely safe for you to be sent home with false labor. You may continue to have Braxton Hicks contractions until you go into true labor. How to tell the difference between true labor and false labor True labor  Contractions last 30-70 seconds.  Contractions become very regular.  Discomfort is usually felt in the top of the uterus, and it spreads to the lower abdomen and low back.  Contractions do not go away with walking.  Contractions usually become more intense and increase in frequency.  The cervix dilates and gets thinner. False labor  Contractions are usually shorter and not as strong as true labor contractions.  Contractions are usually irregular.  Contractions are often felt in the front of the lower abdomen and in the groin.  Contractions may go away when you walk around or change positions while lying down.  Contractions get weaker and are shorter-lasting as time goes on.  The cervix usually does not dilate or become thin. Follow these instructions at home:  Take over-the-counter and prescription medicines only as told by your health care provider.  Keep up with your usual exercises and follow other instructions from your health care provider.  Eat and  drink lightly if you think you are going into labor.  If Braxton Hicks contractions are making you uncomfortable: ? Change your position from lying down or resting to walking, or change from walking to resting. ? Sit and rest in a tub of warm water. ? Drink enough fluid to keep your urine pale yellow. Dehydration may cause these contractions. ? Do slow and deep breathing several times an hour.  Keep all follow-up prenatal visits as told by your health care provider. This is important.   Contact a health care provider if:  You have a fever.  You have continuous pain in your abdomen. Get help right away if:  Your contractions become stronger, more regular, and closer together.  You have fluid leaking or gushing from your vagina.  You pass blood-tinged mucus (bloody show).  You have bleeding from your vagina.  You have low back pain that you never had before.  You feel your baby's head pushing down and causing pelvic pressure.  Your baby is not moving inside you as much as it used to. Summary  Contractions that occur before labor are called Braxton Hicks contractions, false labor, or practice contractions.  Braxton Hicks contractions are usually shorter, weaker, farther apart, and less regular than true labor contractions. True labor contractions usually become progressively stronger and regular, and they become more frequent.  Manage discomfort from [facility] contractions by changing position, resting in a warm bath, drinking plenty of water, or practicing deep breathing. This information is not intended to replace advice given to you by your health care provider. Make sure you discuss any questions you have with your health care provider. Document Revised: 03/23/2017 Document Reviewed: 08/24/2016 Elsevier Patient Education  2021 [employer].    Third Trimester of Pregnancy  The third trimester of pregnancy is from week 28 through week 40. This is also called months 7  through 9. This trimester is when your unborn baby (fetus) is growing very fast. At the end of the ninth month, the unborn baby is about 20 inches long. It weighs about 6-10 pounds. Body changes during your third trimester Your body continues to go through many changes during this time. The changes vary and generally return to normal after the baby is born. Physical changes  Your weight will continue to increase. You may gain 25-35 pounds (11-16 kg) by the end of the pregnancy. If you are underweight, you may gain 28-40 lb (about 13-18 kg). If you are overweight, you may gain 15-25 lb (about 7-11 kg).  You may start to get stretch marks on your hips, belly (abdomen), and breasts.  Your breasts will continue to grow and may hurt. A yellow fluid (colostrum) may leak from your breasts. This is the first milk you are making for your baby.  You may have changes in your hair.  Your belly button may stick out.  You may have more swelling in your hands, face, or ankles. Health changes  You may have heartburn.  You may have trouble pooping (constipation).  You may get hemorrhoids. These are swollen veins in the butt that can itch or get painful.  You may have swollen veins (varicose veins) in your legs.  You may have more body aches in the pelvis, back, or thighs.  You may have more tingling or numbness in your hands, arms, and legs. The skin on your belly may also feel numb.  You may feel short of breath as your womb (uterus) gets bigger. Other changes  You may pee (urinate) more often.  You may have more problems sleeping.  You may notice the unborn baby "dropping," or moving lower in your belly.  You may have more discharge coming from your vagina.  Your joints may feel loose, and you may have pain around your pelvic bone. Follow these instructions at home: Medicines  Take over-the-counter and prescription medicines only as told by your doctor. Some medicines are not safe  during pregnancy.  Take a prenatal vitamin that contains at least 600 micrograms (mcg) of folic acid. Eating and drinking  Eat healthy meals that include: ? Fresh fruits and vegetables. ? Whole grains. ? Good sources of protein, such as meat, eggs, or tofu. ? Low-fat dairy products.  Avoid raw meat and unpasteurized juice, milk, and cheese. These carry germs that can harm you and your baby.  Eat 4 or 5 small meals rather than 3 large meals a day.  You may need to take these actions to prevent or  treat trouble pooping: ? Drink enough fluids to keep your pee (urine) pale yellow. ? Eat foods that are high in fiber. These include beans, whole grains, and fresh fruits and vegetables. ? Limit foods that are high in fat and sugar. These include fried or sweet foods. Activity  Exercise only as told by your doctor. Stop exercising if you start to have cramps in your womb.  Avoid heavy lifting.  Do not exercise if it is too hot or too humid, or if you are in a place of great height (high altitude).  If you choose to, you may have sex unless your doctor tells you not to. Relieving pain and discomfort  Take breaks often, and rest with your legs raised (elevated) if you have leg cramps or low back pain.  Take warm water baths (sitz baths) to soothe pain or discomfort caused by hemorrhoids. Use hemorrhoid cream if your doctor approves.  Wear a good support bra if your breasts are tender.  If you develop bulging, swollen veins in your legs: ? Wear support hose as told by your doctor. ? Raise your feet for 15 minutes, 3-4 times a day. ? Limit salt in your food. Safety  Talk to your doctor before traveling far distances.  Do not use hot tubs, steam rooms, or saunas.  Wear your seat belt at all times when you are in a car.  Talk with your doctor if someone is hurting you or yelling at you a lot. Preparing for your baby's arrival To prepare for the arrival of your baby:  Take  prenatal classes.  Visit the hospital and tour the maternity area.  Buy a rear-facing car seat. Learn how to install it in your car.  Prepare the baby's room. Take out all pillows and stuffed animals from the baby's crib. General instructions  Avoid cat litter boxes and soil used by cats. These carry germs that can cause harm to the baby and can cause a loss of your baby by miscarriage or stillbirth.  Do not douche or use tampons. Do not use scented sanitary pads.  Do not smoke or use any products that contain nicotine or tobacco. If you need help quitting, ask your doctor.  Do not drink alcohol.  Do not use herbal medicines, illegal drugs, or medicines that were not approved by your doctor. Chemicals in these products can affect your baby.  Keep all follow-up visits. This is important. Where to find more information  American Pregnancy Association: americanpregnancy.org  [employer]: www.acog.org  Office on Women's Health: [employer]Rew[employer]co.nz Contact a doctor if:  You have a fever.  You have mild cramps or pressure in your lower belly.  You have a nagging pain in your belly area.  You vomit, or you have watery poop (diarrhea).  You have bad-smelling fluid coming from your vagina.  You have pain when you pee, or your pee smells bad.  You have a headache that does not go away when you take medicine.  You have changes in how you see, or you see spots in front of your eyes. Get help right away if:  Your water breaks.  You have regular contractions that are less than 5 minutes apart.  You are spotting or bleeding from your vagina.  You have very bad belly cramps or pain.  You have trouble breathing.  You have chest pain.  You faint.  You have not felt the baby move for the amount of time told by  your doctor.  You have new or increased pain, swelling, or redness in an arm or leg. Summary  The third trimester is  from week 28 through week 40 (months 7 through 9). This is the time when your unborn baby is growing very fast.  During this time, your discomfort may increase as you gain weight and as your baby grows.  Get ready for your baby to arrive by taking prenatal classes, buying a rear-facing car seat, and preparing the baby's room.  Get help right away if you are bleeding from your vagina, you have chest pain and trouble breathing, or you have not felt the baby move for the amount of time told by your doctor. This information is not intended to replace advice given to you by your health care provider. Make sure you discuss any questions you have with your health care provider. Document Revised: 09/17/2019 Document Reviewed: 07/24/2019 Elsevier Patient Education  2021 [employer]

## 2020-07-15 NOTE — Progress Notes (Signed)
ROB- Doing well overall. Reports occasional n/v, back pain, and braxton hicks contractions. Discussed support belt, kinesiology tape options (kids rocktape or OK tape), and physical therapy patient verbalized understanding; declined referral at this time. Fusion Plus samples given today. Anticipatory guidance regarding course of prenatal care. Reviewed red flag symptoms and when to call. RTC x 2 weeks for ROB; and 4 weeks for GBS, GH/CH screening and ROB or sooner if needed.  Juliann Pares, Student-[employer]ife [employer] 07/15/20 9:19 AM

## 2020-07-15 NOTE — Progress Notes (Signed)
ROB: She is doing well, she has no concerns today. °

## 2020-07-30 ENCOUNTER — Encounter: Payer: Self-pay | Admitting: Certified Nurse Midwife

## 2020-07-30 ENCOUNTER — Ambulatory Visit (INDEPENDENT_AMBULATORY_CARE_PROVIDER_SITE_OTHER): Payer: BC Managed Care – PPO | Admitting: Certified Nurse Midwife

## 2020-07-30 ENCOUNTER — Other Ambulatory Visit: Payer: Self-pay

## 2020-07-30 VITALS — BP 111/76 | HR 112 | Wt 225.3 lb

## 2020-07-30 DIAGNOSIS — Z3A34 34 weeks gestation of pregnancy: Secondary | ICD-10-CM

## 2020-07-30 DIAGNOSIS — Z3403 Encounter for supervision of normal first pregnancy, third trimester: Secondary | ICD-10-CM

## 2020-07-30 LAB — POCT URINALYSIS DIPSTICK OB
Appearance: NORMAL
Bilirubin, UA: NEGATIVE
Blood, UA: NEGATIVE
Glucose, UA: NEGATIVE
Ketones, UA: NEGATIVE
Leukocytes, UA: NEGATIVE
Nitrite, UA: NEGATIVE
Odor: NORMAL
Spec Grav, UA: 1.01 (ref 1.010–1.025)
Urobilinogen, UA: 0.2 E.U./dL
pH, UA: 7 (ref 5.0–8.0)

## 2020-07-30 NOTE — Patient Instructions (Signed)
Fetal Movement Counts Patient Name: ________________________________________________ Patient Due Date: ____________________  What is a fetal movement count? A fetal movement count is the number of times that you feel your baby move during a certain amount of time. This may also be called a fetal kick count. A fetal movement count is recommended for every pregnant woman. You may be asked to start counting fetal movements as early as week 28 of your pregnancy. Pay attention to when your baby is most active. You may notice your baby's sleep and wake cycles. You may also notice things that make your baby move more. You should do a fetal movement count:  When your baby is normally most active.  At the same time each day. A good time to count movements is while you are resting, after having something to eat and drink. How do I count fetal movements? 1. Find a quiet, comfortable area. Sit, or lie down on your side. 2. Write down the date, the start time and stop time, and the number of movements that you felt between those two times. Take this information with you to your health care visits. 3. Write down your start time when you feel the first movement. 4. Count kicks, flutters, swishes, rolls, and jabs. You should feel at least 10 movements. 5. You may stop counting after you have felt 10 movements, or if you have been counting for 2 hours. Write down the stop time. 6. If you do not feel 10 movements in 2 hours, contact your health care provider for further instructions. Your health care provider may want to do additional tests to assess your baby's well-being. Contact a health care provider if:  You feel fewer than 10 movements in 2 hours.  Your baby is not moving like he or she usually does. Date: ____________ Start time: ____________ Stop time: ____________ Movements: ____________ Date: ____________ Start time: ____________ Stop time: ____________ Movements: ____________ Date: ____________  Start time: ____________ Stop time: ____________ Movements: ____________ Date: ____________ Start time: ____________ Stop time: ____________ Movements: ____________ Date: ____________ Start time: ____________ Stop time: ____________ Movements: ____________ Date: ____________ Start time: ____________ Stop time: ____________ Movements: ____________ Date: ____________ Start time: ____________ Stop time: ____________ Movements: ____________ Date: ____________ Start time: ____________ Stop time: ____________ Movements: ____________ Date: ____________ Start time: ____________ Stop time: ____________ Movements: ____________ This information is not intended to replace advice given to you by your health care provider. Make sure you discuss any questions you have with your health care provider. Document Revised: 11/28/2018 Document Reviewed: 11/28/2018 Elsevier Patient Education  2021 Elsevier Inc.  

## 2020-07-30 NOTE — Progress Notes (Signed)
ROB-Reports occasional cramping and round ligament pain. Discussed home treatment measures. Anticipatory guidance regarding course of prenatal care. Reviewed red flag symptoms and when to call. RTC x 2 weeks for 36 weeks cultures and ROB with ANNIE or sooner if needed.

## 2020-07-30 NOTE — Progress Notes (Signed)
ROB: She states has cramps that comes and goes. Otherwise she is doing well.

## 2020-08-07 ENCOUNTER — Observation Stay
Admission: EM | Admit: 2020-08-07 | Discharge: 2020-08-07 | Disposition: A | Discharge status: Home / Self Care | Payer: BC Managed Care – PPO | Attending: Obstetrics and Gynecology | Admitting: Obstetrics and Gynecology

## 2020-08-07 ENCOUNTER — Encounter: Payer: Self-pay | Admitting: Obstetrics and Gynecology

## 2020-08-07 ENCOUNTER — Other Ambulatory Visit: Payer: Self-pay

## 2020-08-07 DIAGNOSIS — Z3A35 35 weeks gestation of pregnancy: Secondary | ICD-10-CM

## 2020-08-07 DIAGNOSIS — O26853 Spotting complicating pregnancy, third trimester: Principal | ICD-10-CM | POA: Insufficient documentation

## 2020-08-07 DIAGNOSIS — Z3490 Encounter for supervision of normal pregnancy, unspecified, unspecified trimester: Secondary | ICD-10-CM

## 2020-08-07 DIAGNOSIS — A530 Latent syphilis, unspecified as early or late: Secondary | ICD-10-CM

## 2020-08-07 LAB — URINALYSIS, ROUTINE W REFLEX MICROSCOPIC
Bilirubin Urine: NEGATIVE
Glucose, UA: NEGATIVE mg/dL
Ketones, ur: NEGATIVE mg/dL
Leukocytes,Ua: NEGATIVE
Nitrite: NEGATIVE
Protein, ur: NEGATIVE mg/dL
Specific Gravity, Urine: 1.013 (ref 1.005–1.030)
pH: 7 (ref 5.0–8.0)

## 2020-08-07 NOTE — OB Triage Note (Addendum)
Pt is a G1P0 and [redacted]w[redacted]d presenting to L&D with c/o vaginal spotting at 2000 today. Pt states she noticed it when she wiped. Pt last intercourse this morning. Pt confirms positive fetal movement and denies LOF. Pt used the bathroom in the triage room and stated there was no blood when she wiped. VSS. Monitors applied and assessing.

## 2020-08-07 NOTE — Progress Notes (Signed)
Pt discharged home and stable at time of discharge with significant other. Red flag labor precautions reviewed. Pt agrees to plan of care.

## 2020-08-07 NOTE — Progress Notes (Signed)
Pt has been monitored in L&D with no more episodes of spotting or bleeding. CNM notified.

## 2020-08-08 NOTE — Discharge Summary (Signed)
Obstetric Discharge Summary  Patient ID: Brenda Mccoy MRN: [id number] DOB/AGE: 23/04/1995 24 y.o.   Date of Admission: 08/07/2020  Date of Discharge: 08/07/2020  Admitting Diagnosis: Observation at [redacted]w[redacted]d  Secondary Diagnosis:   Patient Active Problem List   Diagnosis Date Noted  . Indication for care in labor and delivery, antepartum   . Pregnancy 08/07/2020  . Anemia in pregnancy 06/24/2020  . Positive RPR test 06/24/2020  . Prediabetes 02/16/2020  . Type B blood, Rh positive 02/16/2020  . Obesity in pregnancy 02/16/2020       Discharge Diagnosis: No other diagnosis   Antepartum Procedures: NST and UA    Brief Hospital Course   L&D OB Triage Note  Brenda Mccoy is a 25 y.o. G1P0 female at [redacted]w[redacted]d, EDD Estimated Date of Delivery: 09/06/20 who presented to triage for complaints of spotting with wiping after intercourse earlier today.  She was evaluated by the nurses with no significant findings for maternal/fetal distress or preterm labor. Vital signs stable. An NST was performed and has been reviewed by CNM.   NST INTERPRETATION:  Indications: rule out uterine contractions  Mode: External Baseline Rate (A): 145 bpm (fht) Variability: Moderate Accelerations: 15 x 15 Decelerations: None     Contraction Frequency (min): x1  Impression: reactive   Plan: NST performed was reviewed and was found to be reactive. She was discharged home with bleeding/labor precautions.  Continue routine prenatal care. Follow up with CNM as previously scheduled.    Discharge Instructions: Per After Visit Summary.  Activity:  Also refer to After Visit Summary.  Diet: Regular  Medications: Allergies as of 08/07/2020      Reactions   Miconazole       Medication List    ASK your doctor about these medications   aspirin EC 81 MG tablet Take 1 tablet (81 mg total) by mouth daily. Take after 12 weeks for prevention of preeclampssia later in pregnancy   ferrous sulfate 325 (65  FE) MG tablet Take 325 mg by mouth daily with breakfast.   FLINSTONES GUMMIES OMEGA-3 DHA PO Take by mouth.   folic acid 1 MG tablet Commonly known as: FOLVITE Take 1 tablet (1 mg total) by mouth daily.      Outpatient follow up:   Follow-up Information    [facility] Follow up.   Why: Keep your appointments as scheduled Contact information: [address]  [address] [place] [address] [phone]             Postpartum contraception: Will discuss further  Discharged Condition: stable  Discharged to: home   [name], CNM [facility], [facility] 08/08/20 10:34 AM

## 2020-08-08 NOTE — Discharge Instructions (Signed)
[employer]'s Emergency Medicine: Concepts and Clinical Practice (9th ed., pp. 2296- 2312). Elsevier.">  Braxton Hicks Contractions Contractions of the uterus can occur throughout pregnancy, but they are not always a sign that you are in labor. You may have practice contractions called Braxton Hicks contractions. These false labor contractions are sometimes confused with true labor. What are Braxton Hicks contractions? Braxton Hicks contractions are tightening movements that occur in the muscles of the uterus before labor. Unlike true labor contractions, these contractions do not result in opening (dilation) and thinning of the cervix. Toward the end of pregnancy (32-34 weeks), Braxton Hicks contractions can happen more often and may become stronger. These contractions are sometimes difficult to tell apart from true labor because they can be very uncomfortable. You should not feel embarrassed if you go to the hospital with false labor. Sometimes, the only way to tell if you are in true labor is for your health care provider to look for changes in the cervix. The health care provider will do a physical exam and may monitor your contractions. If you are not in true labor, the exam should show that your cervix is not dilating and your water has not broken. If there are no other health problems associated with your pregnancy, it is completely safe for you to be sent home with false labor. You may continue to have Braxton Hicks contractions until you go into true labor. How to tell the difference between true labor and false labor True labor  Contractions last 30-70 seconds.  Contractions become very regular.  Discomfort is usually felt in the top of the uterus, and it spreads to the lower abdomen and low back.  Contractions do not go away with walking.  Contractions usually become more intense and increase in frequency.  The cervix dilates and gets thinner. False labor  Contractions are usually shorter  and not as strong as true labor contractions.  Contractions are usually irregular.  Contractions are often felt in the front of the lower abdomen and in the groin.  Contractions may go away when you walk around or change positions while lying down.  Contractions get weaker and are shorter-lasting as time goes on.  The cervix usually does not dilate or become thin. Follow these instructions at home:  Take over-the-counter and prescription medicines only as told by your health care provider.  Keep up with your usual exercises and follow other instructions from your health care provider.  Eat and drink lightly if you think you are going into labor.  If Braxton Hicks contractions are making you uncomfortable: ? Change your position from lying down or resting to walking, or change from walking to resting. ? Sit and rest in a tub of warm water. ? Drink enough fluid to keep your urine pale yellow. Dehydration may cause these contractions. ? Do slow and deep breathing several times an hour.  Keep all follow-up prenatal visits as told by your health care provider. This is important.   Contact a health care provider if:  You have a fever.  You have continuous pain in your abdomen. Get help right away if:  Your contractions become stronger, more regular, and closer together.  You have fluid leaking or gushing from your vagina.  You pass blood-tinged mucus (bloody show).  You have bleeding from your vagina.  You have low back pain that you never had before.  You feel your baby's head pushing down and causing pelvic pressure.  Your baby is not moving inside   you as much as it used to. Summary  Contractions that occur before labor are called Braxton Hicks contractions, false labor, or practice contractions.  Braxton Hicks contractions are usually shorter, weaker, farther apart, and less regular than true labor contractions. True labor contractions usually become progressively  stronger and regular, and they become more frequent.  Manage discomfort from Braxton Hicks contractions by changing position, resting in a warm bath, drinking plenty of water, or practicing deep breathing. This information is not intended to replace advice given to you by your health care provider. Make sure you discuss any questions you have with your health care provider. Document Revised: 03/23/2017 Document Reviewed: 08/24/2016 Elsevier Patient Education  2021 [employer] Inc.   Fetal Movement Counts Patient Name: ________________________________________________ Patient Due Date: ____________________  What is a fetal movement count? A fetal movement count is the number of times that you feel your baby move during a certain amount of time. This may also be called a fetal kick count. A fetal movement count is recommended for every pregnant woman. You may be asked to start counting fetal movements as early as week 28 of your pregnancy. Pay attention to when your baby is most active. You may notice your baby's sleep and wake cycles. You may also notice things that make your baby move more. You should do a fetal movement count:  When your baby is normally most active.  At the same time each day. A good time to count movements is while you are resting, after having something to eat and drink. How do I count fetal movements? 1. Find a quiet, comfortable area. Sit, or lie down on your side. 2. Write down the date, the start time and stop time, and the number of movements that you felt between those two times. Take this information with you to your health care visits. 3. Write down your start time when you feel the first movement. 4. Count kicks, flutters, swishes, rolls, and jabs. You should feel at least 10 movements. 5. You may stop counting after you have felt 10 movements, or if you have been counting for 2 hours. Write down the stop time. 6. If you do not feel 10 movements in 2 hours, contact  your health care provider for further instructions. Your health care provider may want to do additional tests to assess your baby's well-being. Contact a health care provider if:  You feel fewer than 10 movements in 2 hours.  Your baby is not moving like he or she usually does. Date: ____________ Start time: ____________ Stop time: ____________ Movements: ____________ Date: ____________ Start time: ____________ Stop time: ____________ Movements: ____________ Date: ____________ Start time: ____________ Stop time: ____________ Movements: ____________ Date: ____________ Start time: ____________ Stop time: ____________ Movements: ____________ Date: ____________ Start time: ____________ Stop time: ____________ Movements: ____________ Date: ____________ Start time: ____________ Stop time: ____________ Movements: ____________ Date: ____________ Start time: ____________ Stop time: ____________ Movements: ____________ Date: ____________ Start time: ____________ Stop time: ____________ Movements: ____________ Date: ____________ Start time: ____________ Stop time: ____________ Movements: ____________ This information is not intended to replace advice given to you by your health care provider. Make sure you discuss any questions you have with your health care provider. Document Revised: 11/28/2018 Document Reviewed: 11/28/2018 Elsevier Patient Education  2021 [employer]vier Inc.  

## 2020-08-17 ENCOUNTER — Other Ambulatory Visit: Payer: Self-pay

## 2020-08-17 ENCOUNTER — Encounter: Payer: Self-pay | Admitting: Certified Nurse Midwife

## 2020-08-17 ENCOUNTER — Ambulatory Visit (INDEPENDENT_AMBULATORY_CARE_PROVIDER_SITE_OTHER): Payer: BC Managed Care – PPO | Admitting: Certified Nurse Midwife

## 2020-08-17 VITALS — BP 116/66 | HR 111 | Wt 227.7 lb

## 2020-08-17 DIAGNOSIS — A530 Latent syphilis, unspecified as early or late: Secondary | ICD-10-CM

## 2020-08-17 DIAGNOSIS — Z0289 Encounter for other administrative examinations: Secondary | ICD-10-CM

## 2020-08-17 DIAGNOSIS — Z3A37 37 weeks gestation of pregnancy: Secondary | ICD-10-CM

## 2020-08-17 DIAGNOSIS — Z3403 Encounter for supervision of normal first pregnancy, third trimester: Secondary | ICD-10-CM

## 2020-08-17 LAB — POCT URINALYSIS DIPSTICK OB
Bilirubin, UA: NEGATIVE
Blood, UA: NEGATIVE
Glucose, UA: NEGATIVE
Ketones, UA: NEGATIVE
Nitrite, UA: NEGATIVE
Odor: NEGATIVE
POC,PROTEIN,UA: NEGATIVE
Spec Grav, UA: <=1.005 — AB (ref 1.010–1.025)
Urobilinogen, UA: 0.2 E.U./dL
pH, UA: 8 (ref 5.0–8.0)

## 2020-08-17 NOTE — Progress Notes (Signed)
ROB doing well. Feels good  Movement. GBS and cultures today. Discussed labor precautions. Follow up 1 wk with Marcelino Duster for rob or prn.   Doreene Burke, CNM

## 2020-08-17 NOTE — Patient Instructions (Signed)

## 2020-08-17 NOTE — Progress Notes (Signed)
ROB- 36 weeks cultures. RPR today.

## 2020-08-18 LAB — RPR: RPR Ser Ql: NONREACTIVE

## 2020-08-19 LAB — STREP GP B NAA: Strep Gp B NAA: NEGATIVE

## 2020-08-19 LAB — GC/CHLAMYDIA PROBE AMP
Chlamydia trachomatis, NAA: NEGATIVE
Neisseria Gonorrhoeae by PCR: NEGATIVE

## 2020-08-23 ENCOUNTER — Ambulatory Visit (INDEPENDENT_AMBULATORY_CARE_PROVIDER_SITE_OTHER): Payer: BC Managed Care – PPO | Admitting: Certified Nurse Midwife

## 2020-08-23 ENCOUNTER — Encounter: Payer: Self-pay | Admitting: Certified Nurse Midwife

## 2020-08-23 ENCOUNTER — Other Ambulatory Visit: Payer: Self-pay

## 2020-08-23 VITALS — BP 107/69 | HR 99 | Wt 230.2 lb

## 2020-08-23 DIAGNOSIS — Z3A38 38 weeks gestation of pregnancy: Secondary | ICD-10-CM

## 2020-08-23 DIAGNOSIS — Z3403 Encounter for supervision of normal first pregnancy, third trimester: Secondary | ICD-10-CM

## 2020-08-23 LAB — POCT URINALYSIS DIPSTICK OB
Bilirubin, UA: NEGATIVE
Blood, UA: NEGATIVE
Glucose, UA: NEGATIVE
Ketones, UA: NEGATIVE
Leukocytes, UA: NEGATIVE
Nitrite, UA: NEGATIVE
POC,PROTEIN,UA: NEGATIVE
Spec Grav, UA: 1.01 (ref 1.010–1.025)
Urobilinogen, UA: 0.2 E.U./dL
pH, UA: 7 (ref 5.0–8.0)

## 2020-08-23 NOTE — Patient Instructions (Signed)
Vaginal Delivery  Vaginal delivery means that you give birth by pushing your baby out of your birth canal (vagina). A team of health care providers will help you before, during, and after vaginal delivery. Birth experiences are unique for every woman and every pregnancy, and birth experiences vary depending on where you choose to give birth. What happens when I arrive at the birth center or hospital? Once you are in labor and have been admitted into the hospital or birth center, your health care provider may:  Review your pregnancy history and any concerns that you have.  Insert an IV into one of your veins. This may be used to give you fluids and medicines.  Check your blood pressure, pulse, temperature, and heart rate (vital signs).  Check whether your bag of water (amniotic sac) has broken (ruptured).  Talk with you about your birth plan and discuss pain control options. Monitoring Your health care provider may monitor your contractions (uterine monitoring) and your baby's heart rate (fetal monitoring). You may need to be monitored:  Often, but not continuously (intermittently).  All the time or for long periods at a time (continuously). Continuous monitoring may be needed if: ? You are taking certain medicines, such as medicine to relieve pain or make your contractions stronger. ? You have pregnancy or labor complications. Monitoring may be done by:  Placing a special stethoscope or a handheld monitoring device on your abdomen to check your baby's heartbeat and to check for contractions.  Placing monitors on your abdomen (external monitors) to record your baby's heartbeat and the frequency and length of contractions.  Placing monitors inside your uterus through your vagina (internal monitors) to record your baby's heartbeat and the frequency, length, and strength of your contractions. Depending on the type of monitor, it may remain in your uterus or on your baby's head until  birth.  Telemetry. This is a type of continuous monitoring that can be done with external or internal monitors. Instead of having to stay in bed, you are able to move around during telemetry. Physical exam Your health care provider may perform frequent physical exams. This may include:  Checking how and where your baby is positioned in your uterus.  Checking your cervix to determine: ? Whether it is thinning out (effacing). ? Whether it is opening up (dilating). What happens during labor and delivery? Normal labor and delivery is divided into the following three stages: Stage 1  This is the longest stage of labor.  This stage can last for hours or days.  Throughout this stage, you will feel contractions. Contractions generally feel mild, infrequent, and irregular at first. They get stronger, more frequent (about every 2-3 minutes), and more regular as you move through this stage.  This stage ends when your cervix is completely dilated to 4 inches (10 cm) and completely effaced. Stage 2  This stage starts once your cervix is completely effaced and dilated and lasts until the delivery of your baby.  This stage may last from 20 minutes to 2 hours.  This is the stage where you will feel an urge to push your baby out of your vagina.  You may feel stretching and burning pain, especially when the widest part of your baby's head passes through the vaginal opening (crowning).  Once your baby is delivered, the umbilical cord will be clamped and cut. This usually occurs after waiting a period of 1-2 minutes after delivery.  Your baby will be placed on your bare chest (skin-to-skin   contact) in an upright position and covered with a warm blanket. Watch your baby for feeding cues, like rooting or sucking, and help the baby to your breast for his or her first feeding. Stage 3  This stage starts immediately after the birth of your baby and ends after you deliver the placenta.  This stage may  take anywhere from 5 to 30 minutes.  After your baby has been delivered, you will feel contractions as your body expels the placenta and your uterus contracts to control bleeding.   What can I expect after labor and delivery?  After labor is over, you and your baby will be monitored closely until you are ready to go home to ensure that you are both healthy. Your health care team will teach you how to care for yourself and your baby.  You and your baby will stay in the same room (rooming in) during your hospital stay. This will encourage early bonding and successful breastfeeding.  You may continue to receive fluids and medicines through an IV.  Your uterus will be checked and massaged regularly (fundal massage).  You will have some soreness and pain in your abdomen, vagina, and the area of skin between your vaginal opening and your anus (perineum).  If an incision was made near your vagina (episiotomy) or if you had some vaginal tearing during delivery, cold compresses may be placed on your episiotomy or your tear. This helps to reduce pain and swelling.  You may be given a squirt bottle to use instead of wiping when you go to the bathroom. To use the squirt bottle, follow these steps: ? Before you urinate, fill the squirt bottle with warm water. Do not use hot water. ? After you urinate, while you are sitting on the toilet, use the squirt bottle to rinse the area around your urethra and vaginal opening. This rinses away any urine and blood. ? Fill the squirt bottle with clean water every time you use the bathroom.  It is normal to have vaginal bleeding after delivery. Wear a sanitary pad for vaginal bleeding and discharge. Summary  Vaginal delivery means that you will give birth by pushing your baby out of your birth canal (vagina).  Your health care provider may monitor your contractions (uterine monitoring) and your baby's heart rate (fetal monitoring).  Your health care provider may  perform a physical exam.  Normal labor and delivery is divided into three stages.  After labor is over, you and your baby will be monitored closely until you are ready to go home. This information is not intended to replace advice given to you by your health care provider. Make sure you discuss any questions you have with your health care provider. Document Revised: 05/15/2017 Document Reviewed: 05/15/2017 Elsevier Patient Education  2021 Elsevier Inc.    Fetal Movement Counts Patient Name: ________________________________________________ Patient Due Date: ____________________  What is a fetal movement count? A fetal movement count is the number of times that you feel your baby move during a certain amount of time. This may also be called a fetal kick count. A fetal movement count is recommended for every pregnant woman. You may be asked to start counting fetal movements as early as week 28 of your pregnancy. Pay attention to when your baby is most active. You may notice your baby's sleep and wake cycles. You may also notice things that make your baby move more. You should do a fetal movement count:  When your baby is normally  most active.  At the same time each day. A good time to count movements is while you are resting, after having something to eat and drink. How do I count fetal movements? 1. Find a quiet, comfortable area. Sit, or lie down on your side. 2. Write down the date, the start time and stop time, and the number of movements that you felt between those two times. Take this information with you to your health care visits. 3. Write down your start time when you feel the first movement. 4. Count kicks, flutters, swishes, rolls, and jabs. You should feel at least 10 movements. 5. You may stop counting after you have felt 10 movements, or if you have been counting for 2 hours. Write down the stop time. 6. If you do not feel 10 movements in 2 hours, contact your health care  provider for further instructions. Your health care provider may want to do additional tests to assess your baby's well-being. Contact a health care provider if:  You feel fewer than 10 movements in 2 hours.  Your baby is not moving like he or she usually does. Date: ____________ Start time: ____________ Stop time: ____________ Movements: ____________ Date: ____________ Start time: ____________ Stop time: ____________ Movements: ____________ Date: ____________ Start time: ____________ Stop time: ____________ Movements: ____________ Date: ____________ Start time: ____________ Stop time: ____________ Movements: ____________ Date: ____________ Start time: ____________ Stop time: ____________ Movements: ____________ Date: ____________ Start time: ____________ Stop time: ____________ Movements: ____________ Date: ____________ Start time: ____________ Stop time: ____________ Movements: ____________ Date: ____________ Start time: ____________ Stop time: ____________ Movements: ____________ Date: ____________ Start time: ____________ Stop time: ____________ Movements: ____________ This information is not intended to replace advice given to you by your health care provider. Make sure you discuss any questions you have with your health care provider. Document Revised: 11/28/2018 Document Reviewed: 11/28/2018 Elsevier Patient Education  2021 [employer].

## 2020-08-23 NOTE — Progress Notes (Signed)
ROB: She is doing well. She has no new concerns. 

## 2020-08-26 ENCOUNTER — Telehealth: Payer: Self-pay | Admitting: Certified Nurse Midwife

## 2020-08-26 DIAGNOSIS — Z3A38 38 weeks gestation of pregnancy: Secondary | ICD-10-CM

## 2020-08-26 DIAGNOSIS — O368130 Decreased fetal movements, third trimester, not applicable or unspecified: Secondary | ICD-10-CM

## 2020-08-26 DIAGNOSIS — Z3403 Encounter for supervision of normal first pregnancy, third trimester: Secondary | ICD-10-CM

## 2020-08-26 NOTE — Telephone Encounter (Signed)
Telephone call to patient, verified full name and date of birth.   Discussed fetal movement changes in pregnancy. Advised kick counts.   Reviewed red flag symptoms and when to call.    Brenda Mccoy, CNM [facility], [facility] 08/26/20 10:14 AM

## 2020-08-26 NOTE — Progress Notes (Signed)
ROB-Doing well, no questions or concerns. Reconsidering waterbirth. Emotional support provided. Anticipatory guidance regarding course of prenatal care. Reviewed red flag symptoms and when to call. RTC x 1 week for ROB or sooner if needed.

## 2020-08-29 ENCOUNTER — Observation Stay
Admission: EM | Admit: 2020-08-29 | Discharge: 2020-08-29 | Disposition: A | Discharge status: Home / Self Care | Payer: BC Managed Care – PPO | Attending: Obstetrics and Gynecology | Admitting: Obstetrics and Gynecology

## 2020-08-29 ENCOUNTER — Encounter: Payer: Self-pay | Admitting: Obstetrics and Gynecology

## 2020-08-29 ENCOUNTER — Other Ambulatory Visit: Payer: Self-pay

## 2020-08-29 DIAGNOSIS — A530 Latent syphilis, unspecified as early or late: Secondary | ICD-10-CM

## 2020-08-29 DIAGNOSIS — O99213 Obesity complicating pregnancy, third trimester: Secondary | ICD-10-CM | POA: Insufficient documentation

## 2020-08-29 DIAGNOSIS — O99013 Anemia complicating pregnancy, third trimester: Secondary | ICD-10-CM | POA: Diagnosis not present

## 2020-08-29 DIAGNOSIS — A539 Syphilis, unspecified: Secondary | ICD-10-CM | POA: Diagnosis not present

## 2020-08-29 DIAGNOSIS — D649 Anemia, unspecified: Secondary | ICD-10-CM | POA: Insufficient documentation

## 2020-08-29 DIAGNOSIS — O98113 Syphilis complicating pregnancy, third trimester: Secondary | ICD-10-CM | POA: Insufficient documentation

## 2020-08-29 DIAGNOSIS — E669 Obesity, unspecified: Secondary | ICD-10-CM | POA: Insufficient documentation

## 2020-08-29 DIAGNOSIS — R7303 Prediabetes: Secondary | ICD-10-CM | POA: Diagnosis not present

## 2020-08-29 DIAGNOSIS — Z3A38 38 weeks gestation of pregnancy: Secondary | ICD-10-CM

## 2020-08-29 DIAGNOSIS — O360131 Maternal care for anti-D [Rh] antibodies, third trimester, fetus 1: Secondary | ICD-10-CM | POA: Diagnosis not present

## 2020-08-29 DIAGNOSIS — O471 False labor at or after 37 completed weeks of gestation: Principal | ICD-10-CM

## 2020-08-29 HISTORY — DX: Anemia, unspecified: D64.9

## 2020-08-29 MED ORDER — ACETAMINOPHEN 500 MG PO TABS
1000.0000 mg | ORAL_TABLET | Freq: Once | ORAL | Status: AC
  Administered 2020-08-29: 1000 mg via ORAL
  Filled 2020-08-29: qty 2

## 2020-08-29 MED ORDER — HYDROXYZINE HCL 50 MG PO TABS
50.0000 mg | ORAL_TABLET | Freq: Once | ORAL | Status: AC
  Administered 2020-08-29: 50 mg via ORAL
  Filled 2020-08-29: qty 1

## 2020-08-29 NOTE — OB Triage Note (Signed)
"  Menstrual like cramps since yesterday." Denies LOF, vaginal bleeding. Reports + fetal movement. Elaina Hoops

## 2020-08-29 NOTE — Progress Notes (Signed)
Discharge home. Discharge instructions reviewed. Labor precautions reviewed. Questions answered. Left floor ambulatory with significant other. Elaina Hoops

## 2020-08-30 ENCOUNTER — Other Ambulatory Visit: Payer: Self-pay

## 2020-08-30 ENCOUNTER — Ambulatory Visit (INDEPENDENT_AMBULATORY_CARE_PROVIDER_SITE_OTHER): Payer: BC Managed Care – PPO | Admitting: Certified Nurse Midwife

## 2020-08-30 VITALS — BP 124/83 | HR 106 | Wt 232.4 lb

## 2020-08-30 DIAGNOSIS — Z3403 Encounter for supervision of normal first pregnancy, third trimester: Secondary | ICD-10-CM

## 2020-08-30 DIAGNOSIS — Z3A39 39 weeks gestation of pregnancy: Secondary | ICD-10-CM

## 2020-08-30 LAB — POCT URINALYSIS DIPSTICK OB
Bilirubin, UA: NEGATIVE
Blood, UA: NEGATIVE
Glucose, UA: NEGATIVE
Ketones, UA: NEGATIVE
Leukocytes, UA: NEGATIVE
Nitrite, UA: NEGATIVE
Spec Grav, UA: 1.01 (ref 1.010–1.025)
Urobilinogen, UA: 0.2 E.U./dL
pH, UA: 7.5 (ref 5.0–8.0)

## 2020-08-30 NOTE — Patient Instructions (Signed)
First Stage of Labor Labor is your body's natural process of moving your baby and other structures, including the placenta and umbilical cord, out of your uterus. There are three stages of labor. How long each stage lasts is different for every woman. But certain events happen during each stage that are the same for everyone.  The first stage starts when true labor begins. This stage ends when your cervix, which is the opening from your uterus into your vagina, is completely open (dilated).  The second stage begins when your cervix is fully dilated and you start pushing. This stage ends when your baby is born.  The third stage is the delivery of the organ that nourished your baby during pregnancy (placenta). First stage of labor As your due date gets closer, you may start to notice certain physical changes that mean labor is going to start soon. You may feel that your baby has dropped lower into your pelvis. You may experience irregular, often painless, contractions that go away when you walk around or lie down (Braxton Hicks contractions). This is also called false labor. The first stage of labor begins when you start having contractions that come at regular (evenly spaced) intervals and your cervix starts to get thinner and wider in preparation for your baby to pass through. Birth care providers measure the dilation of your cervix in centimeters (cm). One centimeter is a little less than one-half of an inch. The first stage ends when your cervix is dilated to 10 cm. The first stage of labor is divided into three phases:  Early phase.  Active phase.  Transitional phase. The length of the first stage of labor varies. It may be longer if this is your first pregnancy. You may spend most of this stage at home trying to relax and stay comfortable. How does this affect me? During the first stage of labor, you will move through three phases. What happens in the early phase?  You will start to have  regular contractions that last 30-60 seconds. Contractions may come every 5-20 minutes. Keep track of your contractions and call your birth care provider.  Your water may break during this phase.  You may notice a clear or slightly bloody discharge of mucus (mucus plug) from your vagina.  Your cervix will dilate to 3-6 cm. What happens in the active phase? The active phase usually lasts 3-5 hours. You may go to the hospital or birth center around this time. During the active phase:  Your contractions will become stronger, longer, and more uncomfortable.  Your contractions may last 45-90 seconds and come every 3-5 minutes.  You may feel lower back pain.  Your birth care providers may examine your cervix and feel your belly to find the position of your baby.  You may have a monitor strapped to your belly to measure your contractions and your baby's heart rate.  You may start using your pain management options.  Your cervix may be dilated to 6 cm and may start to dilate more quickly. What happens in the transitional phase? The transitional phase typically lasts from 30 minutes to 2 hours. At the end of this phase, your cervix will be fully dilated to 10 cm. During the transitional phase:  Contractions will get stronger and longer.  Contractions may last 60-90 seconds and come less than 2 minutes apart.  You may feel hot flashes, chills, or nausea. How does this affect my baby? During the first stage of labor, your baby will   gradually move down into your birth canal. Follow these instructions at home and in the hospital or birth center:  When labor first begins, try to stay calm. You are still in the early phase. If it is night, try to get some sleep. If it is day, try to relax and save your energy. You may want to make some calls and get ready to go to the hospital or birth center.  When you are in the early phase, try these methods to help ease discomfort: ? Deep breathing and  muscle relaxation. ? Taking a walk. ? Taking a warm bath or shower.  Drink some fluids and have a light snack if you feel like it.  Keep track of your contractions.  Based on the plan you created with your birth care provider, call when your contractions indicate it is time.  If your water breaks, note the time, color, and odor of the fluid.  When you are in the active phase, do your breathing exercises and rely on your support people and your team of birth care providers.   Contact a health care provider if:  Your contractions are strong and regular.  You have lower back pain or cramping.  Your water breaks.  You lose your mucus plug. Get help right away if you:  Have a severe headache that does not go away.  Have changes in your vision.  Have severe pain in your upper belly.  Do not feel the baby move.  Have bright red bleeding. Summary  The first stage of labor starts when true labor begins, and it ends when your cervix is dilated to 10 cm.  The first stage of labor has three phases: early, active, and transitional.  Your baby moves into the birth canal during the first stage of labor.  You may have contractions that become stronger and longer. You may also lose your mucus plug and have your water break.  Call your birth care provider when your contractions are frequent and strong enough to go to the hospital or birth center. This information is not intended to replace advice given to you by your health care provider. Make sure you discuss any questions you have with your health care provider. Document Revised: 08/01/2018 Document Reviewed: 06/24/2017 Elsevier Patient Education  2021 Elsevier Inc.   Fetal Movement Counts Patient Name: ________________________________________________ Patient Due Date: ____________________  What is a fetal movement count? A fetal movement count is the number of times that you feel your baby move during a certain amount of time.  This may also be called a fetal kick count. A fetal movement count is recommended for every pregnant woman. You may be asked to start counting fetal movements as early as week 28 of your pregnancy. Pay attention to when your baby is most active. You may notice your baby's sleep and wake cycles. You may also notice things that make your baby move more. You should do a fetal movement count:  When your baby is normally most active.  At the same time each day. A good time to count movements is while you are resting, after having something to eat and drink. How do I count fetal movements? 1. Find a quiet, comfortable area. Sit, or lie down on your side. 2. Write down the date, the start time and stop time, and the number of movements that you felt between those two times. Take this information with you to your health care visits. 3. Write down your start time   when you feel the first movement. 4. Count kicks, flutters, swishes, rolls, and jabs. You should feel at least 10 movements. 5. You may stop counting after you have felt 10 movements, or if you have been counting for 2 hours. Write down the stop time. 6. If you do not feel 10 movements in 2 hours, contact your health care provider for further instructions. Your health care provider may want to do additional tests to assess your baby's well-being. Contact a health care provider if:  You feel fewer than 10 movements in 2 hours.  Your baby is not moving like he or she usually does. Date: ____________ Start time: ____________ Stop time: ____________ Movements: ____________ Date: ____________ Start time: ____________ Stop time: ____________ Movements: ____________ Date: ____________ Start time: ____________ Stop time: ____________ Movements: ____________ Date: ____________ Start time: ____________ Stop time: ____________ Movements: ____________ Date: ____________ Start time: ____________ Stop time: ____________ Movements: ____________ Date:  ____________ Start time: ____________ Stop time: ____________ Movements: ____________ Date: ____________ Start time: ____________ Stop time: ____________ Movements: ____________ Date: ____________ Start time: ____________ Stop time: ____________ Movements: ____________ Date: ____________ Start time: ____________ Stop time: ____________ Movements: ____________ This information is not intended to replace advice given to you by your health care provider. Make sure you discuss any questions you have with your health care provider. Document Revised: 11/28/2018 Document Reviewed: 11/28/2018 Elsevier Patient Education  2021 Elsevier Inc.  

## 2020-08-30 NOTE — Progress Notes (Signed)
ROB: She has a cough today, no fever. She has no new concerns.

## 2020-08-30 NOTE — Progress Notes (Signed)
ROB-Reports occasional coughing; sibling exposed to COVID at school. Encouraged ambulatory/outpatient testing. Reports irregular contractions since triage visit yesterday, declines SVE. Anticipatory guidance regarding the course of prenatal care. Reviewed red flag symptoms and when to call. RTC x 1 week for NST and ROB or sooner if needed.

## 2020-09-03 ENCOUNTER — Encounter: Payer: Self-pay | Admitting: Certified Nurse Midwife

## 2020-09-03 DIAGNOSIS — O471 False labor at or after 37 completed weeks of gestation: Secondary | ICD-10-CM

## 2020-09-03 DIAGNOSIS — U071 COVID-19: Secondary | ICD-10-CM | POA: Insufficient documentation

## 2020-09-03 HISTORY — DX: COVID-19: U07.1

## 2020-09-03 NOTE — Discharge Summary (Signed)
Obstetric Discharge Summary  Patient ID: Brenda Mccoy MRN: [id number] DOB/AGE: [birth year] 25 y.o.   Date of Admission: 08/29/2020  Date of Discharge: 08/29/2020  Admitting Diagnosis: Observation at [redacted]w[redacted]d  Secondary Diagnosis:   Patient Active Problem List   Diagnosis Date Noted  . COVID-19 09/03/2020  . Pregnancy 08/07/2020  . Anemia in pregnancy 06/24/2020  . Positive RPR test 06/24/2020  . Prediabetes 02/16/2020  . Type B blood, Rh positive 02/16/2020  . Obesity in pregnancy 02/16/2020       Discharge Diagnosis: No other diagnosis   Antepartum Procedures: NST and medication, see [facility]    Brief Hospital Course   L&D OB Triage Note  Brenda Mccoy is a 25 y.o. G1P0 female at [redacted]w[redacted]d, EDD Estimated Date of Delivery: 09/06/20 who presented to triage for complaints of menstrual like cramping.  She was evaluated by the nurses with no significant findings for maternal/fetal distress or labor. Vital signs stable. An NST was performed and has been reviewed by CNM. She was treated with Tylenol and Vistaril, see MAR.   NST INTERPRETATION:  Indications: rule out uterine contractions  Mode: External Baseline Rate (A): 135 bpm Variability: Moderate Accelerations: 15 x 15 Decelerations: None Contraction Frequency (min): 4.5-6.5  Impression: reactive  Dilation: Fingertip Effacement (%): 30 Cervical Position: Posterior Station: -3 Presentation: Undeterminable Exam by:: LSE   Plan: NST performed was reviewed and was found to be reactive. She was discharged home with bleeding/labor precautions.  Continue routine prenatal care. Follow up with CNM as previously scheduled.   Discharge Instructions: Per After Visit Summary.  Activity: Also refer to After Visit Summary.  Diet: Regular  Medications: Allergies as of 08/29/2020      Reactions   Miconazole Itching      Medication List    ASK your doctor about these medications   aspirin EC 81 MG tablet Take 1 tablet (81 mg  total) by mouth daily. Take after 12 weeks for prevention of preeclampssia later in pregnancy   FLINSTONES GUMMIES OMEGA-3 DHA PO Take by mouth.      Outpatient follow up: As previously scheduled or sooner if needed  Postpartum contraception: Unsure  Discharged Condition: stable  Discharged to: home   [name], CNM  [facility], [facility]

## 2020-09-04 ENCOUNTER — Inpatient Hospital Stay
Admission: EM | Admit: 2020-09-04 | Discharge: 2020-09-07 | DRG: 786 | Disposition: A | Discharge status: Home / Self Care | Payer: BC Managed Care – PPO | Attending: Certified Nurse Midwife | Admitting: Certified Nurse Midwife

## 2020-09-04 ENCOUNTER — Inpatient Hospital Stay: Payer: BC Managed Care – PPO | Admitting: Anesthesiology

## 2020-09-04 ENCOUNTER — Encounter: Payer: Self-pay | Admitting: Obstetrics and Gynecology

## 2020-09-04 ENCOUNTER — Other Ambulatory Visit: Payer: Self-pay

## 2020-09-04 DIAGNOSIS — O9902 Anemia complicating childbirth: Secondary | ICD-10-CM | POA: Diagnosis present

## 2020-09-04 DIAGNOSIS — O4292 Full-term premature rupture of membranes, unspecified as to length of time between rupture and onset of labor: Principal | ICD-10-CM | POA: Diagnosis present

## 2020-09-04 DIAGNOSIS — O99019 Anemia complicating pregnancy, unspecified trimester: Secondary | ICD-10-CM | POA: Diagnosis present

## 2020-09-04 DIAGNOSIS — O9852 Other viral diseases complicating childbirth: Secondary | ICD-10-CM | POA: Diagnosis present

## 2020-09-04 DIAGNOSIS — Z8759 Personal history of other complications of pregnancy, childbirth and the puerperium: Secondary | ICD-10-CM

## 2020-09-04 DIAGNOSIS — O4202 Full-term premature rupture of membranes, onset of labor within 24 hours of rupture: Secondary | ICD-10-CM | POA: Diagnosis not present

## 2020-09-04 DIAGNOSIS — U071 COVID-19: Secondary | ICD-10-CM | POA: Diagnosis present

## 2020-09-04 DIAGNOSIS — O368330 Maternal care for abnormalities of the fetal heart rate or rhythm, third trimester, not applicable or unspecified: Secondary | ICD-10-CM | POA: Diagnosis not present

## 2020-09-04 DIAGNOSIS — O09299 Supervision of pregnancy with other poor reproductive or obstetric history, unspecified trimester: Secondary | ICD-10-CM | POA: Diagnosis not present

## 2020-09-04 DIAGNOSIS — O99013 Anemia complicating pregnancy, third trimester: Principal | ICD-10-CM

## 2020-09-04 DIAGNOSIS — Z3A39 39 weeks gestation of pregnancy: Secondary | ICD-10-CM | POA: Diagnosis not present

## 2020-09-04 DIAGNOSIS — A530 Latent syphilis, unspecified as early or late: Secondary | ICD-10-CM

## 2020-09-04 DIAGNOSIS — O99210 Obesity complicating pregnancy, unspecified trimester: Secondary | ICD-10-CM | POA: Diagnosis present

## 2020-09-04 DIAGNOSIS — O99213 Obesity complicating pregnancy, third trimester: Secondary | ICD-10-CM | POA: Diagnosis present

## 2020-09-04 DIAGNOSIS — O99214 Obesity complicating childbirth: Secondary | ICD-10-CM | POA: Diagnosis present

## 2020-09-04 DIAGNOSIS — Z6720 Type B blood, Rh positive: Secondary | ICD-10-CM | POA: Diagnosis present

## 2020-09-04 DIAGNOSIS — O26893 Other specified pregnancy related conditions, third trimester: Secondary | ICD-10-CM | POA: Diagnosis present

## 2020-09-04 DIAGNOSIS — O368390 Maternal care for abnormalities of the fetal heart rate or rhythm, unspecified trimester, not applicable or unspecified: Secondary | ICD-10-CM

## 2020-09-04 HISTORY — DX: Personal history of other complications of pregnancy, childbirth and the puerperium: Z87.59

## 2020-09-04 LAB — TYPE AND SCREEN
ABO/RH(D): B POS
Antibody Screen: NEGATIVE

## 2020-09-04 LAB — RAPID HIV SCREEN (HIV 1/2 AB+AG)
HIV 1/2 Antibodies: NONREACTIVE
HIV-1 P24 Antigen - HIV24: NONREACTIVE

## 2020-09-04 LAB — CBC
HCT: 36 % (ref 36.0–46.0)
Hemoglobin: 11.8 g/dL — ABNORMAL LOW (ref 12.0–15.0)
MCH: 28.2 pg (ref 26.0–34.0)
MCHC: 32.8 g/dL (ref 30.0–36.0)
MCV: 86.1 fL (ref 80.0–100.0)
Platelets: 213 10*3/uL (ref 150–400)
RBC: 4.18 MIL/uL (ref 3.87–5.11)
RDW: 13.8 % (ref 11.5–15.5)
WBC: 6.7 10*3/uL (ref 4.0–10.5)
nRBC: 0 % (ref 0.0–0.2)

## 2020-09-04 LAB — RUPTURE OF MEMBRANE (ROM)PLUS: Rom Plus: POSITIVE

## 2020-09-04 MED ORDER — BUPIVACAINE HCL (PF) 0.25 % IJ SOLN
INTRAMUSCULAR | Status: DC | PRN
  Administered 2020-09-04: 3 mL via EPIDURAL
  Administered 2020-09-04: 5 mL via EPIDURAL

## 2020-09-04 MED ORDER — LACTATED RINGERS IV SOLN: INTRAVENOUS | Status: DC

## 2020-09-04 MED ORDER — ACETAMINOPHEN 325 MG PO TABS: 650.0000 mg | ORAL_TABLET | ORAL | Status: DC | PRN

## 2020-09-04 MED ORDER — FENTANYL 2.5 MCG/ML W/ROPIVACAINE 0.15% IN NS 100 ML EPIDURAL (ARMC)
12.0000 mL/h | EPIDURAL | Status: DC
  Administered 2020-09-04: 12 mL/h via EPIDURAL
  Filled 2020-09-04: qty 100

## 2020-09-04 MED ORDER — OXYTOCIN-SODIUM CHLORIDE 30-0.9 UT/500ML-% IV SOLN
INTRAVENOUS | Status: AC
  Filled 2020-09-04: qty 1000

## 2020-09-04 MED ORDER — FENTANYL 2.5 MCG/ML W/ROPIVACAINE 0.15% IN NS 100 ML EPIDURAL (ARMC)
EPIDURAL | Status: AC
  Filled 2020-09-04: qty 100

## 2020-09-04 MED ORDER — MISOPROSTOL 25 MCG QUARTER TABLET
25.0000 ug | ORAL_TABLET | ORAL | Status: DC
  Administered 2020-09-04: 25 ug via ORAL

## 2020-09-04 MED ORDER — PHENYLEPHRINE 40 MCG/ML (10ML) SYRINGE FOR IV PUSH (FOR BLOOD PRESSURE SUPPORT): 80.0000 ug | PREFILLED_SYRINGE | INTRAVENOUS | Status: DC | PRN

## 2020-09-04 MED ORDER — LIDOCAINE HCL (PF) 1 % IJ SOLN
INTRAMUSCULAR | Status: AC
  Filled 2020-09-04: qty 30

## 2020-09-04 MED ORDER — ONDANSETRON HCL 4 MG/2ML IJ SOLN
4.0000 mg | Freq: Four times a day (QID) | INTRAMUSCULAR | Status: DC | PRN
  Administered 2020-09-05: 4 mg via INTRAVENOUS
  Filled 2020-09-04: qty 2

## 2020-09-04 MED ORDER — TERBUTALINE SULFATE 1 MG/ML IJ SOLN: 0.2500 mg | Freq: Once | INTRAMUSCULAR | Status: DC | PRN

## 2020-09-04 MED ORDER — AMMONIA AROMATIC IN INHA
RESPIRATORY_TRACT | Status: AC
  Filled 2020-09-04: qty 10

## 2020-09-04 MED ORDER — EPHEDRINE 5 MG/ML INJ: 10.0000 mg | INTRAVENOUS | Status: DC | PRN

## 2020-09-04 MED ORDER — OXYTOCIN-SODIUM CHLORIDE 30-0.9 UT/500ML-% IV SOLN
1.0000 m[IU]/min | INTRAVENOUS | Status: DC
  Administered 2020-09-04: 2 m[IU]/min via INTRAVENOUS

## 2020-09-04 MED ORDER — MISOPROSTOL 25 MCG QUARTER TABLET
ORAL_TABLET | ORAL | Status: AC
  Filled 2020-09-04: qty 1

## 2020-09-04 MED ORDER — LIDOCAINE-EPINEPHRINE (PF) 1.5 %-1:200000 IJ SOLN
INTRAMUSCULAR | Status: DC | PRN
  Administered 2020-09-04: 4 mL via PERINEURAL

## 2020-09-04 MED ORDER — LIDOCAINE HCL (PF) 1 % IJ SOLN: 30.0000 mL | INTRAMUSCULAR | Status: DC | PRN

## 2020-09-04 MED ORDER — OXYCODONE-ACETAMINOPHEN 5-325 MG PO TABS: 1.0000 | ORAL_TABLET | ORAL | Status: DC | PRN

## 2020-09-04 MED ORDER — OXYTOCIN-SODIUM CHLORIDE 30-0.9 UT/500ML-% IV SOLN
2.5000 [IU]/h | INTRAVENOUS | Status: DC
  Administered 2020-09-05: 2.5 [IU]/h via INTRAVENOUS
  Filled 2020-09-04: qty 500

## 2020-09-04 MED ORDER — DIPHENHYDRAMINE HCL 50 MG/ML IJ SOLN: 12.5000 mg | INTRAMUSCULAR | Status: DC | PRN

## 2020-09-04 MED ORDER — OXYTOCIN 10 UNIT/ML IJ SOLN
INTRAMUSCULAR | Status: AC
  Filled 2020-09-04: qty 2

## 2020-09-04 MED ORDER — OXYCODONE-ACETAMINOPHEN 5-325 MG PO TABS: 2.0000 | ORAL_TABLET | ORAL | Status: DC | PRN

## 2020-09-04 MED ORDER — LACTATED RINGERS IV SOLN
500.0000 mL | INTRAVENOUS | Status: DC | PRN
  Administered 2020-09-05 (×2): 500 mL via INTRAVENOUS

## 2020-09-04 MED ORDER — MISOPROSTOL 200 MCG PO TABS
ORAL_TABLET | ORAL | Status: AC
  Filled 2020-09-04: qty 4

## 2020-09-04 MED ORDER — LACTATED RINGERS IV SOLN
500.0000 mL | Freq: Once | INTRAVENOUS | Status: AC
  Administered 2020-09-04: 500 mL via INTRAVENOUS

## 2020-09-04 MED ORDER — ZOLPIDEM TARTRATE 5 MG PO TABS: 5.0000 mg | ORAL_TABLET | Freq: Every evening | ORAL | Status: DC | PRN

## 2020-09-04 MED ORDER — LIDOCAINE HCL (PF) 1 % IJ SOLN
INTRAMUSCULAR | Status: DC | PRN
  Administered 2020-09-04: 4 mL

## 2020-09-04 MED ORDER — SOD CITRATE-CITRIC ACID 500-334 MG/5ML PO SOLN: 30.0000 mL | ORAL | Status: DC | PRN

## 2020-09-04 MED ORDER — OXYTOCIN BOLUS FROM INFUSION: 333.0000 mL | Freq: Once | INTRAVENOUS | Status: DC

## 2020-09-04 MED ORDER — BUTORPHANOL TARTRATE 1 MG/ML IJ SOLN
1.0000 mg | INTRAMUSCULAR | Status: DC | PRN
  Administered 2020-09-04: 1 mg via INTRAVENOUS
  Filled 2020-09-04: qty 1

## 2020-09-04 NOTE — H&P (Signed)
Obstetric History and Physical  Brenda Mccoy is a 25 y.o. G1P0 with IUP at [redacted]w[redacted]d presenting with leakage fluid since 0820.   Patient states she has been having  irregular, every five (5) to six (6) minutes contractions, none vaginal bleeding, intact membranes, with active fetal movement.    Denies difficulty breathing or respiratory distress, chest pain, abdominal pain, excessive vaginal bleeding, dysuria, and leg pain.   Prenatal Course  Source of Care: [facility]-initial visit at 9 weeks, total visits: 14   Pregnancy complications or risks: Obesity in pregnancy, Anemia in pregnancy  Prenatal labs and studies:  \ABO, Rh: --/--/B POS (05/14 1100)  Antibody: NEG (05/14 1100)   Rubella: 12.00 (10/12 1438)  Varicella: 318 (10/12 1438)  RPR: Non Reactive (04/26 1216)   HBsAg: Negative (10/12 1438)   HIV: NON REACTIVE (05/14 1100)   YIR:SWNIOEVO/-- (04/26 1351)  1 hr Glucola: 97 (11/22 1110)  Genetic screening: Low risk female (10/25 1350)  Anatomy US: Complete, normal (12/30 1332)  Past Medical History:  Diagnosis Date  . Anemia     Past Surgical History:  Procedure Laterality Date  . ROOT CANAL    . WISDOM TOOTH EXTRACTION     1    OB History  Gravida Para Term Preterm AB Living  1            SAB IAB Ectopic Multiple Live Births               # Outcome Date GA Lbr Len/2nd Weight Sex Delivery Anes PTL Lv  1 Current             Social History   Socioeconomic History  . Marital status: Single    Spouse name: Not on file  . Number of children: Not on file  . Years of education: Not on file  . Highest education level: Not on file  Occupational History  . Not on file  Tobacco Use  . Smoking status: Never Smoker  . Smokeless tobacco: Never Used  Vaping Use  . Vaping Use: Never used  Substance and Sexual Activity  . Alcohol use: Not Currently  . Drug use: Never  . Sexual activity: Yes  Other Topics Concern  . Not on file  Social History Narrative  .  Not on file   Social Determinants of Health   Financial Resource Strain: Not on file  Food Insecurity: Not on file  Transportation Needs: Not on file  Physical Activity: Not on file  Stress: Not on file  Social Connections: Not on file    Family History  Problem Relation Age of Onset  . Hypertension Mother   . Healthy Maternal Grandmother   . Healthy Maternal Grandfather     Medications Prior to Admission  Medication Sig Dispense Refill Last Dose  . aspirin EC 81 MG tablet Take 1 tablet (81 mg total) by mouth daily. Take after 12 weeks for prevention of preeclampssia later in pregnancy 300 tablet 2 Past Month at Unknown time  . Pediatric Multiple Vit-C-FA (FLINSTONES GUMMIES OMEGA-3 DHA PO) Take by mouth.   Past Month at Unknown time  . Iron-FA-B Cmp-C-Biot-Probiotic (FUSION PLUS) CAPS Take by mouth. (Patient not taking: Reported on 09/04/2020)   Not Taking at Unknown time    Allergies  Allergen Reactions  . Miconazole Itching    Review of Systems: Negative except for what is mentioned in HPI.  Physical Exam:  Temp:  [98 F (36.7 C)-98.7 F (37.1 C)] 98 F (36.7 C) (  05/14 1255) Pulse Rate:  [72] 72 (05/14 0904) Resp:  [16] 16 (05/14 0904) BP: (121)/(73) 121/73 (05/14 0904) Weight:  [105.2 kg] 105.2 kg (05/14 0913)  GENERAL: Well-developed, well-nourished female in no acute distress.   LUNGS: Clear to auscultation bilaterally.   HEART: Regular rate and rhythm.  ABDOMEN: Soft, nontender, nondistended, gravid.  EXTREMITIES: Nontender, no edema, 2+ distal pulses.  Cervical Exam: Dilation: 1 Effacement (%): Thick Station: -3 Exam by:: Tourist information centre manager by bedside ultrasound  FHR Category I  Contractions: Occasional, soft resting tone   Pertinent Labs/Studies:   Results for orders placed or performed during the hospital encounter of 09/04/20 (from the past 24 hour(s))  ROM Plus ([facility] only)     Status: None   Collection Time: 09/04/20  9:31 AM  Result Value  Ref Range   Rom Plus POSITIVE   CBC     Status: Abnormal   Collection Time: 09/04/20 11:00 AM  Result Value Ref Range   WBC 6.7 4.0 - 10.5 K/uL   RBC 4.18 3.87 - 5.11 MIL/uL   Hemoglobin 11.8 (L) 12.0 - 15.0 g/dL   HCT 46.9 62.9 - 52.8 %   MCV 86.1 80.0 - 100.0 fL   MCH 28.2 26.0 - 34.0 pg   MCHC 32.8 30.0 - 36.0 g/dL   RDW 41.3 24.4 - 01.0 %   Platelets 213 150 - 400 K/uL   nRBC 0.0 0.0 - 0.2 %  Type and screen [facility]     Status: None   Collection Time: 09/04/20 11:00 AM  Result Value Ref Range   ABO/RH(D) B POS    Antibody Screen NEG    Sample Expiration      09/07/2020,2359 Performed at [facility], [address] [place], [place] [address]   Rapid HIV screen [facility] L&D dept ONLY)     Status: None   Collection Time: 09/04/20 11:00 AM  Result Value Ref Range   HIV-1 P24 Antigen - HIV24 NON REACTIVE NON REACTIVE   HIV 1/2 Antibodies NON REACTIVE NON REACTIVE   Interpretation (HIV Ag Ab)      A non reactive test result means that HIV 1 or HIV 2 antibodies and HIV 1 p24 antigen were not detected in the specimen.    Assessment :  Brenda Mccoy is a 25 y.o. G1P0 at [redacted]w[redacted]d being admitted for full term premature rupture of membranes, Rh positive, GBS negative, COVID positive  FHR Category I  Plan:  Admit to birthing suites, see orders.   Induction/Augmentation as needed, per protocol. Cook cervical ripening balloon placed without difficulty using speculum.   Hopeful for vaginal birth.   Encouraged position changes and use of peanut ball.   Dr. Valentino Saxon notified of admission and plan of care.    Gunnar Bulla, CNM [facility], [facility] 09/04/20 1:10 PM

## 2020-09-04 NOTE — Progress Notes (Signed)
Order requisition for patient who wanted information on Advanced Directive. Because of COVID + status, I left booklet with nursing staff.

## 2020-09-04 NOTE — Progress Notes (Signed)
Patient ID: Brenda Mccoy, female   DOB: 02-02-1996, 25 y.o.   MRN: [id number]  Brenda Mccoy is a 25 y.o. G1P0 at [redacted]w[redacted]d by ultrasound admitted for rupture of membranes  Subjective:  Patient reports painful contractions, every two (2) to three (3) minutes. Requests epidural placement.   FOB at bedside for continuous labor support.   Denies difficulty breathing or respiratory distress, chest pain, dysuria, and leg pain.   Objective:  Temp:  [98 F (36.7 C)-98.7 F (37.1 C)] 98.3 F (36.8 C) (05/14 2120) Pulse Rate:  [62-72] 62 (05/14 1930) Resp:  [16-18] 16 (05/14 1930) BP: (121-138)/(70-75) 138/75 (05/14 1930) Weight:  [105.2 kg] 105.2 kg (05/14 0913)  Fetal Wellbeing:  Category I   UC:   regular, every two (2) to four (4) minutes; soft resting tone  SVE:   Dilation: 1 Effacement (%): Thick Station: -3 Exam by:: [employer]: Lab Results  Component Value Date   WBC 6.7 09/04/2020   HGB 11.8 (L) 09/04/2020   HCT 36.0 09/04/2020   MCV 86.1 09/04/2020   PLT 213 09/04/2020    Assessment:   Brenda Mccoy is a 25 y.o. G1P0 at [redacted]w[redacted]d admitted for full term premature rupture of membranes, Rh positive, GBS negative, COVID positive  FHR Category I  Plan:  Foley bulb removed after gentle traction.   Anesthesia to room for epidural placement.   Plan to recheck cervix and start pitocin once patient is comfortable.   Reviewed red flag symptoms and when to call.   Update given to Dr. Valentino Saxon.    [name], CNM [facility], [facility] 09/04/2020, 9:23 PM

## 2020-09-04 NOTE — Anesthesia Procedure Notes (Signed)
Epidural Patient location during procedure: OB  Staffing Anesthesiologist: Piscitello, Cleda Mccreedy, MD Performed: anesthesiologist   Preanesthetic Checklist Completed: patient identified, IV checked, site marked, risks and benefits discussed, surgical consent, monitors and equipment checked, pre-op evaluation and timeout performed  Epidural Patient position: sitting Prep: ChloraPrep Patient monitoring: heart rate, continuous pulse ox and blood pressure Approach: midline Location: L4-L5 Injection technique: LOR saline  Needle:  Needle type: Tuohy  Needle gauge: 17 G Needle length: 9 cm and 9 Needle insertion depth: 9 cm Catheter type: closed end flexible Catheter size: 19 Gauge Catheter at skin depth: 15 cm Test dose: negative and 1.5% lidocaine with Epi 1:200 K  Assessment Sensory level: T10 Events: blood not aspirated, injection not painful, no injection resistance, no paresthesia and negative IV test  Additional Notes 1st attempt Pt. Evaluated and documentation done after procedure finished. Patient identified. Risks/Benefits/Options discussed with patient including but not limited to bleeding, infection, nerve damage, paralysis, failed block, incomplete pain control, headache, blood pressure changes, nausea, vomiting, reactions to medication both or allergic, itching and postpartum back pain. Confirmed with bedside nurse the patient's most recent platelet count. Confirmed with patient that they are not currently taking any anticoagulation, have any bleeding history or any family history of bleeding disorders. Patient expressed understanding and wished to proceed. All questions were answered. Sterile technique was used throughout the entire procedure. Please see nursing notes for vital signs. Test dose was given through epidural catheter and negative prior to continuing to dose epidural or start infusion. Warning signs of high block given to the patient including shortness of  breath, tingling/numbness in hands, complete motor block, or any concerning symptoms with instructions to call for help. Patient was given instructions on fall risk and not to get out of bed. All questions and concerns addressed with instructions to call with any issues or inadequate analgesia.   Patient tolerated the insertion well without immediate complications.Reason for block:procedure for pain

## 2020-09-04 NOTE — Anesthesia Preprocedure Evaluation (Signed)
Anesthesia Evaluation  Patient identified by MRN, date of birth, ID band Patient awake    Reviewed: Allergy & Precautions, H&P , NPO status , Patient's Chart, lab work & pertinent test results, reviewed documented beta blocker date and time   Airway Mallampati: II  TM Distance: >3 FB Neck ROM: full    Dental no notable dental hx. (+) Teeth Intact   Pulmonary neg pulmonary ROS, Current Smoker,    Pulmonary exam normal breath sounds clear to auscultation       Cardiovascular Exercise Tolerance: Good negative cardio ROS   Rhythm:regular Rate:Normal     Neuro/Psych negative neurological ROS  negative psych ROS   GI/Hepatic negative GI ROS, Neg liver ROS,   Endo/Other  negative endocrine ROSdiabetes, Gestational  Renal/GU negative Renal ROS  negative genitourinary   Musculoskeletal   Abdominal   Peds  Hematology  (+) Blood dyscrasia, anemia ,   Anesthesia Other Findings   Reproductive/Obstetrics (+) Pregnancy                             Anesthesia Physical Anesthesia Plan  ASA: II  Anesthesia Plan: Epidural   Post-op Pain Management:    Induction:   PONV Risk Score and Plan:   Airway Management Planned:   Additional Equipment:   Intra-op Plan:   Post-operative Plan:   Informed Consent: I have reviewed the patients History and Physical, chart, labs and discussed the procedure including the risks, benefits and alternatives for the proposed anesthesia with the patient or authorized representative who has indicated his/her understanding and acceptance.       Plan Discussed with:   Anesthesia Plan Comments:         Anesthesia Quick Evaluation

## 2020-09-05 ENCOUNTER — Encounter: Payer: Self-pay | Admitting: Certified Nurse Midwife

## 2020-09-05 ENCOUNTER — Encounter
Admission: EM | Disposition: A | Discharge status: Home / Self Care | Payer: Self-pay | Source: Home / Self Care | Attending: Certified Nurse Midwife

## 2020-09-05 DIAGNOSIS — O368330 Maternal care for abnormalities of the fetal heart rate or rhythm, third trimester, not applicable or unspecified: Secondary | ICD-10-CM | POA: Diagnosis not present

## 2020-09-05 DIAGNOSIS — O4202 Full-term premature rupture of membranes, onset of labor within 24 hours of rupture: Secondary | ICD-10-CM

## 2020-09-05 DIAGNOSIS — Z3A39 39 weeks gestation of pregnancy: Secondary | ICD-10-CM | POA: Diagnosis not present

## 2020-09-05 DIAGNOSIS — O368390 Maternal care for abnormalities of the fetal heart rate or rhythm, unspecified trimester, not applicable or unspecified: Secondary | ICD-10-CM

## 2020-09-05 LAB — RPR: RPR Ser Ql: NONREACTIVE

## 2020-09-05 SURGERY — Surgical Case
Anesthesia: Spinal

## 2020-09-05 MED ORDER — DIPHENHYDRAMINE HCL 50 MG/ML IJ SOLN: 12.5000 mg | INTRAMUSCULAR | Status: DC | PRN

## 2020-09-05 MED ORDER — FENTANYL CITRATE (PF) 100 MCG/2ML IJ SOLN
INTRAMUSCULAR | Status: AC
  Filled 2020-09-05: qty 2

## 2020-09-05 MED ORDER — ACETAMINOPHEN 500 MG PO TABS
1000.0000 mg | ORAL_TABLET | Freq: Four times a day (QID) | ORAL | Status: DC
  Administered 2020-09-05: 1000 mg via ORAL

## 2020-09-05 MED ORDER — FENTANYL CITRATE (PF) 250 MCG/5ML IJ SOLN
INTRAMUSCULAR | Status: DC | PRN
  Administered 2020-09-05: 15 ug via INTRATHECAL

## 2020-09-05 MED ORDER — NALBUPHINE HCL 10 MG/ML IJ SOLN: 5.0000 mg | Freq: Once | INTRAMUSCULAR | Status: DC | PRN

## 2020-09-05 MED ORDER — COCONUT OIL OIL
1.0000 "application " | TOPICAL_OIL | Status: DC | PRN
  Filled 2020-09-05 (×2): qty 120

## 2020-09-05 MED ORDER — GABAPENTIN 300 MG PO CAPS
300.0000 mg | ORAL_CAPSULE | Freq: Two times a day (BID) | ORAL | Status: DC
  Administered 2020-09-05 – 2020-09-07 (×5): 300 mg via ORAL
  Filled 2020-09-05 (×5): qty 1

## 2020-09-05 MED ORDER — MORPHINE SULFATE (PF) 0.5 MG/ML IJ SOLN
INTRAMUSCULAR | Status: DC | PRN
  Administered 2020-09-05: 10 ug via INTRATHECAL

## 2020-09-05 MED ORDER — ACETAMINOPHEN 500 MG PO TABS
1000.0000 mg | ORAL_TABLET | Freq: Four times a day (QID) | ORAL | Status: DC
  Administered 2020-09-05 (×2): 1000 mg via ORAL
  Filled 2020-09-05 (×2): qty 2

## 2020-09-05 MED ORDER — BUPIVACAINE LIPOSOME 1.3 % IJ SUSP
INTRAMUSCULAR | Status: DC | PRN
  Administered 2020-09-05: 20 mL

## 2020-09-05 MED ORDER — WITCH HAZEL-GLYCERIN EX PADS: 1.0000 "application " | MEDICATED_PAD | CUTANEOUS | Status: DC | PRN

## 2020-09-05 MED ORDER — PRENATAL MULTIVITAMIN CH
1.0000 | ORAL_TABLET | Freq: Every day | ORAL | Status: DC
  Filled 2020-09-05 (×2): qty 1

## 2020-09-05 MED ORDER — SENNOSIDES-DOCUSATE SODIUM 8.6-50 MG PO TABS
2.0000 | ORAL_TABLET | Freq: Every day | ORAL | Status: DC
  Administered 2020-09-06 – 2020-09-07 (×2): 2 via ORAL
  Filled 2020-09-05 (×2): qty 2

## 2020-09-05 MED ORDER — PROPOFOL 10 MG/ML IV BOLUS
INTRAVENOUS | Status: AC
  Filled 2020-09-05: qty 40

## 2020-09-05 MED ORDER — IBUPROFEN 600 MG PO TABS
600.0000 mg | ORAL_TABLET | Freq: Four times a day (QID) | ORAL | Status: DC
  Administered 2020-09-06: 600 mg via ORAL
  Filled 2020-09-05: qty 1

## 2020-09-05 MED ORDER — FENTANYL CITRATE (PF) 100 MCG/2ML IJ SOLN: 25.0000 ug | INTRAMUSCULAR | Status: DC | PRN

## 2020-09-05 MED ORDER — IBUPROFEN 600 MG PO TABS: 600.0000 mg | ORAL_TABLET | Freq: Four times a day (QID) | ORAL | Status: DC

## 2020-09-05 MED ORDER — LACTATED RINGERS IV SOLN: INTRAVENOUS | Status: DC

## 2020-09-05 MED ORDER — ONDANSETRON HCL 4 MG/2ML IJ SOLN: 4.0000 mg | Freq: Three times a day (TID) | INTRAMUSCULAR | Status: DC | PRN

## 2020-09-05 MED ORDER — HYDROMORPHONE HCL 1 MG/ML IJ SOLN: 1.0000 mg | INTRAMUSCULAR | Status: DC | PRN

## 2020-09-05 MED ORDER — OXYCODONE HCL 5 MG PO TABS
5.0000 mg | ORAL_TABLET | ORAL | Status: DC | PRN
  Administered 2020-09-05 – 2020-09-07 (×4): 5 mg via ORAL
  Filled 2020-09-05 (×4): qty 1

## 2020-09-05 MED ORDER — BUPIVACAINE LIPOSOME 1.3 % IJ SUSP: Freq: Once | INTRAMUSCULAR | Status: DC

## 2020-09-05 MED ORDER — PROPOFOL 10 MG/ML IV BOLUS
INTRAVENOUS | Status: AC
  Filled 2020-09-05: qty 20

## 2020-09-05 MED ORDER — CEFAZOLIN SODIUM-DEXTROSE 2-4 GM/100ML-% IV SOLN
2.0000 g | INTRAVENOUS | Status: AC
  Administered 2020-09-05: 2 g via INTRAVENOUS
  Filled 2020-09-05: qty 100

## 2020-09-05 MED ORDER — SODIUM CHLORIDE 0.9% FLUSH: 3.0000 mL | INTRAVENOUS | Status: DC | PRN

## 2020-09-05 MED ORDER — MAGNESIUM HYDROXIDE 400 MG/5ML PO SUSP: 30.0000 mL | ORAL | Status: DC | PRN

## 2020-09-05 MED ORDER — NALBUPHINE HCL 10 MG/ML IJ SOLN: 5.0000 mg | INTRAMUSCULAR | Status: DC | PRN

## 2020-09-05 MED ORDER — FERROUS SULFATE 325 (65 FE) MG PO TABS
325.0000 mg | ORAL_TABLET | Freq: Two times a day (BID) | ORAL | Status: DC
  Administered 2020-09-05 – 2020-09-07 (×4): 325 mg via ORAL
  Filled 2020-09-05 (×4): qty 1

## 2020-09-05 MED ORDER — SODIUM CHLORIDE 0.9 % IV SOLN
INTRAVENOUS | Status: DC | PRN
  Administered 2020-09-05: 500 mg via INTRAVENOUS

## 2020-09-05 MED ORDER — ACETAMINOPHEN 500 MG PO TABS
ORAL_TABLET | ORAL | Status: AC
  Filled 2020-09-05: qty 2

## 2020-09-05 MED ORDER — BUPIVACAINE HCL (PF) 0.5 % IJ SOLN
INTRAMUSCULAR | Status: AC
  Filled 2020-09-05: qty 30

## 2020-09-05 MED ORDER — LACTATED RINGERS AMNIOINFUSION
INTRAVENOUS | Status: DC
  Filled 2020-09-05 (×5): qty 1000

## 2020-09-05 MED ORDER — ONDANSETRON HCL 4 MG/2ML IJ SOLN: 4.0000 mg | Freq: Once | INTRAMUSCULAR | Status: DC | PRN

## 2020-09-05 MED ORDER — KETOROLAC TROMETHAMINE 30 MG/ML IJ SOLN
30.0000 mg | Freq: Four times a day (QID) | INTRAMUSCULAR | Status: AC
  Administered 2020-09-06 (×3): 30 mg via INTRAVENOUS
  Filled 2020-09-05 (×3): qty 1

## 2020-09-05 MED ORDER — DIPHENHYDRAMINE HCL 25 MG PO CAPS: 25.0000 mg | ORAL_CAPSULE | ORAL | Status: DC | PRN

## 2020-09-05 MED ORDER — ZOLPIDEM TARTRATE 5 MG PO TABS: 5.0000 mg | ORAL_TABLET | Freq: Every evening | ORAL | Status: DC | PRN

## 2020-09-05 MED ORDER — NALOXONE HCL 0.4 MG/ML IJ SOLN: 0.4000 mg | INTRAMUSCULAR | Status: DC | PRN

## 2020-09-05 MED ORDER — NALOXONE HCL 4 MG/10ML IJ SOLN
1.0000 ug/kg/h | INTRAVENOUS | Status: DC | PRN
  Filled 2020-09-05: qty 5

## 2020-09-05 MED ORDER — KETOROLAC TROMETHAMINE 30 MG/ML IJ SOLN: 30.0000 mg | Freq: Four times a day (QID) | INTRAMUSCULAR | Status: DC | PRN

## 2020-09-05 MED ORDER — KETOROLAC TROMETHAMINE 30 MG/ML IJ SOLN
30.0000 mg | Freq: Once | INTRAMUSCULAR | Status: AC
  Administered 2020-09-05: 30 mg via INTRAVENOUS
  Filled 2020-09-05: qty 1

## 2020-09-05 MED ORDER — DIPHENHYDRAMINE HCL 25 MG PO CAPS: 25.0000 mg | ORAL_CAPSULE | Freq: Four times a day (QID) | ORAL | Status: DC | PRN

## 2020-09-05 MED ORDER — BUPIVACAINE IN DEXTROSE 0.75-8.25 % IT SOLN
INTRATHECAL | Status: DC | PRN
  Administered 2020-09-05 (×2): 1.6 mL via INTRATHECAL

## 2020-09-05 MED ORDER — OXYTOCIN-SODIUM CHLORIDE 30-0.9 UT/500ML-% IV SOLN
INTRAVENOUS | Status: DC | PRN
  Administered 2020-09-05: 30 mL/h via INTRAVENOUS

## 2020-09-05 MED ORDER — DEXAMETHASONE SODIUM PHOSPHATE 10 MG/ML IJ SOLN
INTRAMUSCULAR | Status: DC | PRN
  Administered 2020-09-05: 10 mg via INTRAVENOUS

## 2020-09-05 MED ORDER — SODIUM CHLORIDE (PF) 0.9 % IJ SOLN
INTRAMUSCULAR | Status: AC
  Filled 2020-09-05: qty 50

## 2020-09-05 MED ORDER — KETOROLAC TROMETHAMINE 30 MG/ML IJ SOLN
30.0000 mg | Freq: Four times a day (QID) | INTRAMUSCULAR | Status: DC
  Administered 2020-09-05: 30 mg via INTRAVENOUS
  Filled 2020-09-05: qty 1

## 2020-09-05 MED ORDER — PROPOFOL 500 MG/50ML IV EMUL
INTRAVENOUS | Status: AC
  Filled 2020-09-05: qty 50

## 2020-09-05 MED ORDER — ACETAMINOPHEN 500 MG PO TABS
1000.0000 mg | ORAL_TABLET | Freq: Four times a day (QID) | ORAL | Status: DC
  Administered 2020-09-06 – 2020-09-07 (×5): 1000 mg via ORAL
  Filled 2020-09-05 (×6): qty 2

## 2020-09-05 MED ORDER — SODIUM CHLORIDE 0.9 % IV SOLN
500.0000 mg | INTRAVENOUS | Status: DC
  Filled 2020-09-05: qty 500

## 2020-09-05 MED ORDER — MEPERIDINE HCL 25 MG/ML IJ SOLN: 6.2500 mg | INTRAMUSCULAR | Status: DC | PRN

## 2020-09-05 MED ORDER — BUPIVACAINE HCL 0.5 % IJ SOLN
INTRAMUSCULAR | Status: DC | PRN
  Administered 2020-09-05: 30 mL

## 2020-09-05 MED ORDER — SIMETHICONE 80 MG PO CHEW: 80.0000 mg | CHEWABLE_TABLET | ORAL | Status: DC | PRN

## 2020-09-05 MED ORDER — SODIUM CHLORIDE 0.9% FLUSH
INTRAVENOUS | Status: DC | PRN
Start: 2020-09-05 — End: 2020-09-05
  Administered 2020-09-05: 50 mL

## 2020-09-05 MED ORDER — DIBUCAINE (PERIANAL) 1 % EX OINT: 1.0000 "application " | TOPICAL_OINTMENT | CUTANEOUS | Status: DC | PRN

## 2020-09-05 MED ORDER — MENTHOL 3 MG MT LOZG
1.0000 | LOZENGE | OROMUCOSAL | Status: DC | PRN
  Filled 2020-09-05: qty 9

## 2020-09-05 MED ORDER — SOD CITRATE-CITRIC ACID 500-334 MG/5ML PO SOLN
30.0000 mL | ORAL | Status: AC
  Administered 2020-09-05: 30 mL via ORAL
  Filled 2020-09-05: qty 15

## 2020-09-05 MED ORDER — KETOROLAC TROMETHAMINE 30 MG/ML IJ SOLN
30.0000 mg | Freq: Four times a day (QID) | INTRAMUSCULAR | Status: DC | PRN
  Administered 2020-09-05: 30 mg via INTRAVENOUS
  Filled 2020-09-05: qty 1

## 2020-09-05 MED ORDER — OXYTOCIN-SODIUM CHLORIDE 30-0.9 UT/500ML-% IV SOLN
2.5000 [IU]/h | INTRAVENOUS | Status: DC
  Administered 2020-09-05: 2.5 [IU]/h via INTRAVENOUS

## 2020-09-05 MED ORDER — OXYCODONE-ACETAMINOPHEN 5-325 MG PO TABS: 2.0000 | ORAL_TABLET | ORAL | Status: DC | PRN

## 2020-09-05 MED ORDER — MORPHINE SULFATE (PF) 0.5 MG/ML IJ SOLN
INTRAMUSCULAR | Status: AC
  Filled 2020-09-05: qty 10

## 2020-09-05 MED ORDER — BUPIVACAINE LIPOSOME 1.3 % IJ SUSP
INTRAMUSCULAR | Status: AC
  Filled 2020-09-05: qty 20

## 2020-09-05 SURGICAL SUPPLY — 33 items
APL PRP STRL LF DISP 70% ISPRP (MISCELLANEOUS) ×2
BAG COUNTER SPONGE EZ (MISCELLANEOUS) ×2 IMPLANT
BAG SPNG 4X4 CLR HAZ (MISCELLANEOUS) ×1
CELL SAVER LIPIGURD (MISCELLANEOUS) ×1 IMPLANT
CHLORAPREP W/TINT 26 (MISCELLANEOUS) ×4 IMPLANT
COVER WAND RF STERILE (DRAPES) ×2 IMPLANT
DRSG TELFA 3X8 NADH (GAUZE/BANDAGES/DRESSINGS) ×2 IMPLANT
ELECT REM PT RETURN 9FT ADLT (ELECTROSURGICAL) ×2
ELECTRODE REM PT RTRN 9FT ADLT (ELECTROSURGICAL) ×1 IMPLANT
EXTRT SYSTEM ALEXIS 14CM (MISCELLANEOUS) ×2
EXTRT SYSTEM ALEXIS 17CM (MISCELLANEOUS)
GAUZE SPONGE 4X4 12PLY STRL (GAUZE/BANDAGES/DRESSINGS) ×2 IMPLANT
GLOVE SURG ENC MOIS LTX SZ6.5 (GLOVE) ×2 IMPLANT
GLOVE SURG UNDER LTX SZ7 (GLOVE) ×2 IMPLANT
GOWN STRL REUS W/ TWL LRG LVL3 (GOWN DISPOSABLE) ×2 IMPLANT
GOWN STRL REUS W/TWL LRG LVL3 (GOWN DISPOSABLE) ×4
KIT TURNOVER KIT A (KITS) ×2 IMPLANT
MANIFOLD NEPTUNE II (INSTRUMENTS) ×2 IMPLANT
MAT PREVALON FULL STRYKER (MISCELLANEOUS) ×2 IMPLANT
NS IRRIG 1000ML POUR BTL (IV SOLUTION) ×2 IMPLANT
PACK C SECTION AR (MISCELLANEOUS) ×2 IMPLANT
PAD ABD DERMACEA PRESS 5X9 (GAUZE/BANDAGES/DRESSINGS) ×2 IMPLANT
PAD OB MATERNITY 4.3X12.25 (PERSONAL CARE ITEMS) ×2 IMPLANT
PAD PREP 24X41 OB/GYN DISP (PERSONAL CARE ITEMS) ×2 IMPLANT
PENCIL SMOKE EVACUATOR (MISCELLANEOUS) ×2 IMPLANT
SUT MNCRL AB 4-0 PS2 18 (SUTURE) ×2 IMPLANT
SUT PLAIN 2 0 XLH (SUTURE) IMPLANT
SUT VIC AB 0 CT1 36 (SUTURE) ×8 IMPLANT
SUT VIC AB 3-0 SH 27 (SUTURE) ×2
SUT VIC AB 3-0 SH 27X BRD (SUTURE) ×1 IMPLANT
SYSTEM CONTND EXTRCTN KII BLLN (MISCELLANEOUS) IMPLANT
TAPE MEDIFIX FOAM 3 (GAUZE/BANDAGES/DRESSINGS) ×2 IMPLANT
TAPE STRIPS DRAPE STRL (GAUZE/BANDAGES/DRESSINGS) ×2 IMPLANT

## 2020-09-05 NOTE — Transfer of Care (Signed)
Immediate Anesthesia Transfer of Care Note  Patient: Brenda Mccoy  Procedure(s) Performed: CESAREAN SECTION  Patient Location: Mother/Baby  Anesthesia Type:Spinal  Level of Consciousness: awake, alert , oriented and patient cooperative  Airway & Oxygen Therapy: Patient Spontanous Breathing  Post-op Assessment: Report given to RN and Post -op Vital signs reviewed and stable  Post vital signs: Reviewed and stable  Last Vitals:  Vitals Value Taken Time  BP    Temp    Pulse    Resp    SpO2      Last Pain:  Vitals:   09/05/20 0451  TempSrc: Oral  PainSc:       Patients Stated Pain Goal: 0 (09/04/20 2152)  Complications: No complications documented.

## 2020-09-05 NOTE — Progress Notes (Signed)
Patient ID: Brenda Mccoy, female   DOB: 03-10-1996, 25 y.o.   MRN: [id number]  Brenda Mccoy is a 25 y.o. G1P0 at [redacted]w[redacted]d by ultrasound admitted for full term premature rupture of membranes  Subjective:  Patient resting quietly in bed with eyes closed. FOB resting at bedside.   Denies difficulty breathing or respiratory distress, chest pain, abdominal pain, and leg pain.   Objective:  Temp:  [98 F (36.7 C)-99.9 F (37.7 C)] 99.6 F (37.6 C) (05/15 0451) Pulse Rate:  [61-95] 69 (05/15 0408) Resp:  [16-18] 16 (05/14 1930) BP: (97-138)/(47-76) 115/76 (05/15 0408) SpO2:  [97 %-100 %] 98 % (05/15 0450) Weight:  [105.2 kg] 105.2 kg (05/14 0913)  Fetal Wellbeing:  Category II  UC:   irregular, every two (2) to four (4) minutes; soft resting tone  SVE:   Dilation: 5 Effacement (%): 70 Station: -2 Exam by:: Brenda Mccoy CNM  Labs: Lab Results  Component Value Date   WBC 6.7 09/04/2020   HGB 11.8 (L) 09/04/2020   HCT 36.0 09/04/2020   MCV 86.1 09/04/2020   PLT 213 09/04/2020    Assessment:  Brenda Edwardsis a 25 y.o.G1P0 at [redacted]w[redacted]d admitted for full term premature rupture of membranes, Rh positive, GBS negative, COVID positive  FHR Category II  Plan:  The risks of surgery were discussed with the patient including but were not limited to: bleeding which may require transfusion or reoperation; infection which may require antibiotics; injury to bowel, bladder, ureters or other surrounding organs; injury to the fetus; need for additional procedures including hysterectomy in the event of a life-threatening hemorrhage; formation of adhesions; placental abnormalities with subsequent pregnancies; incisional problems; thromboembolic phenomenon and other postoperative/anesthesia complications. The patient concurred with the proposed plan, giving informed written consent for the procedure.    Brenda Mccoy, CNM [facility], [facility] 09/05/2020, 5:29 AM

## 2020-09-05 NOTE — Progress Notes (Signed)
OB Physician Progress Note  Contacted by [name], CNM for patient management due Category II tracing. Brenda Mccoy is a 25 y.o. G1P0 at [redacted]w[redacted]d who presented yesterday morning with PROM at 1 cm. Pregnancy complicated by morbid obesity.  Reviewed tracing, fetal tachycardia (170-180s).  Fetal tracing also noting variable and late decelerations. Has amnioinfusion running. The risks of surgery were discussed with the patient including but were not limited to: bleeding which may require transfusion or reoperation; infection which may require antibiotics; injury to bowel, bladder, ureters or other surrounding organs; injury to the fetus; need for additional procedures including hysterectomy in the event of a life-threatening hemorrhage; formation of adhesions; placental abnormalities with subsequent pregnancies; incisional problems; thromboembolic phenomenon and other postoperative/anesthesia complications.  The patient concurred with the proposed plan, giving informed written consent for the procedure.  Anesthesia and OR aware. Preoperative prophylactic antibiotics and SCDs ordered on call to the OR.  To OR when ready.  Hildred Laser, MD [facility]

## 2020-09-05 NOTE — Lactation Note (Signed)
This note was copied from a baby's chart. Lactation Consultation Note  Patient Name: Brenda Mccoy [id number] Date: 09/05/2020 Reason for consult: Follow-up assessment;Mother's request;Primapara;Term;Other (Comment) (Mom is Covid (+)) Age:23 hours  Maternal Data Has patient been taught Hand Expression?: Yes Does the patient have breastfeeding experience prior to this delivery?: No (First baby)  Feeding Mother's Current Feeding Choice: Breast Milk  Assisted mom with latching baby in football hold with pillow support for comfort.  Mom concerned that he is not getting enough milk because he seems to want to breast feed all the time.  Demonstrated hand expression of lots of colostrum.  Explained that the fact that he was awake and wanting to breast feed frequently is a good thing because that will help to bring the mature milk in more quickly, ensure a plentiful milk supply and prevent her from getting engorged because a lot of babies in the beginning were sleepy and not wanting to breast feed.  Pointed out feeding cues encouraging her to put him to the breast whenever he demonstrated hunger cues.  This is mom's first baby.  She is Covid (+) and all precautions have been taken when entered mom's room.  Mom has large breasts and well everted nipples.  He latches well with strong, rhythmic sucking with occasional swallows.  Demonstrated how to massage breasts and gently stimulate him by touching his chin to keep him actively sucking at the breast.  Hand out given on what to expect with breast feeding the first 4 days of life explaining normal newborn stomach size, normal course of lactation, supply and demand, adequate intake and out put, feeding cues and routine newborn feeding patterns.  Lactation [employer] given with virtual support groups, web sites and contact numbers listed.  Mom was on the phone and FOB was dozing while lactation education was being taught so reinforcement might be  needed later.  Lactation name and number was written on white board and encouraged mom to call with any questions, concerns or assistance.  Mom does not have any further questions at this time and verbalizes understanding of instructions to call lactation when needed. LATCH Score Latch: Grasps breast easily, tongue down, lips flanged, rhythmical sucking.  Audible Swallowing: A few with stimulation  Type of Nipple: Everted at rest and after stimulation  Comfort (Breast/Nipple): Soft / non-tender  Hold (Positioning): Assistance needed to correctly position infant at breast and maintain latch.  LATCH Score: 8   Lactation Tools Discussed/Used    Interventions Interventions: Breast feeding basics reviewed;Assisted with latch;Skin to skin;Breast massage;Hand express;Breast compression;Adjust position;Support pillows;Position options;Education  Discharge Discharge Education: Other (comment) WIC Program: No  Consult Status Consult Status: PRN Follow-up type: Call as needed    Louis Meckel 09/05/2020, 8:28 PM

## 2020-09-05 NOTE — Progress Notes (Signed)
Patient ID: Brenda Mccoy, female   DOB: January 13, 1996, 25 y.o.   MRN: [id number]  Brenda Mccoy is a 25 y.o. G1P0 at [redacted]w[redacted]d by ultrasound admitted for rupture of membranes  Subjective:  Patient resting quietly in bed, reports pain relief since epidural placement.   FOB at bedside for continuous labor support.   Denies difficulty breathing or respiratory distress, chest pain, abdominal pain, and leg pain.   Objective:  Temp:  [98 F (36.7 C)-98.7 F (37.1 C)] 98 F (36.7 C) (05/14 2330) Pulse Rate:  [61-95] 61 (05/14 2309) Resp:  [16-18] 16 (05/14 1930) BP: (97-138)/(47-75) 99/56 (05/14 2309) SpO2:  [98 %-100 %] 99 % (05/14 2340) Weight:  [105.2 kg] 105.2 kg (05/14 0913)  Fetal Wellbeing:  Category II  UC:   regular, every three (3) to four (4) minutes; pitocin infusing at 4 mu/min  SVE:   Dilation: 4.5 Effacement (%): 70 Station: -2 Exam by:: Ardine Eng RN  Labs: Lab Results  Component Value Date   WBC 6.7 09/04/2020   HGB 11.8 (L) 09/04/2020   HCT 36.0 09/04/2020   MCV 86.1 09/04/2020   PLT 213 09/04/2020    Assessment:  Brenda Edwardsis a 24 y.o.G1P0 at [redacted]w[redacted]d admitted for full term premature rupture of membranes, Rh positive, GBS negative, COVID positive  FHR Category II   Plan:  Patient repositioned from side lying to seated position with peanut ball under right leg.   Reviewed red flag symptoms and when to call.   Will continue to monitor.   Dr. Valentino Saxon present in unit and EFM tracing reviewed.    [name], CNM [facility], [facility] 09/05/2020, 12:07 AM

## 2020-09-05 NOTE — Progress Notes (Signed)
Amnioinfusion initiated per CNM & MD order

## 2020-09-05 NOTE — Anesthesia Procedure Notes (Signed)
Spinal  Patient location during procedure: OR Reason for block: surgical anesthesia Staffing Performed: anesthesiologist  Anesthesiologist: Piscitello, Precious Haws, MD Preanesthetic Checklist Completed: patient identified, IV checked, site marked, risks and benefits discussed, surgical consent, monitors and equipment checked, pre-op evaluation and timeout performed Spinal Block Patient position: sitting Prep: Betadine Patient monitoring: heart rate, continuous pulse ox, blood pressure and cardiac monitor Approach: midline Location: L4-5 Injection technique: single-shot Needle Needle type: Whitacre and Introducer  Needle gauge: 24 G Needle length: 9 cm Assessment Sensory level: T10 Events: CSF return Additional Notes Spotty epidural coverage over the last 2 hours as reported. Decicison to proceed with sab and accepted by the patient. CTOI. Single pass and easy. Negative paresthesia. Negative blood return. Positive free-flowing CSF. Expiration date of kit checked and confirmed. Patient tolerated procedure well, without complications.

## 2020-09-05 NOTE — Progress Notes (Signed)
Patient ID: Brenda Mccoy, female   DOB: 10-26-1995, 25 y.o.   MRN: [id number]  Brenda Mccoy is a 26 y.o. G1P0 at [redacted]w[redacted]d by ultrasound admitted for rupture of membranes  Subjective:  Patient reports left lower abdominal pressure with contractions. FOB asleep at bedside.   Denies difficulty breathing or respiratory distress, chest pain, and leg pain.   Objective:  Temp:  [98 F (36.7 C)-99 F (37.2 C)] 99 F (37.2 C) (05/15 0325) Pulse Rate:  [61-95] 69 (05/15 0408) Resp:  [16-18] 16 (05/14 1930) BP: (97-138)/(47-76) 115/76 (05/15 0408) SpO2:  [97 %-100 %] 99 % (05/15 0405) Weight:  [105.2 kg] 105.2 kg (05/14 0913)  Fetal Wellbeing:  Category II  UC:   irregular, every two (2) to four (4) minutes with periods of coupling; soft resting tone  SVE:   Dilation: 5 Effacement (%): 70 Station: -2 Exam by:: Ardine Eng RN  Labs: Lab Results  Component Value Date   WBC 6.7 09/04/2020   HGB 11.8 (L) 09/04/2020   HCT 36.0 09/04/2020   MCV 86.1 09/04/2020   PLT 213 09/04/2020    Assessment:  Brenda Edwardsis a 24 y.o.G1P0 at [redacted]w[redacted]d admitted for full term premature rupture of membranes, Rh positive, GBS negative, COVID positive  FHR Category II  Plan:  MD contacted regarding persistent Category II FHR tracing, recommendations for IUPC and amnioinfusion given.   IUPC placed without difficulty and amnioinfusion started, see orders.   Discussed actual verses expected progress with patient and inability to titrate pitocin due to fetal heart rate decelerations, patient understands cesarean section may be needed.   Patient placed of left side in flying cowgirl position with peanut ball.   Reviewed red flag symptoms and when to call.   Continue orders as written. Reassess as needed.    Brenda Mccoy, CNM [facility], [facility] 09/05/2020, 4:18 AM

## 2020-09-05 NOTE — Op Note (Signed)
Cesarean Section Procedure Note  Indications: non-reassuring fetal status  Pre-operative Diagnosis: 39 week 6 day pregnancy, non-reassuring fetal tracing, PROM, obesity in pregnancy (BMI 39).  Post-operative Diagnosis: Same  Surgeon: Hildred Laser, MD  Assistants:  [name], CNM.  An experienced assistant was required given the standard of surgical care given the complexity of the case.  This assistant was needed for exposure, dissection, suctioning, retraction, instrument exchange, and for overall help during the procedure.  Procedure: Repeat:  Primary low transverse Cesarean Section  Anesthesia: Spinal anesthesia  Procedure Details: The patient was seen in the Holding Room. The risks, benefits, complications, treatment options, and expected outcomes were discussed with the patient.  The patient concurred with the proposed plan, giving informed consent.  The site of surgery properly noted/marked. The patient was taken to the Operating Room, identified as Brenda Mccoy and the procedure verified as C-Section Delivery. A Time Out was held and the above information confirmed.  After induction of anesthesia, the patient was draped and prepped in the usual sterile manner. Anesthesia was tested and noted to be adequate. A Pfannenstiel incision was made and carried down through the subcutaneous tissue to the fascia. Fascial incision was made and extended transversely. The fascia was separated from the underlying rectus tissue superiorly and inferiorly. The peritoneum was identified and entered. Peritoneal incision was extended longitudinally. The surgical assist was able to provide retraction to allow for clear visualization of surgical site. The utero-vesical peritoneal reflection was incised transversely and the bladder flap was bluntly freed from the lower uterine segment. A low transverse uterine incision was made. Delivered from cephalic (OP, asynclitic) presentation was a 3010 gram Female  with Apgar scores of 7 at one minute and 9 at five minutes.  The assistant was able to apply adequate fundal pressure to allow for successful delivery of the fetus. The umbilical cord was clamped and cut. No cord blood was obtained for evaluation. The placenta was removed intact and had moderate calcifications noted. The uterus was exteriorized and cleared of all clots and debris. The uterine outline, tubes and ovaries appeared normal.  The uterine incision was closed with running locked sutures of 0-Vicryl.  A second suture of 0-Vicryl was used in an imbricating layer.  Hemostasis was observed. Lavage was carried out until clear. The fascia was then reapproximated with a running suture of 0-Vicryl. The fascia was then injected with 60 ml of 1.3% Exparel solution. The subcutaneous fat layer was reapproximated with 3-0 Vicryl. The skin was reapproximated with 4-0 Monocryl.  The skin is injected with an additional 30 ml of 1.3% Exparel solution.   Instrument, sponge, and needle counts were correct prior the abdominal closure and at the conclusion of the case.   Findings: Female infant, cephalic presentation, asynclytic OCP presentation, 3010 grams, with Apgar scores of 7 at one minute and 9 at five minutes. Intact placenta with 3 vessel cord. Moderate calcifications throughout the placental surface. Thin meconium stained fluid at amniotomy The uterine outline, tubes and ovaries appeared normal.   Estimated Blood Loss: 1080 ml      Drains: foley catheter to gravity drainage, 100 ml of clear urine at end of the procedure         Total IV Fluids: 1000 ml  Specimens: Placenta and Disposition:  Sent to Pathology         Implants: None         Complications:  None; patient tolerated the procedure well.  Disposition: PACU - hemodynamically stable.         Condition: stable   Hildred Laser, MD [facility]

## 2020-09-06 ENCOUNTER — Other Ambulatory Visit: Payer: BC Managed Care – PPO

## 2020-09-06 ENCOUNTER — Encounter: Payer: BC Managed Care – PPO | Admitting: Certified Nurse Midwife

## 2020-09-06 ENCOUNTER — Encounter: Payer: Self-pay | Admitting: Obstetrics and Gynecology

## 2020-09-06 DIAGNOSIS — O09299 Supervision of pregnancy with other poor reproductive or obstetric history, unspecified trimester: Secondary | ICD-10-CM

## 2020-09-06 HISTORY — DX: Supervision of pregnancy with other poor reproductive or obstetric history, unspecified trimester: O09.299

## 2020-09-06 LAB — CBC
HCT: 27.2 % — ABNORMAL LOW (ref 36.0–46.0)
Hemoglobin: 9.2 g/dL — ABNORMAL LOW (ref 12.0–15.0)
MCH: 29.1 pg (ref 26.0–34.0)
MCHC: 33.8 g/dL (ref 30.0–36.0)
MCV: 86.1 fL (ref 80.0–100.0)
Platelets: 172 10*3/uL (ref 150–400)
RBC: 3.16 MIL/uL — ABNORMAL LOW (ref 3.87–5.11)
RDW: 13.7 % (ref 11.5–15.5)
WBC: 21.1 10*3/uL — ABNORMAL HIGH (ref 4.0–10.5)
nRBC: 0 % (ref 0.0–0.2)

## 2020-09-06 MED ORDER — IBUPROFEN 600 MG PO TABS
600.0000 mg | ORAL_TABLET | Freq: Four times a day (QID) | ORAL | Status: DC
  Administered 2020-09-07 (×2): 600 mg via ORAL
  Filled 2020-09-06 (×2): qty 1

## 2020-09-06 NOTE — Anesthesia Postprocedure Evaluation (Signed)
Anesthesia Post Note  Patient: Brenda Mccoy  Procedure(s) Performed: CESAREAN SECTION  Patient location during evaluation: Women's Unit Anesthesia Type: Spinal Level of consciousness: awake, awake and alert and oriented Pain management: pain level controlled Vital Signs Assessment: post-procedure vital signs reviewed and stable Respiratory status: spontaneous breathing Cardiovascular status: stable Postop Assessment: no headache, no backache, no apparent nausea or vomiting, adequate PO intake, able to ambulate and patient able to bend at knees Anesthetic complications: no   No complications documented.   Last Vitals:  Vitals:   09/06/20 0100 09/06/20 0300  BP:    Pulse: 74 70  Resp:    Temp:    SpO2: 94% 93%    Last Pain:  Vitals:   09/05/20 2308  TempSrc: Oral  PainSc:                  Lyn Records

## 2020-09-06 NOTE — Lactation Note (Signed)
This note was copied from a baby's chart. Lactation Consultation Note  Patient Name: Boy Dietrich Ke [id number] Date: 09/06/2020 Reason for consult: Follow-up assessment;Mother's request;Primapara;Term;Other (Comment) (Mom Covid 19 (+)) Age:25  Maternal Data Has patient been taught Hand Expression?: Yes Does the patient have breastfeeding experience prior to this delivery?: No  Feeding Mother's Current Feeding Choice: Breast Milk  Aggie Hacker was circumcised this am and had been asleep since.  Mom breast fed him just before he went for his circumcision for almost an hour.  When went in mom's room, Aggie Hacker was passing gas.  When checked his pamper, he had a meconium stool.  He was aroused and sucking on his fist after the pamper change.  Mom was willing to put him to the breast.  He latched with minimal assistance and began strong, rhythmic sucking with occasional swallow for 20 minutes.  Mom prefers the football hold.  He came off the first breast on his own appearing satiated.  As soon as mom sat him up to burp, he started rooting again.  We latched him to the left breast where he breast fed for another 3 minutes taking a few more productive sucks.  Mom voiced having tender nipples on initial latch which eased up shortly into breast feed.  Mom was not holding him close to the breast at first with his nose and chin touching and he was getting a shallow latch.  Demonstrated how to sandwich breast for deeper latch.  Other breast feeding positions were discussed which could help with putting friction on a different area of the nipple and ultimately the tenderness.  Colostrum expressed after the breast feed and rubbed on nipples to prevent bacteria, lubricate and help with tenderness.  Coconut oil and comfort gels given and instructed in alternating use.  Mom really liked the comfort gels, asking for another set to take home with her which were given.  Explained that one set was good for 5 to 7 days of  use.  All the lactation teaching done yesterday was reinforced and mom had questions about when and how to begin pumping to store up some milk to return to work in June which were addressed.  Mom has a DEBP at her mother's that she had ordered through her [employer].  Lactation name and number is still written on white board.  Encouraged to call with any questions, concerns or assistance. LATCH Score Latch: Grasps breast easily, tongue down, lips flanged, rhythmical sucking.  Audible Swallowing: A few with stimulation  Type of Nipple: Everted at rest and after stimulation  Comfort (Breast/Nipple): Filling, red/small blisters or bruises, mild/mod discomfort  Hold (Positioning): Assistance needed to correctly position infant at breast and maintain latch.  LATCH Score: 7   Lactation Tools Discussed/Used Tools: Coconut oil;Comfort gels  Interventions Interventions: Breast feeding basics reviewed;Assisted with latch;Breast massage;Hand express;Breast compression;Adjust position;Support pillows;Position options;Coconut oil;Comfort gels;Education  Discharge Discharge Education: Engorgement and breast care;Warning signs for feeding baby;Other (comment) Pump: Personal (Mom already has DEBP through her [employer] at her mother's house) [facility] Program: No [employer])  Consult Status Consult Status: PRN Follow-up type: Call as needed    Louis Meckel 09/06/2020, 2:28 PM

## 2020-09-06 NOTE — Progress Notes (Signed)
Postpartum Day # 1: Cesarean Delivery (primary, for non-reassuring fetal tracing). COVID+.   Subjective: Patient reports tolerating PO, + flatus and no problems voiding.  Notes that her pain is manageable. Is breastfeeding, reports baby is latching well. Notes cough and congestion are improving.   Objective: Vital signs in last 24 hours: Temp:  [97.6 F (36.4 C)-98.7 F (37.1 C)] 97.6 F (36.4 C) (05/16 0809) Pulse Rate:  [56-82] 58 (05/16 0809) Resp:  [18-20] 18 (05/16 0809) BP: (113-134)/(66-86) 113/66 (05/16 0809) SpO2:  [93 %-99 %] 99 % (05/16 0809)  Physical Exam:  General: alert and no distress Lungs: clear to auscultation bilaterally, no wheezing or rhonci. Breasts: normal appearance, no masses or tenderness Heart: regular rate and rhythm, S1, S2 normal, no murmur, click, rub or gallop Abdomen: soft, non-tender; bowel sounds normal; no masses,  no organomegaly Pelvis: Lochia appropriate, Uterine Fundus firm, Incision: healing well, no significant drainage, no dehiscence, no significant erythema Extremities: DVT Evaluation: No evidence of DVT seen on physical exam. Negative Homan's sign. No cords or calf tenderness. No significant calf/ankle edema.  Recent Labs    09/04/20 1100 09/06/20 0314  HGB 11.8* 9.2*  HCT 36.0 27.2*    Assessment/Plan: Status post Cesarean section. Doing well postoperatively.  Breastfeeding, Lactation consult as needed Circumcision prior to discharge  Contraception initially undecided, however after counseling of options she is considering contraceptive patch. Regular diet as tolerated. Continue PO pain management Respiratory precautions for COVID +. Can use Robitussin for cough as needed. Notes symptoms are improving.  Mild anemia postpartum, can take PO iron supplementation.  Continue current care. Plan for discharge tomorrow.    Hildred Laser, MD [facility]

## 2020-09-07 LAB — SURGICAL PATHOLOGY

## 2020-09-07 MED ORDER — OXYCODONE HCL 5 MG PO TABS: 5.0000 mg | ORAL_TABLET | ORAL | 0 refills | Status: DC | PRN

## 2020-09-07 MED ORDER — IBUPROFEN 600 MG PO TABS: 600.0000 mg | ORAL_TABLET | Freq: Four times a day (QID) | ORAL | 0 refills | Status: DC

## 2020-09-07 MED ORDER — SENNOSIDES-DOCUSATE SODIUM 8.6-50 MG PO TABS: 2.0000 | ORAL_TABLET | Freq: Every day | ORAL | 0 refills | Status: DC

## 2020-09-07 MED ORDER — GABAPENTIN 600 MG PO TABS: 600.0000 mg | ORAL_TABLET | Freq: Every day | ORAL | 0 refills | Status: DC

## 2020-09-07 MED ORDER — SIMETHICONE 80 MG PO CHEW: 80.0000 mg | CHEWABLE_TABLET | ORAL | 0 refills | Status: DC | PRN

## 2020-09-07 MED ORDER — ACETAMINOPHEN 500 MG PO TABS: 1000.0000 mg | ORAL_TABLET | Freq: Four times a day (QID) | ORAL | 0 refills | Status: DC

## 2020-09-07 NOTE — Plan of Care (Signed)
Discharge home.

## 2020-09-07 NOTE — Discharge Summary (Signed)
Obstetric Discharge Summary  Patient ID: Brenda Mccoy MRN: [id number] DOB/AGE: 14-Jan-1996 25 y.o.   Date of Admission: 09/04/2020  Date of Discharge:  09/07/20  Admitting Diagnosis: Induction of labor at [redacted]w[redacted]d  Secondary Diagnosis:   Patient Active Problem List   Diagnosis Date Noted  . Postpartum hemorrhage 09/06/2020  . Cesarean delivery delivered   . Non-reassuring fetal heart rate with late deceleration   . Thin meconium stained amniotic fluid   . Labor and delivery, indication for care 09/04/2020  . Full-term premature rupture of membranes 09/04/2020  . COVID-19 09/03/2020  . Pregnancy 08/07/2020  . Anemia in pregnancy 06/24/2020  . Positive RPR test 06/24/2020  . Prediabetes 02/16/2020  . Type B blood, Rh positive 02/16/2020  . Obesity in pregnancy 02/16/2020    Mode of Delivery: primary cesarean section- low uterine, transverse     Discharge Diagnosis: Reasons for cesarean section  Non-reassuring FHR and Malpresentation occiput posterior, asynclitic   Intrapartum Procedures: epidural, pitocin augmentation and cytotec/foley bulb   Post partum procedures: None  Complications: hemorrhage   Brief Hospital Course    Brenda Mccoy is a G1P1001 who underwent cesarean section on 09/05/2020.  Patient had an uncomplicated surgery; for further details of this surgery, please refer to the operative note.  Patient had an uncomplicated postpartum course.  By time of discharge on POD#2/PPD#2, her pain was controlled on oral pain medications; she had appropriate lochia and was ambulating, voiding without difficulty, tolerating regular diet and passing flatus.   She was deemed stable for discharge to home.    Labs: CBC Latest Ref Rng & Units 09/06/2020 09/04/2020 06/18/2020  WBC 4.0 - 10.5 K/uL 21.1(H) 6.7 8.6  Hemoglobin 12.0 - 15.0 g/dL 0.7(E) 11.8(L) 10.4(L)  Hematocrit 36.0 - 46.0 % 27.2(L) 36.0 31.5(L)  Platelets 150 - 400 K/uL 172 213 226   B POS  Physical exam:    Temp:  [97.9 F (36.6 C)-98.3 F (36.8 C)] 97.9 F (36.6 C) (05/17 0743) Pulse Rate:  [56-81] 56 (05/17 0743) Resp:  [18-20] 19 (05/17 0743) BP: (112-127)/(68-78) 118/77 (05/17 0743) SpO2:  [97 %-99 %] 98 % (05/17 0743)  General: alert and no distress  Lochia: appropriate  Abdomen: soft, NT  Uterine Fundus: firm  Incision: covered by dressing-clean, dry, intact  Extremities: No evidence of DVT seen on physical exam. No lower extremity edema  Edinburgh Postnatal Depression Scale Screening Tool 09/06/2020  I have been able to laugh and see the funny side of things. 0  I have looked forward with enjoyment to things. 0  I have blamed myself unnecessarily when things went wrong. 1  I have been anxious or worried for no good reason. 0  I have felt scared or panicky for no good reason. 0  Things have been getting on top of me. 1  I have been so unhappy that I have had difficulty sleeping. 0  I have felt sad or miserable. 1  I have been so unhappy that I have been crying. 1  The thought of harming myself has occurred to me. 1  Edinburgh Postnatal Depression Scale Total 5     Discharge Instructions: Per After Visit Summary.  Activity: Advance as tolerated. Pelvic rest for 6 weeks.  Also refer to After Visit Summary  Diet: Regular  Medications: Allergies as of 09/07/2020      Reactions   Miconazole Itching      Medication List    STOP taking these medications   aspirin EC 81 MG  tablet     TAKE these medications   acetaminophen 500 MG tablet Commonly known as: TYLENOL Take 2 tablets (1,000 mg total) by mouth every 6 (six) hours.   FLINSTONES GUMMIES OMEGA-3 DHA PO Take by mouth.   Fusion Plus Caps Take by mouth.   gabapentin 600 MG tablet Commonly known as: NEURONTIN Take 1 tablet (600 mg total) by mouth daily.   ibuprofen 600 MG tablet Commonly known as: ADVIL Take 1 tablet (600 mg total) by mouth every 6 (six) hours.   oxyCODONE 5 MG immediate release  tablet Commonly known as: Oxy IR/ROXICODONE Take 1-2 tablets (5-10 mg total) by mouth every 4 (four) hours as needed for moderate pain or severe pain.   senna-docusate 8.6-50 MG tablet Commonly known as: Senokot-S Take 2 tablets by mouth daily.   simethicone 80 MG chewable tablet Commonly known as: MYLICON Chew 1 tablet (80 mg total) by mouth as needed for flatulence.            Discharge Care Instructions  (From admission, onward)         Start     Ordered   09/07/20 0000  Leave dressing on - Keep it clean, dry, and intact until clinic visit        09/07/20 0902         Outpatient follow up:   Follow-up Information    Gunnar Bulla, CNM Follow up.   Specialties: Certified Nurse Midwife, Obstetrics and Gynecology, Radiology Why: Please keep your follow up appointments as scheduled Contact information: [address] [place] [address] [phone]              Postpartum contraception: Uncertain  Discharged Condition: stable  Discharged to: home   Newborn Data:  Disposition:home with mother  Apgars: APGAR (1 MIN): 7   APGAR (5 MINS): 9    Baby Feeding: Breast   [name], CNM  [facility], [facility] 09/07/20 9:04 AM

## 2020-09-07 NOTE — Lactation Note (Signed)
This note was copied from a baby's chart. Lactation Consultation Note  Patient Name: Brenda Mccoy [id number] Date: 09/07/2020 Reason for consult: Follow-up assessment;Primapara;Term;Other (Comment) (c-section) Age:23 hours  Lactation follow-up prior to anticipated discharge.  Mom has been exclusively BF without any overall concerns. However, today mom notes more discomfort in latching on her R breast, tenderness in the nipple, and noticing baby not opening as wide. Mom reports this started happening when feeding resumed after circumcision yesterday. Mom has been given comfort gels and coconut oil. LC provided tips for getting baby to open wider: rubbing nose to chin, expressing colostrum onto tip of nipple, and catching early cues. Encouraged continued alternating use of coconut oil and comfort gels for the tenderness/soreness. LC cautioned against prolonged pacifier use and the impact that this can have on establishing breastfeeding: missing early cues, shallow latch, frustration. Mom requests hand pump for discharge- she does not have an electric pump at home, however would like to occasionally express milk.  Education given for anticipated breast changes, management of breast fullness and engorgement and nipple care. Reviewed what to expect with breastfeeding in the days to come, encouraged to continue to track output, and call with questions/concerns. Information given for outpatient lactation services and community breastfeeding support.  Maternal Data Has patient been taught Hand Expression?: Yes Does the patient have breastfeeding experience prior to this delivery?: No  Feeding Mother's Current Feeding Choice: Breast Milk  LATCH Score                    Lactation Tools Discussed/Used Tools: Comfort gels;Coconut oil  Interventions Interventions: Breast feeding basics reviewed;Hand express;Pre-pump if needed;Coconut oil;Comfort gels;Hand  pump;Education  Discharge Pump: Manual  Consult Status Consult Status: Complete Date: 09/07/20 Follow-up type: Call as needed    Danford Bad 09/07/2020, 11:56 AM

## 2020-09-09 ENCOUNTER — Telehealth: Payer: Self-pay | Admitting: Certified Nurse Midwife

## 2020-09-09 NOTE — Telephone Encounter (Signed)
Pt is 5 days s/p c/s.   Honey comb bandage is coming off.   No redness at incision. No pus or bleeding.  No tenderness or pain.   + for itching at incision site.   Pt aware the honey comb bandage can be removed. She should have steri strips underneath. They will fall off in about 10 days.   Pt states her ankles are swelling some. No h/a, blurred vision. NO HTN in preg. Advised to elevate and push fluids. Monitor for now.   Incision check scheduled for next week.

## 2020-09-09 NOTE — Telephone Encounter (Signed)
Brenda Mccoy called and stated she had a c-section.  Brenda Mccoy states her bandages are starting to come off.  Brenda Mccoy wants to know if she should just take the bandage off or leave it alone and let it fall off on her own?  Please advise.

## 2020-09-10 ENCOUNTER — Emergency Department
Admission: EM | Admit: 2020-09-10 | Discharge: 2020-09-10 | Disposition: A | Discharge status: Home / Self Care | Payer: BC Managed Care – PPO | Attending: Emergency Medicine | Admitting: Emergency Medicine

## 2020-09-10 ENCOUNTER — Emergency Department: Payer: BC Managed Care – PPO

## 2020-09-10 ENCOUNTER — Encounter: Payer: Self-pay | Admitting: Emergency Medicine

## 2020-09-10 ENCOUNTER — Other Ambulatory Visit: Payer: Self-pay

## 2020-09-10 DIAGNOSIS — R2243 Localized swelling, mass and lump, lower limb, bilateral: Secondary | ICD-10-CM | POA: Diagnosis not present

## 2020-09-10 DIAGNOSIS — Z8616 Personal history of COVID-19: Secondary | ICD-10-CM | POA: Diagnosis not present

## 2020-09-10 DIAGNOSIS — R0789 Other chest pain: Secondary | ICD-10-CM | POA: Diagnosis not present

## 2020-09-10 DIAGNOSIS — R079 Chest pain, unspecified: Secondary | ICD-10-CM | POA: Diagnosis present

## 2020-09-10 LAB — URINALYSIS, COMPLETE (UACMP) WITH MICROSCOPIC
Bacteria, UA: NONE SEEN
Bilirubin Urine: NEGATIVE
Glucose, UA: NEGATIVE mg/dL
Ketones, ur: NEGATIVE mg/dL
Nitrite: NEGATIVE
Protein, ur: NEGATIVE mg/dL
Specific Gravity, Urine: 1.009 (ref 1.005–1.030)
pH: 6 (ref 5.0–8.0)

## 2020-09-10 LAB — COMPREHENSIVE METABOLIC PANEL
ALT: 14 U/L (ref 0–44)
AST: 18 U/L (ref 15–41)
Albumin: 3 g/dL — ABNORMAL LOW (ref 3.5–5.0)
Alkaline Phosphatase: 124 U/L (ref 38–126)
Anion gap: 8 (ref 5–15)
BUN: 13 mg/dL (ref 6–20)
CO2: 25 mmol/L (ref 22–32)
Calcium: 8.8 mg/dL — ABNORMAL LOW (ref 8.9–10.3)
Chloride: 106 mmol/L (ref 98–111)
Creatinine, Ser: 0.78 mg/dL (ref 0.44–1.00)
GFR, Estimated: >60 mL/min (ref >60)
Glucose, Bld: 90 mg/dL (ref 70–99)
Potassium: 3.8 mmol/L (ref 3.5–5.1)
Sodium: 139 mmol/L (ref 135–145)
Total Bilirubin: 0.5 mg/dL (ref 0.3–1.2)
Total Protein: 6.4 g/dL — ABNORMAL LOW (ref 6.5–8.1)

## 2020-09-10 LAB — TROPONIN I (HIGH SENSITIVITY): Troponin I (High Sensitivity): 8 ng/L (ref <18)

## 2020-09-10 LAB — CBC
HCT: 29.9 % — ABNORMAL LOW (ref 36.0–46.0)
Hemoglobin: 9.8 g/dL — ABNORMAL LOW (ref 12.0–15.0)
MCH: 28.6 pg (ref 26.0–34.0)
MCHC: 32.8 g/dL (ref 30.0–36.0)
MCV: 87.2 fL (ref 80.0–100.0)
Platelets: 284 10*3/uL (ref 150–400)
RBC: 3.43 MIL/uL — ABNORMAL LOW (ref 3.87–5.11)
RDW: 14 % (ref 11.5–15.5)
WBC: 8.9 10*3/uL (ref 4.0–10.5)
nRBC: 0 % (ref 0.0–0.2)

## 2020-09-10 LAB — BRAIN NATRIURETIC PEPTIDE: B Natriuretic Peptide: 241.6 pg/mL — ABNORMAL HIGH (ref 0.0–100.0)

## 2020-09-10 NOTE — ED Triage Notes (Signed)
Pt to ED via POV with infant son who is also a patient. Pt reports CP that started yesterday and bilateral ankle swelling x 2 days. Pt is post partum x 5 days with a c-section.

## 2020-09-10 NOTE — ED Provider Notes (Signed)
[facility] Emergency Department Provider Note   ____________________________________________    I have reviewed the triage vital signs and the nursing notes.   HISTORY  Chief Complaint Chest Pain and Postpartum Complications     HPI Kylan Veach is a 25 y.o. female who presents with complaints of intermittent chest pain.  Patient reports she is 5 days postpartum.  She has felt a burning in her lower chest and epigastrium intermittently.,  No symptoms today.  No shortness of breath.  Has had some swelling in the lower legs as well which she feels may be new since delivery but she may have had in the third trimester as well.  No calf pain or swelling.  No fevers chills.  No abdominal pain.  No headache.  Currently feels well.  Past Medical History:  Diagnosis Date  . Anemia     Patient Active Problem List   Diagnosis Date Noted  . Postpartum hemorrhage 09/06/2020  . Cesarean delivery delivered   . Non-reassuring fetal heart rate with late deceleration   . Thin meconium stained amniotic fluid   . Labor and delivery, indication for care 09/04/2020  . Full-term premature rupture of membranes 09/04/2020  . COVID-19 09/03/2020  . False labor after 37 completed weeks of gestation   . Pregnancy 08/07/2020  . Anemia in pregnancy 06/24/2020  . Positive RPR test 06/24/2020  . Prediabetes 02/16/2020  . Type B blood, Rh positive 02/16/2020  . Obesity in pregnancy 02/16/2020    Past Surgical History:  Procedure Laterality Date  . CESAREAN SECTION  09/05/2020   Procedure: CESAREAN SECTION;  Surgeon: Hildred Laser, MD;  Location: [facility] ORS;  Service: Obstetrics;;  . no surgical history    . ROOT CANAL    . WISDOM TOOTH EXTRACTION     1    Prior to Admission medications   Medication Sig Start Date End Date Taking? Authorizing Provider  acetaminophen (TYLENOL) 500 MG tablet Take 2 tablets (1,000 mg total) by mouth every 6 (six) hours. 09/07/20    Lawhorn, Vanessa [place], CNM  gabapentin (NEURONTIN) 600 MG tablet Take 1 tablet (600 mg total) by mouth daily. 09/07/20   Lawhorn, Vanessa [place], CNM  ibuprofen (ADVIL) 600 MG tablet Take 1 tablet (600 mg total) by mouth every 6 (six) hours. 09/07/20   Lawhorn, Vanessa [place], CNM  Iron-FA-B Cmp-C-Biot-Probiotic (FUSION PLUS) CAPS Take by mouth. Patient not taking: Reported on 09/04/2020    [provider]  oxyCODONE (OXY IR/ROXICODONE) 5 MG immediate release tablet Take 1-2 tablets (5-10 mg total) by mouth every 4 (four) hours as needed for moderate pain or severe pain. 09/07/20   Lawhorn, Vanessa [place], CNM  Pediatric Multiple Vit-C-FA (FLINSTONES GUMMIES OMEGA-3 DHA PO) Take by mouth.    [provider]  senna-docusate (SENOKOT-S) 8.6-50 MG tablet Take 2 tablets by mouth daily. 09/07/20   Lawhorn, Vanessa [place], CNM  simethicone (MYLICON) 80 MG chewable tablet Chew 1 tablet (80 mg total) by mouth as needed for flatulence. 09/07/20   Gunnar Bulla, CNM     Allergies Miconazole  Family History  Problem Relation Age of Onset  . Hypertension Mother   . Healthy Maternal Grandmother   . Healthy Maternal Grandfather     Social History Social History   Tobacco Use  . Smoking status: Never Smoker  . Smokeless tobacco: Never Used  Vaping Use  . Vaping Use: Never used  Substance Use Topics  . Alcohol use: Not Currently  .  Drug use: Never    Review of Systems  Constitutional: No fever/chills Eyes: No visual changes.  ENT: No sore throat. Cardiovascular: As above Respiratory: Denies shortness of breath. Gastrointestinal: No abdominal pain.  No nausea, no vomiting.   Genitourinary: Negative for dysuria. Musculoskeletal: Negative for back pain. Skin: Negative for rash. Neurological: Negative for headaches or weakness   ____________________________________________   PHYSICAL EXAM:  VITAL SIGNS: ED Triage Vitals  Enc Vitals Group     BP  09/10/20 0918 (!) 164/106     Pulse Rate 09/10/20 0918 69     Resp 09/10/20 0918 20     Temp 09/10/20 0918 98.4 F (36.9 C)     Temp Source 09/10/20 0918 Oral     SpO2 09/10/20 0918 99 %     Weight 09/10/20 0917 105.2 kg (232 lb)     Height 09/10/20 0917 1.626 m (5\' 4" )     Head Circumference --      Peak Flow --      Pain Score 09/10/20 0917 4     Pain Loc --      Pain Edu? --      Excl. in GC? --     Constitutional: Alert and oriented.   Nose: No congestion/rhinnorhea. Mouth/Throat: Mucous membranes are moist.   Neck:  Painless ROM Cardiovascular: Normal rate, regular rhythm. Grossly normal heart sounds.  Good peripheral circulation. Respiratory: Normal respiratory effort.  No retractions. Lungs CTAB. Gastrointestinal: Soft and nontender. No distention.  No CVA tenderness.  Musculoskeletal: Mild edema ankles bilaterally warm and well perfused Neurologic:  Normal speech and language. No gross focal neurologic deficits are appreciated.  Skin:  Skin is warm, dry and intact. No rash noted. Psychiatric: Mood and affect are normal. Speech and behavior are normal.  ____________________________________________   LABS (all labs ordered are listed, but only abnormal results are displayed)  Labs Reviewed  CBC - Abnormal; Notable for the following components:      Result Value   RBC 3.43 (*)    Hemoglobin 9.8 (*)    HCT 29.9 (*)    All other components within normal limits  COMPREHENSIVE METABOLIC PANEL - Abnormal; Notable for the following components:   Calcium 8.8 (*)    Total Protein 6.4 (*)    Albumin 3.0 (*)    All other components within normal limits  BRAIN NATRIURETIC PEPTIDE - Abnormal; Notable for the following components:   B Natriuretic Peptide 241.6 (*)    All other components within normal limits  URINALYSIS, COMPLETE (UACMP) WITH MICROSCOPIC - Abnormal; Notable for the following components:   Color, Urine YELLOW (*)    APPearance CLEAR (*)    Hgb urine  dipstick LARGE (*)    Leukocytes,Ua MODERATE (*)    All other components within normal limits  TROPONIN I (HIGH SENSITIVITY)   ____________________________________________  EKG  ED ECG REPORT I, 09/12/20, the attending physician, personally viewed and interpreted this ECG.  Date: 09/10/2020  Rhythm: normal sinus rhythm QRS Axis: normal Intervals: normal ST/T Wave abnormalities: normal Narrative Interpretation: no evidence of acute ischemia  ____________________________________________  RADIOLOGY  Chest x-ray reviewed by me, no edema ____________________________________________   PROCEDURES  Procedure(s) performed: No  Procedures   Critical Care performed: o ____________________________________________   INITIAL IMPRESSION / ASSESSMENT AND PLAN / ED COURSE  Pertinent labs & imaging results that were available during my care of the patient were reviewed by me and considered in my medical decision making (see chart for  details).  Patient presents with intermittent vague chest discomfort over the last several days, none this morning, feels well currently.  She is checking herself in today, she is here with her 54-day-old who accidentally fell last night and is the reason that she came to the emergency department.  No shortness of breath.  Mild ankle swelling as noted above, unclear whether this is postpartum or not.  EKG is reassuring, high sensitive troponin is normal.  X-ray without evidence of edema  Mildly elevated BNP, discussed with patient that she will need close follow-up with cardiology for echocardiogram and further work-up return to the ED if any worsening symptoms  ____________________________________________   FINAL CLINICAL IMPRESSION(S) / ED DIAGNOSES  Final diagnoses:  Atypical chest pain        Note:  This document was prepared using Dragon voice recognition software and may include unintentional dictation errors.   Jene Every,  MD 09/10/20 [phone]

## 2020-09-14 ENCOUNTER — Ambulatory Visit (INDEPENDENT_AMBULATORY_CARE_PROVIDER_SITE_OTHER): Payer: BC Managed Care – PPO | Admitting: Cardiology

## 2020-09-14 ENCOUNTER — Other Ambulatory Visit: Payer: Self-pay

## 2020-09-14 ENCOUNTER — Encounter: Payer: Self-pay | Admitting: Cardiology

## 2020-09-14 VITALS — BP 110/70 | HR 49 | Ht 64.0 in | Wt 216.0 lb

## 2020-09-14 DIAGNOSIS — R079 Chest pain, unspecified: Secondary | ICD-10-CM

## 2020-09-14 NOTE — Progress Notes (Signed)
Cardiology Office Note:    Date:  09/14/2020   ID:  Brenda Mccoy, DOB 15-Jun-1995, MRN [id number]  PCP:  [facility]   [facility] Providers Cardiologist:  None     Referring MD: Jene Every, MD   Chief Complaint  Patient presents with  . New Patient (Initial Visit)    [facility] follow up - Chest pain. Meds reviewed verbally with patient.    Brenda Mccoy is a 25 y.o. female who is being seen today for the evaluation of chest pain at the request of Jene Every, MD.   History of Present Illness:    Brenda Mccoy is a 25 y.o. female with a hx of anemia who presents due to chest pain.  She states having chest discomfort 4 to 5 days ago while at home.  She had just given birth 3 days earlier.  Due to symptoms, patient was seen in the ED on 09/10/2020 .  Denies shortness of breath.  Work-up in the ED with EKG and troponins did not reveal any evidence for ischemia.  Denies having any further episodes of chest discomfort.  Attributes symptoms to recent delivery, fatigue, stress.  She denies any history of heart disease.  Past Medical History:  Diagnosis Date  . Anemia     Past Surgical History:  Procedure Laterality Date  . CESAREAN SECTION  09/05/2020   Procedure: CESAREAN SECTION;  Surgeon: Hildred Laser, MD;  Location: [facility] ORS;  Service: Obstetrics;;  . no surgical history    . ROOT CANAL    . WISDOM TOOTH EXTRACTION     1    Current Medications: Current Meds  Medication Sig  . acetaminophen (TYLENOL) 500 MG tablet Take 2 tablets (1,000 mg total) by mouth every 6 (six) hours.  . gabapentin (NEURONTIN) 600 MG tablet Take 1 tablet (600 mg total) by mouth daily.  [name] ibuprofen (ADVIL) 600 MG tablet Take 1 tablet (600 mg total) by mouth every 6 (six) hours.  . Iron-FA-B Cmp-C-Biot-Probiotic (FUSION PLUS) CAPS Take by mouth.  . oxyCODONE (OXY IR/ROXICODONE) 5 MG immediate release tablet Take 1-2 tablets (5-10 mg total) by mouth every 4 (four) hours as needed  for moderate pain or severe pain.  . Pediatric Multiple Vit-C-FA (FLINSTONES GUMMIES OMEGA-3 DHA PO) Take by mouth.     Allergies:   Miconazole   Social History   Socioeconomic History  . Marital status: Single    Spouse name: Not on file  . Number of children: Not on file  . Years of education: Not on file  . Highest education level: Not on file  Occupational History  . Not on file  Tobacco Use  . Smoking status: Never Smoker  . Smokeless tobacco: Never Used  Vaping Use  . Vaping Use: Never used  Substance and Sexual Activity  . Alcohol use: Not Currently  . Drug use: Never  . Sexual activity: Yes  Other Topics Concern  . Not on file  Social History Narrative  . Not on file   Social Determinants of Health   Financial Resource Strain: Not on file  Food Insecurity: Not on file  Transportation Needs: Not on file  Physical Activity: Not on file  Stress: Not on file  Social Connections: Not on file     Family History: The patient's family history includes Healthy in her maternal grandfather and maternal grandmother; Hypertension in her mother.  ROS:   Please see the history of present illness.     All other systems  reviewed and are negative.  EKGs/Labs/Other Studies Reviewed:    The following studies were reviewed today:   EKG:  EKG is  ordered today.  The ekg ordered today demonstrates sinus bradycardia, otherwise normal ECG  Recent Labs: 02/03/2020: TSH 2.660 09/10/2020: ALT 14; B Natriuretic Peptide 241.6; BUN 13; Creatinine, Ser 0.78; Hemoglobin 9.8; Platelets 284; Potassium 3.8; Sodium 139  Recent Lipid Panel No results found for: CHOL, TRIG, HDL, CHOLHDL, VLDL, LDLCALC, LDLDIRECT   Risk Assessment/Calculations:      Physical Exam:    VS:  BP 110/70 (BP Location: Left Arm, Patient Position: Sitting, Cuff Size: Normal)   Pulse (!) 49   Ht 5\' 4"  (1.626 m)   Wt 216 lb (98 kg)   SpO2 98%   BMI 37.08 kg/m     Wt Readings from Last 3 Encounters:   09/14/20 216 lb (98 kg)  09/10/20 232 lb (105.2 kg)  09/04/20 232 lb (105.2 kg)     GEN:  Well nourished, well developed in no acute distress HEENT: Normal NECK: No JVD; No carotid bruits LYMPHATICS: No lymphadenopathy CARDIAC: Regular, bradycardic, no murmurs, rubs, gallops RESPIRATORY:  Clear to auscultation without rales, wheezing or rhonchi  ABDOMEN: Soft, non-tender, non-distended MUSCULOSKELETAL:  No edema; No deformity  SKIN: Warm and dry NEUROLOGIC:  Alert and oriented x 3 PSYCHIATRIC:  Normal affect   ASSESSMENT:    1. Chest pain, unspecified type    PLAN:    In order of problems listed above:  1. Chest discomfort, likely not of cardiac origin.  Currently asymptomatic, has no risk factors for heart disease.  She is low risk for cardiac/CAD.  Discomfort could be stress related.  Patient advised to monitor symptoms closely, additional testing may be considered if symptoms return and consistent with cardiac etiology.  Follow-up as needed   Medication Adjustments/Labs and Tests Ordered: Current medicines are reviewed at length with the patient today.  Concerns regarding medicines are outlined above.  Orders Placed This Encounter  Procedures  . EKG 12-Lead   No orders of the defined types were placed in this encounter.   Patient Instructions  Medication Instructions:   Your physician recommends that you continue on your current medications as directed. Please refer to the Current Medication list given to you today.  *If you need a refill on your cardiac medications before your next appointment, please call your pharmacy*   Lab Work: None ordered If you have labs (blood work) drawn today and your tests are completely normal, you will receive your results only by: 09/06/20 MyChart Message (if you have MyChart) OR . A paper copy in the mail If you have any lab test that is abnormal or we need to change your treatment, we will call you to review the  results.   Testing/Procedures: None ordered   Follow-Up: At [facility], you and your health needs are our priority.  As part of our continuing mission to provide you with exceptional heart care, we have created designated Provider Care Teams.  These Care Teams include your primary Cardiologist (physician) and Advanced Practice Providers (APPs -  Physician Assistants and Nurse Practitioners) who all work together to provide you with the care you need, when you need it.  We recommend signing up for the patient portal called "MyChart".  Sign up information is provided on this After Visit Summary.  MyChart is used to connect with patients for Virtual Visits (Telemedicine).  Patients are able to view lab/test results, encounter notes, upcoming appointments,  etc.  Non-urgent messages can be sent to your provider as well.   To learn more about what you can do with MyChart, go to [email or web address].    Your next appointment:   Follow up as needed   The format for your next appointment:   In Person  Provider:   Debbe Odea, MD   Other Instructions      Signed, Debbe Odea, MD  09/14/2020 9:35 AM    [facility]

## 2020-09-14 NOTE — Patient Instructions (Signed)

## 2020-09-16 ENCOUNTER — Ambulatory Visit (INDEPENDENT_AMBULATORY_CARE_PROVIDER_SITE_OTHER): Payer: BC Managed Care – PPO | Admitting: Certified Nurse Midwife

## 2020-09-16 ENCOUNTER — Other Ambulatory Visit: Payer: Self-pay

## 2020-09-16 ENCOUNTER — Encounter: Payer: Self-pay | Admitting: Certified Nurse Midwife

## 2020-09-16 VITALS — BP 114/80 | HR 72 | Resp 16 | Ht 64.0 in | Wt 212.5 lb

## 2020-09-16 DIAGNOSIS — Z4889 Encounter for other specified surgical aftercare: Secondary | ICD-10-CM

## 2020-09-16 NOTE — Patient Instructions (Addendum)
Postpartum Care After Cesarean Delivery This sheet gives you information about how to care for yourself from the time you deliver your baby to up to 6-12 weeks after delivery (postpartum period). Your health care provider may also give you more specific instructions. If you have problems or questions, contact your health care provider. Follow these instructions at home: Medicines  Take over-the-counter and prescription medicines only as told by your health care provider.  If you were prescribed an antibiotic medicine, take it as told by your health care provider. Do not stop taking the antibiotic even if you start to feel better.  Ask your health care provider if the medicine prescribed to you: ? Requires you to avoid driving or using heavy machinery. ? Can cause constipation. You may need to take actions to prevent or treat constipation, such as:  Drink enough fluid to keep your urine pale yellow.  Take over-the-counter or prescription medicines.  Eat foods that are high in fiber, such as beans, whole grains, and fresh fruits and vegetables.  Limit foods that are high in fat and processed sugars, such as fried or sweet foods. Activity  Gradually return to your normal activities as told by your health care provider.  Avoid activities that take a lot of effort and energy (are strenuous) until approved by your health care provider. Walking at a slow to moderate pace is usually safe. Ask your health care provider what activities are safe for you. ? Do not lift anything that is heavier than your baby or 10 lb (4.5 kg) as told by your health care provider. ? Do not vacuum, climb stairs, or drive a car for as long as told by your health care provider.  If possible, have someone help you at home until you are able to do your usual activities yourself.  Rest as much as possible. Try to rest or take naps while your baby is sleeping. Vaginal bleeding  It is normal to have vaginal bleeding  (lochia) after delivery. Wear a sanitary pad to absorb vaginal bleeding and discharge. ? During the first week after delivery, the amount and appearance of lochia is often similar to a menstrual period. ? Over the next few weeks, it will gradually decrease to a dry, yellow-brown discharge. ? For most women, lochia stops completely by 4-6 weeks after delivery. Vaginal bleeding can vary from woman to woman.  Change your sanitary pads frequently. Watch for any changes in your flow, such as: ? A sudden increase in volume. ? A change in color. ? Large blood clots.  If you pass a blood clot, save it and call your health care provider to discuss. Do not flush blood clots down the toilet before you get instructions from your health care provider.  Do not use tampons or douches until your health care provider says this is safe.  If you are not breastfeeding, your period should return 6-8 weeks after delivery. If you are breastfeeding, your period may return anytime between 8 weeks after delivery and the time that you stop breastfeeding. Perineal care  If your C-section (Cesarean section) was unplanned, and you were allowed to labor and push before delivery, you may have pain, swelling, and discomfort of the tissue between your vaginal opening and your anus (perineum). You may also have an incision in the tissue (episiotomy) or the tissue may have torn during delivery. Follow these instructions as told by your health care provider: ? Keep your perineum clean and dry as told by your   health care provider. Use medicated pads and pain-relieving sprays and creams as directed. ? If you have an episiotomy or vaginal tear, check the area every day for signs of infection. Check for:  Redness, swelling, or pain.  Fluid or blood.  Warmth.  Pus or a bad smell. ? You may be given a squirt bottle to use instead of wiping to clean the perineum area after you go to the bathroom. As you start healing, you may use  the squirt bottle before wiping yourself. Make sure to wipe gently. ? To relieve pain caused by an episiotomy, vaginal tear, or hemorrhoids, try taking a warm sitz bath 2-3 times a day. A sitz bath is a warm water bath that is taken while you are sitting down. The water should only come up to your hips and should cover your buttocks.   Breast care  Within the first few days after delivery, your breasts may feel heavy, full, and uncomfortable (breast engorgement). You may also have milk leaking from your breasts. Your health care provider can suggest ways to help relieve breast discomfort. Breast engorgement should go away within a few days.  If you are breastfeeding: ? Wear a bra that supports your breasts and fits you well. ? Keep your nipples clean and dry. Apply creams and ointments as told by your health care provider. ? You may need to use breast pads to absorb milk leakage. ? You may have uterine contractions every time you breastfeed for several weeks after delivery. Uterine contractions help your uterus return to its normal size. ? If you have any problems with breastfeeding, work with your health care provider or a lactation consultant.  If you are not breastfeeding: ? Avoid touching your breasts as this can make your breasts produce more milk. ? Wear a well-fitting bra and use cold packs to help with swelling. ? Do not squeeze out (express) milk. This causes you to make more milk. Intimacy and sexuality  Ask your health care provider when you can engage in sexual activity. This may depend on your: ? Risk of infection. ? Healing rate. ? Comfort and desire to engage in sexual activity.  You are able to get pregnant after delivery, even if you have not had your period. If desired, talk with your health care provider about methods of family planning or birth control (contraception). Lifestyle  Do not use any products that contain nicotine or tobacco, such as cigarettes, e-cigarettes,  and chewing tobacco. If you need help quitting, ask your health care provider.  Do not drink alcohol, especially if you are breastfeeding. Eating and drinking  Drink enough fluid to keep your urine pale yellow.  Eat high-fiber foods every day. These may help prevent or relieve constipation. High-fiber foods include: ? Whole grain cereals and breads. ? Brown rice. ? Beans. ? Fresh fruits and vegetables.  Take your prenatal vitamins until your postpartum checkup or until your health care provider tells you it is okay to stop.   General instructions  Keep all follow-up visits for you and your baby as told by your health care provider. Most women visit their health care provider for a postpartum checkup within the first 3-6 weeks after delivery. Contact a health care provider if you:  Feel unable to cope with the changes that a new baby brings to your life, and these feelings do not go away.  Feel unusually sad or worried.  Have breasts that are painful, hard, or turn red.  Have a   fever.  Have trouble holding urine or keeping urine from leaking.  Have little or no interest in activities you used to enjoy.  Have not breastfed at all and you have not had a menstrual period for 12 weeks after delivery.  Have stopped breastfeeding and you have not had a menstrual period for 12 weeks after you stopped breastfeeding.  Have questions about caring for yourself or your baby.  Pass a blood clot from your vagina. Get help right away if you:  Have chest pain.  Have difficulty breathing.  Have sudden, severe leg pain.  Have severe pain or cramping in your abdomen.  Bleed from your vagina so much that you fill more than one sanitary pad in one hour. Bleeding should not be heavier than your heaviest period.  Develop a severe headache.  Faint.  Have blurred vision or spots in your vision.  Have a bad-smelling vaginal discharge.  Have thoughts about hurting yourself or your  baby. If you ever feel like you may hurt yourself or others, or have thoughts about taking your own life, get help right away. You can go to your nearest emergency department or call:  Your local emergency services (911 in the [place]  A suicide crisis helpline, such as the National Suicide Prevention Lifeline at 331-245-3982. This is open 24 hours a day. Summary  The period of time from when you deliver your baby to up to 6-12 weeks after delivery is called the postpartum period.  Gradually return to your normal activities as told by your health care provider.  Keep all follow-up visits for you and your baby as told by your health care provider. This information is not intended to replace advice given to you by your health care provider. Make sure you discuss any questions you have with your health care provider. Document Revised: 11/28/2017 Document Reviewed: 11/28/2017 Elsevier Patient Education  2021 Elsevier Inc.  Breast Pumping Tips Breast pumping is a way to get milk out of your breasts. You will then store the milk for your baby to use when you are away from home. There are three ways to pump.  You can use your hand to massage and squeeze your breast (hand expression).  You can use a hand-held machine to manually pump your milk.  You can use an electric machine to pump your milk. In the beginning you may not get much milk. After a few days, your breasts should make more. Pumping can help you start making milk after your baby is born. Pumping helps you to keep making milk when you are away from your baby. When should I pump? You can start pumping soon after your baby is born. Follow these tips:  When you are with your baby: ? Pump after you breastfeed. ? Pump from the free breast while you breastfeed.  When you are away from your baby: ? Pump every 2-3 hours for 15 minutes. ? Pump both breasts at the same time if you can.  If your baby drinks formula, pump around the  time your baby gets the formula.  If you drank alcohol, wait 2 hours before you pump.  If you are going to have surgery, ask your doctor when you should pump again. How do I get ready to pump? Try to relax. Try these things to help your milk come in:  Smell your baby's blanket or clothes.  Look at a picture or video of your baby.  Sit in a quiet, private space.  Place  a cloth on your breast. The cloth should be warm and a little wet.  Massage your breast and nipple.  Play relaxing music.  Picture your milk flowing.  Drink water and eat a snack. What are some tips? General tips for pumping breast milk  Always wash your hands with soap and water for at least 20 seconds before pumping.  If you do not get much milk or if pumping hurts, try different pump settings or a different kind of pump.  Drink enough fluid so your pee (urine) is clear or pale yellow.  Wear clothing that opens in the front or is easy to take off.  Pump milk into a clean bottle or container.  Do not smoke or use any products that contain nicotine or tobacco. If you need help quitting, ask your doctor.  Try to get a hands-free pumping bra, if possible. This makes it easy to pump breast milk. You can buy one or make your own.   Tips for storing breast milk  Store breast milk in a clean, BPA-free container. These include: ? A glass or plastic bottle. ? A milk storage bag.  Store only 2-4 ounces of breast milk in each container.  Swirl the breast milk in the container. Do not shake it.  Write down the date you pumped the milk on the container.  This is how long you can store breast milk: ? Room temperature: 6-8 hours. It is best to use the milk within 4 hours. ? Cooler with ice packs: 24 hours. ? Refrigerator: 5-8 days, if the milk is clean. It is best to use the milk within 3 days. ? Freezer: 9-12 months, if the milk is clean and stored away from the freezer door. It is best to use the milk within 6  months.  Put milk in the back of the refrigerator or freezer.  Thaw frozen milk using warm water. Do not use the microwave.   Tips for choosing a breast pump When choosing a pump, keep the following things in mind:  Manual breast pumps do not need electricity. They cost less. They can be hard to use.  Electric breast pumps use electricity. They are more expensive. They are easier to use. They collect more milk.  The suction cup (flange) should be the right size.  Before you buy the pump, check if your insurance will pay for it. Tips for caring for a breast pump  Check the manual that came with your pump for cleaning tips.  Try not to touch the inside of pump parts.  Clean the pump after you use it. To do this: ? Wipe down the electrical part. Use a dry cloth or paper towel. Do not put this part in water or in cleaning products. ? Wash the plastic parts with soap and warm water. Or use the dishwasher if the manual says it is safe. You do not need to clean the tubing unless it touched breast milk. ? Let all the parts air dry. Avoid drying them with a cloth or towel. ? When the parts are clean and dry, put the pump back together. Then store the pump.  If there is water in the tubing when you want to pump: 1. Attach the tubing to the pump. 2. Turn on the pump to dry the tubing. 3. Turn off the pump when the tube is dry. Summary  Pumping can help you start making milk after your baby is born. It lets you keep making milk when  you are away from your baby.  When you are away from your baby, pump for about 15 minutes every 2-3 hours. Pump both breasts at the same time, if you can. This information is not intended to replace advice given to you by your health care provider. Make sure you discuss any questions you have with your health care provider. Document Revised: 01/20/2020 Document Reviewed: 01/20/2020 Elsevier Patient Education  2021 [employer].

## 2020-09-16 NOTE — Progress Notes (Signed)
  OBSTETRICS/GYNECOLOGY POST-OPERATIVE CLINIC VISIT  Subjective:     Brenda Mccoy is a 25 y.o. female who presents to the clinic 1 week status post primary c-section for fetal malpresentation and fetal intolerance to labor remote from delivery.   Eating a regular diet without difficulty. Bowel movements are normal.  Pain is controlled with current analgesics. Medications being used: ibuprofen (OTC).   Formula feeding due to nipple pain. Reports low milk supply-one (1) ounce-when pumping; has pumped twice since arriving home from hospital.   Seen in ER for chest pain and follow up with Cardiology completed; see notes for further details.   Denies difficulty breathing or respiratory distress, abdominal pain, excessive vaginal bleeding, dysuria, and leg pain or swelling.   The following portions of the patient's history were reviewed and updated as appropriate: allergies, current medications, past family history, past medical history, past social history, past surgical history and problem list.  Review of Systems Pertinent items are noted in HPI.    Objective:    BP 114/80   Pulse 72   Resp 16   Ht 5\' 4"  (1.626 m)   Wt 212 lb 8 oz (96.4 kg)   BMI 36.48 kg/m    General:  alert and no distress  Abdomen: soft, bowel sounds active, non-tender  Incision:   healing well, no drainage, no erythema, no hernia, no seroma, no swelling, no dehiscence, incision well approximated    Assessment:   Doing well postoperatively.   Plan:   1. Continue any current medications. 2. Wound care discussed. 3. Encouraged pumping every four (4) hours; Lactation consult order sent.  4. Activity restrictions: no lifting more than 10 pounds. 5. Follow up: 2 weeks for TELEVISIT MOOD CHECK.     , CNM [facility], [facility] 09/16/20 1:51 PM

## 2020-09-17 ENCOUNTER — Encounter: Payer: BC Managed Care – PPO | Admitting: Certified Nurse Midwife

## 2020-09-23 ENCOUNTER — Encounter: Payer: BC Managed Care – PPO | Admitting: Certified Nurse Midwife

## 2020-09-30 ENCOUNTER — Ambulatory Visit (INDEPENDENT_AMBULATORY_CARE_PROVIDER_SITE_OTHER): Payer: BC Managed Care – PPO | Admitting: Certified Nurse Midwife

## 2020-09-30 ENCOUNTER — Encounter: Payer: Self-pay | Admitting: Certified Nurse Midwife

## 2020-09-30 ENCOUNTER — Other Ambulatory Visit: Payer: Self-pay

## 2020-09-30 DIAGNOSIS — Z3009 Encounter for other general counseling and advice on contraception: Secondary | ICD-10-CM

## 2020-09-30 DIAGNOSIS — Z1331 Encounter for screening for depression: Secondary | ICD-10-CM

## 2020-09-30 NOTE — Progress Notes (Signed)
Virtual Visit via Telephone Note  I connected with Brenda Mccoy on 09/30/20 at 11:30 AM EDT by telephone and verified that I am speaking with the correct person using two identifiers.  Location:  Patient: Brenda Mccoy (home)  Provider: [name], CNM ([facility], [facility])   I discussed the limitations, risks, security and privacy concerns of performing an evaluation and management service by telephone and the availability of in person appointments. I also discussed with the patient that there may be a patient responsible charge related to this service. The patient expressed understanding and agreed to proceed.   History of Present Illness:  Patient is three (3) weeks POSTPARTUM cesarean section.   Reports her bleeding stopped last week.   She is breastfeeding, pumping, and supplementing with formula as needed.   Considering the patch as contraception.   Denies difficulty breathing or respiratory distress chest pain, abdominal pain, excessive vaginal bleeding, dysuria and leg pain or swelling.     Observations/Objective:  Depression screen [facility] 2/9 09/30/2020 08/23/2020 07/30/2020 07/15/2020  Decreased Interest 0 0 0 0  Down, Depressed, Hopeless 0 0 0 0  PHQ - 2 Score 0 0 0 0  Altered sleeping 0 - - -  Tired, decreased energy 0 - - -  Change in appetite 0 - - -  Feeling bad or failure about yourself  0 - - -  Trouble concentrating 0 - - -  Moving slowly or fidgety/restless 0 - - -  Suicidal thoughts 0 - - -  PHQ-9 Score 0 - - -    GAD 7 : Generalized Anxiety Score 09/30/2020  Nervous, Anxious, on Edge 0  Control/stop worrying 0  Worry too much - different things 0  Trouble relaxing 0  Restless 0  Easily annoyed or irritable 0  Afraid - awful might happen 0  Total GAD 7 Score 0     Assessment:  Postpartum care and examination  Lactating mother  Depression screening negative  Plan:  Routine postpartum care and education.   Reviewed red flag  symptoms and when to call  RTC as scheduled on 10/18/2020 1045 for PPV or sooner if needed.   Follow Up Instructions:   I discussed the assessment and treatment plan with the patient. The patient was provided an opportunity to ask questions and all were answered. The patient agreed with the plan and demonstrated an understanding of the instructions.   The patient was advised to call back or seek an in-person evaluation if the symptoms worsen or if the condition fails to improve as anticipated.  I provided 6 minutes of non-face-to-face time during this encounter.   [name], CNM [facility], [facility] 09/30/20 11:20 AM

## 2020-10-12 ENCOUNTER — Telehealth: Payer: Self-pay

## 2020-10-12 NOTE — Telephone Encounter (Signed)
Patient got her period last Thursday. It started off being light brown. Yesterday she started bleeding heavily. She is having to change her pad every 3 hours. Her period is suppose to go off tomorrow. She would like to know is the heavy bleeding normal and if it doesn't go off tomorrow like it is suppose to, what should she do.

## 2020-10-13 NOTE — Telephone Encounter (Signed)
Please contact patient. Bleeding sounds normal. Excessive bleeding is defined as saturating more than one (1) pad an hour for more than one (1) hour in a row. Periods may increased in amount immediately following pregnancy/birth and breastfeeding. Follow up if bleeding continues. Thanks, JML

## 2020-10-13 NOTE — Telephone Encounter (Signed)
Patient called.  Patient aware.  

## 2020-10-14 ENCOUNTER — Encounter: Payer: BC Managed Care – PPO | Admitting: Certified Nurse Midwife

## 2020-10-18 ENCOUNTER — Ambulatory Visit (INDEPENDENT_AMBULATORY_CARE_PROVIDER_SITE_OTHER): Payer: BC Managed Care – PPO | Admitting: Certified Nurse Midwife

## 2020-10-18 ENCOUNTER — Other Ambulatory Visit: Payer: Self-pay

## 2020-10-18 DIAGNOSIS — Z3009 Encounter for other general counseling and advice on contraception: Secondary | ICD-10-CM | POA: Diagnosis not present

## 2020-10-18 DIAGNOSIS — Z1331 Encounter for screening for depression: Secondary | ICD-10-CM | POA: Diagnosis not present

## 2020-10-18 NOTE — Progress Notes (Signed)
Subjective:    Tuleen Mandelbaum is a 25 y.o. [id number] African American female who presents for a postpartum visit. She is 6 weeks postpartum following a primary cesarean section, low transverse incision and cesarean indication: non-reassuring fetal status at 39+6 gestational weeks. Anesthesia: epidural and spinal. I have fully reviewed the prenatal and intrapartum course.   Postpartum course has been complicated by resolved chest pain and slow healing incision.   Baby's course has been uncomplicated. Baby is feeding by both breast and bottle - Goodstart Gentle .   Bleeding no bleeding.   Bowel function is normal. Bladder function is normal.   Patient is sexually active. Contraception method is coitus interruptus.   Postpartum depression screening: negative. Score 0.    Last pap 09/2019 and was negative.  Denies difficulty breathing or respiratory distress, chest pain, abdominal pain, dysuria, and leg pain or swelling.   The following portions of the patient's history were reviewed and updated as appropriate: allergies, current medications, past medical history, past surgical history and problem list.  Review of Systems  Pertinent items are noted in HPI.   Objective:   BP 115/70   Pulse 63   Wt 211 lb 9.6 oz (96 kg)   Breastfeeding Yes   BMI 36.32 kg/m   General:  Alert, cooperative and no distress   Breasts:  Deferred, no complaints  Lungs: Lactating   Heart:  Regular rate and rhythm  Abdomen: Soft, nontender   Vulva: Normal  Vagina: Normal vagina  Cervix:  Closed  Corpus: Well-involuted  Adnexa:  Non-palpable     Depression screen [facility] 2/9 10/18/2020 09/30/2020 08/23/2020 07/30/2020 07/15/2020  Decreased Interest 0 0 0 0 0  Down, Depressed, Hopeless 0 0 0 0 0  PHQ - 2 Score 0 0 0 0 0  Altered sleeping 0 0 - - -  Tired, decreased energy 0 0 - - -  Change in appetite 0 0 - - -  Feeling bad or failure about yourself  0 0 - - -  Trouble concentrating 0 0 - - -  Moving slowly  or fidgety/restless 0 0 - - -  Suicidal thoughts 0 0 - - -  PHQ-9 Score 0 0 - - -   GAD 7 : Generalized Anxiety Score 10/18/2020 09/30/2020  Nervous, Anxious, on Edge 0 0  Control/stop worrying 0 0  Worry too much - different things 1 0  Trouble relaxing 0 0  Restless 0 0  Easily annoyed or irritable 1 0  Afraid - awful might happen 0 0  Total GAD 7 Score 2 0  Anxiety Difficulty Not difficult at all -        Assessment:   Postpartum exam Six (6) wks s/p primary cesarean section for non reassuring fetal heart tones Breastfeeding with formula supplementation Depression screening Contraception counseling   Plan:   Encouraged routine health maintenance techniques.   Reviewed all forms of birth control options available including abstinence; fertility period awareness methods; over the counter/barrier methods; hormonal contraceptive medication including pill, patch, ring, injection,contraceptive implant; hormonal and nonhormonal IUDs; permanent sterilization options including vasectomy and the various tubal sterilization modalities. Risks and benefits reviewed.  Questions were answered. Vegan condom options sent via MyCart.   Reviewed red flag symptoms and when to call.   Follow up in: 5 months for ANNUAL EXAM or earlier if needed.   [name], CNM [facility], [facility] 10/18/20 11:11 AM

## 2020-10-18 NOTE — Patient Instructions (Signed)
Preventive Care 21-23 Years Old, Female Preventive care refers to lifestyle choices and visits with your health care provider that can promote health and wellness. This includes: A yearly physical exam. This is also called an annual wellness visit. Regular dental and eye exams. Immunizations. Screening for certain conditions. Healthy lifestyle choices, such as: Eating a healthy diet. Getting regular exercise. Not using drugs or products that contain nicotine and tobacco. Limiting alcohol use. What can I expect for my preventive care visit? Physical exam Your health care provider may check your: Height and weight. These may be used to calculate your BMI (body mass index). BMI is a measurement that tells if you are at a healthy weight. Heart rate and blood pressure. Body temperature. Skin for abnormal spots. Counseling Your health care provider may ask you questions about your: Past medical problems. Family's medical history. Alcohol, tobacco, and drug use. Emotional well-being. Home life and relationship well-being. Sexual activity. Diet, exercise, and sleep habits. Work and work environment. Access to firearms. Method of birth control. Menstrual cycle. Pregnancy history. What immunizations do I need?  Vaccines are usually given at various ages, according to a schedule. Your health care provider will recommend vaccines for you based on your age, medicalhistory, and lifestyle or other factors, such as travel or where you work. What tests do I need?  Blood tests Lipid and cholesterol levels. These may be checked every 5 years starting at age 20. Hepatitis C test. Hepatitis B test. Screening Diabetes screening. This is done by checking your blood sugar (glucose) after you have not eaten for a while (fasting). STD (sexually transmitted disease) testing, if you are at risk. BRCA-related cancer screening. This may be done if you have a family history of breast, ovarian, tubal, or  peritoneal cancers. Pelvic exam and Pap test. This may be done every 3 years starting at age 21. Starting at age 30, this may be done every 5 years if you have a Pap test in combination with an HPV test. Talk with your health care provider about your test results, treatment options,and if necessary, the need for more tests. Follow these instructions at home: Eating and drinking  Eat a healthy diet that includes fresh fruits and vegetables, whole grains, lean protein, and low-fat dairy products. Take vitamin and mineral supplements as recommended by your health care provider. Do not drink alcohol if: Your health care provider tells you not to drink. You are pregnant, may be pregnant, or are planning to become pregnant. If you drink alcohol: Limit how much you have to 0-1 drink a day. Be aware of how much alcohol is in your drink. In the [place] one drink equals one 12 oz bottle of beer (355 mL), one 5 oz glass of wine (148 mL), or one 1 oz glass of hard liquor (44 mL).  Lifestyle Take daily care of your teeth and gums. Brush your teeth every morning and night with fluoride toothpaste. Floss one time each day. Stay active. Exercise for at least 30 minutes 5 or more days each week. Do not use any products that contain nicotine or tobacco, such as cigarettes, e-cigarettes, and chewing tobacco. If you need help quitting, ask your health care provider. Do not use drugs. If you are sexually active, practice safe sex. Use a condom or other form of protection to prevent STIs (sexually transmitted infections). If you do not wish to become pregnant, use a form of birth control. If you plan to become pregnant, see your health care   provider for a prepregnancy visit. Find healthy ways to cope with stress, such as: Meditation, yoga, or listening to music. Journaling. Talking to a trusted person. Spending time with friends and family. Safety Always wear your seat belt while driving or riding in a  vehicle. Do not drive: If you have been drinking alcohol. Do not ride with someone who has been drinking. When you are tired or distracted. While texting. Wear a helmet and other protective equipment during sports activities. If you have firearms in your house, make sure you follow all gun safety procedures. Seek help if you have been physically or sexually abused. What's next? Go to your health care provider once a year for an annual wellness visit. Ask your health care provider how often you should have your eyes and teeth checked. Stay up to date on all vaccines. This information is not intended to replace advice given to you by your health care provider. Make sure you discuss any questions you have with your healthcare provider. Document Revised: 12/07/2019 Document Reviewed: 12/20/2017 Elsevier Patient Education  2022 [employer].

## 2021-03-23 ENCOUNTER — Other Ambulatory Visit (HOSPITAL_COMMUNITY)
Admission: RE | Admit: 2021-03-23 | Discharge: 2021-03-23 | Disposition: A | Discharge status: Home / Self Care | Payer: Medicaid Other | Source: Ambulatory Visit | Attending: Obstetrics and Gynecology | Admitting: Obstetrics and Gynecology

## 2021-03-23 ENCOUNTER — Encounter: Payer: Self-pay | Admitting: Obstetrics and Gynecology

## 2021-03-23 ENCOUNTER — Other Ambulatory Visit: Payer: Self-pay

## 2021-03-23 ENCOUNTER — Ambulatory Visit (INDEPENDENT_AMBULATORY_CARE_PROVIDER_SITE_OTHER): Payer: Medicaid Other | Admitting: Obstetrics and Gynecology

## 2021-03-23 VITALS — BP 106/73 | HR 78 | Ht 64.0 in | Wt 225.0 lb

## 2021-03-23 DIAGNOSIS — R638 Other symptoms and signs concerning food and fluid intake: Secondary | ICD-10-CM | POA: Diagnosis not present

## 2021-03-23 DIAGNOSIS — O9081 Anemia of the puerperium: Secondary | ICD-10-CM | POA: Diagnosis not present

## 2021-03-23 DIAGNOSIS — Z01419 Encounter for gynecological examination (general) (routine) without abnormal findings: Secondary | ICD-10-CM | POA: Diagnosis not present

## 2021-03-23 DIAGNOSIS — Z113 Encounter for screening for infections with a predominantly sexual mode of transmission: Secondary | ICD-10-CM | POA: Diagnosis present

## 2021-03-23 DIAGNOSIS — Z13220 Encounter for screening for lipoid disorders: Secondary | ICD-10-CM

## 2021-03-23 NOTE — Patient Instructions (Signed)
Breast Self-Awareness Breast self-awareness is knowing how your breasts look and feel. Doing breast self-awareness is important. It allows you to catch a breast problem early while it is still small and can be treated. All women should do breast self-awareness, including women who have had breast implants. Tell your doctor if you notice a change in your breasts. What you need: A mirror. A well-lit room. How to do a breast self-exam A breast self-exam is one way to learn what is normal for your breasts and to check for changes. To do a breast self-exam: Look for changes  Take off all the clothes above your waist. Stand in front of a mirror in a room with good lighting. Put your hands on your hips. Push your hands down. Look at your breasts and nipples in the mirror to see if one breast or nipple looks different from the other. Check to see if: The shape of one breast is different. The size of one breast is different. There are wrinkles, dips, and bumps in one breast and not the other. Look at each breast for changes in the skin, such as: Redness. Scaly areas. Look for changes in your nipples, such as: Liquid around the nipples. Bleeding. Dimpling. Redness. A change in where the nipples are. Feel for changes  Lie on your back on the floor. Feel each breast. To do this, follow these steps: Pick a breast to feel. Put the arm closest to that breast above your head. Use your other arm to feel the nipple area of your breast. Feel the area with the pads of your three middle fingers by making small circles with your fingers. For the first circle, press lightly. For the second circle, press harder. For the third circle, press even harder. Keep making circles with your fingers at the different pressures as you move down your breast. Stop when you feel your ribs. Move your fingers a little toward the center of your body. Start making circles with your fingers again, this time going up until  you reach your collarbone. Keep making up-and-down circles until you reach your armpit. Remember to keep using the three pressures. Feel the other breast in the same way. Sit or stand in the tub or shower. With soapy water on your skin, feel each breast the same way you did in step 2 when you were lying on the floor. Write down what you find Writing down what you find can help you remember what to tell your doctor. Write down: What is normal for each breast. Any changes you find in each breast, including: The kind of changes you find. Whether you have pain. Size and location of any lumps. When you last had your menstrual period. General tips Check your breasts every month. If you are breastfeeding, the best time to check your breasts is after you feed your baby or after you use a breast pump. If you get menstrual periods, the best time to check your breasts is 5-7 days after your menstrual period is over. With time, you will become comfortable with the self-exam, and you will begin to know if there are changes in your breasts. Contact a doctor if you: See a change in the shape or size of your breasts or nipples. See a change in the skin of your breast or nipples, such as red or scaly skin. Have fluid coming from your nipples that is not normal. Find a lump or thick area that was not there before. Have pain in   your breasts. Have any concerns about your breast health. Summary Breast self-awareness includes looking for changes in your breasts, as well as feeling for changes within your breasts. Breast self-awareness should be done in front of a mirror in a well-lit room. You should check your breasts every month. If you get menstrual periods, the best time to check your breasts is 5-7 days after your menstrual period is over. Let your doctor know of any changes you see in your breasts, including changes in size, changes on the skin, pain or tenderness, or fluid from your nipples that is not  normal. This information is not intended to replace advice given to you by your health care provider. Make sure you discuss any questions you have with your health care provider. Document Revised: 11/27/2017 Document Reviewed: 11/27/2017 Elsevier Patient Education  [place] 68-24 Years Old, Female Preventive care refers to lifestyle choices and visits with your health care provider that can promote health and wellness. At this stage in your life, you may start seeing a primary care physician instead of a pediatrician for your preventive care. Preventive care visits are also called wellness exams. What can I expect for my preventive care visit? Counseling During your preventive care visit, your health care provider may ask about your: Medical history, including: Past medical problems. Family medical history. Pregnancy history. Current health, including: Menstrual cycle. Method of birth control. Emotional well-being. Home life and relationship well-being. Sexual activity and sexual health. Lifestyle, including: Alcohol, nicotine or tobacco, and drug use. Access to firearms. Diet, exercise, and sleep habits. Sunscreen use. Motor vehicle safety. Physical exam Your health care provider may check your: Height and weight. These may be used to calculate your BMI (body mass index). BMI is a measurement that tells if you are at a healthy weight. Waist circumference. This measures the distance around your waistline. This measurement also tells if you are at a healthy weight and may help predict your risk of certain diseases, such as type 2 diabetes and high blood pressure. Heart rate and blood pressure. Body temperature. Skin for abnormal spots. Breasts. What immunizations do I need? Vaccines are usually given at various ages, according to a schedule. Your health care provider will recommend vaccines for you based on your age, medical history, and lifestyle or other  factors, such as travel or where you work. What tests do I need? Screening Your health care provider may recommend screening tests for certain conditions. This may include: Vision and hearing tests. Lipid and cholesterol levels. Pelvic exam and Pap test. Hepatitis B test. Hepatitis C test. HIV (human immunodeficiency virus) test. STI (sexually transmitted infection) testing, if you are at risk. Tuberculosis skin test if you have symptoms. BRCA-related cancer screening. This may be done if you have a family history of breast, ovarian, tubal, or peritoneal cancers. Talk with your health care provider about your test results, treatment options, and if necessary, the need for more tests. Follow these instructions at home: Eating and drinking  Eat a healthy diet that includes fresh fruits and vegetables, whole grains, lean protein, and low-fat dairy products. Drink enough fluid to keep your urine pale yellow. Do not drink alcohol if: Your health care provider tells you not to drink. You are pregnant, may be pregnant, or are planning to become pregnant. You are under the legal drinking age. In the [place] the legal drinking age is 79. If you drink alcohol: Limit how much you have to 0-1 drink a day.  Know how much alcohol is in your drink. In the [place] one drink equals one 12 oz bottle of beer (355 mL), one 5 oz glass of wine (148 mL), or one 1 oz glass of hard liquor (44 mL). Lifestyle Brush your teeth every morning and night with fluoride toothpaste. Floss one time each day. Exercise for at least 30 minutes 5 or more days of the week. Do not use any products that contain nicotine or tobacco. These products include cigarettes, chewing tobacco, and vaping devices, such as e-cigarettes. If you need help quitting, ask your health care provider. Do not use drugs. If you are sexually active, practice safe sex. Use a condom or other form of protection to prevent STIs. If you do not wish to become  pregnant, use a form of birth control. If you plan to become pregnant, see your health care provider for a prepregnancy visit. Find healthy ways to manage stress, such as: Meditation, yoga, or listening to music. Journaling. Talking to a trusted person. Spending time with friends and family. Safety Always wear your seat belt while driving or riding in a vehicle. Do not drive: If you have been drinking alcohol. Do not ride with someone who has been drinking. When you are tired or distracted. While texting. If you have been using any mind-altering substances or drugs. Wear a helmet and other protective equipment during sports activities. If you have firearms in your house, make sure you follow all gun safety procedures. Seek help if you have been bullied, physically abused, or sexually abused. Use the internet responsibly to avoid dangers, such as online bullying and online sex predators. What's next? Go to your health care provider once a year for an annual wellness visit. Ask your health care provider how often you should have your eyes and teeth checked. Stay up to date on all vaccines. This information is not intended to replace advice given to you by your health care provider. Make sure you discuss any questions you have with your health care provider. Document Revised: 10/06/2020 Document Reviewed: 10/06/2020 Elsevier Patient Education  [place].

## 2021-03-23 NOTE — Progress Notes (Signed)
GYNECOLOGY ANNUAL PHYSICAL EXAM PROGRESS NOTE  Subjective:    Brenda Mccoy is a 25 y.o. G30P1001 female who presents for an annual exam. The patient has no complaints today. The patient is sexually active. The patient participates in regular exercise: no. Has the patient ever been transfused or tattooed?: no. The patient reports that there is not domestic violence in her life.   Desires to have STD screening. Has no concerns for exposure.   Menstrual History: Menarche age: 73 Patient's last menstrual period was 03/14/2021 (exact date). Period Cycle (Days): 30 Period Duration (Days): 7 Period Pattern: Regular Menstrual Flow: Moderate Menstrual Control: Maxi pad Menstrual Control Change Freq (Hours): 2-4 Dysmenorrhea: (!) Moderate Dysmenorrhea Symptoms: Cramping  Gynecologic History:  Contraception:  withdrawal History of STI's: Chlamydia Last Pap: 10/13/2019. Results were: normal.  Denies h/o abnormal pap smears. Last mammogram: Not age appropriate   Upstream - 03/23/21 1147       Pregnancy Intention Screening   Does the patient want to become pregnant in the next year? No    Does the patient's partner want to become pregnant in the next year? No    Would the patient like to discuss contraceptive options today? No      Contraception Wrap Up   Current Method Withdrawal or Other Method    End Method Withdrawal or Other Method    Contraception Counseling Provided No            The pregnancy intention screening data noted above was reviewed. Potential methods of contraception were discussed. The patient elected to proceed with Withdrawal or Other Method.  OB History  Gravida Para Term Preterm AB Living  1 1 1  0 0 1  SAB IAB Ectopic Multiple Live Births  0 0 0 0 1    # Outcome Date GA Lbr Len/2nd Weight Sex Delivery Anes PTL Lv  1 Term 09/05/20 [redacted]w[redacted]d  6 lb 10.2 oz (3.01 kg) M CS-LTranv Spinal, EPI  LIV     Name: Lindenberger,BOY Daylah     Apgar1: 7  Apgar5: 9     Past Medical History:  Diagnosis Date   Anemia     Past Surgical History:  Procedure Laterality Date   CESAREAN SECTION  09/05/2020   Procedure: CESAREAN SECTION;  Surgeon: Rubie Maid, MD;  Location: [facility] ORS;  Service: Obstetrics;;   no surgical history     ROOT CANAL     WISDOM TOOTH EXTRACTION     1    Family History  Problem Relation Age of Onset   Hypertension Mother    Healthy Maternal Grandmother    Healthy Maternal Grandfather     Social History   Socioeconomic History   Marital status: Single    Spouse name: Not on file   Number of children: Not on file   Years of education: Not on file   Highest education level: Not on file  Occupational History   Not on file  Tobacco Use   Smoking status: Never   Smokeless tobacco: Never  Vaping Use   Vaping Use: Never used  Substance and Sexual Activity   Alcohol use: Not Currently   Drug use: Never   Sexual activity: Yes  Other Topics Concern   Not on file  Social History Narrative   Not on file   Social Determinants of Health   Financial Resource Strain: Not on file  Food Insecurity: Not on file  Transportation Needs: Not on file  Physical Activity: Not on  file  Stress: Not on file  Social Connections: Not on file  Intimate Partner Violence: Not on file    Current Outpatient Medications on File Prior to Visit  Medication Sig Dispense Refill   Pediatric Multiple Vit-C-FA (FLINSTONES GUMMIES OMEGA-3 DHA PO) Take by mouth.     No current facility-administered medications on file prior to visit.    Allergies  Allergen Reactions   Miconazole Itching     Review of Systems Constitutional: negative for chills, fatigue, fevers and sweats Eyes: negative for irritation, redness and visual disturbance Ears, nose, mouth, throat, and face: negative for hearing loss, nasal congestion, snoring and tinnitus Respiratory: negative for asthma, cough, sputum Cardiovascular: negative for chest pain, dyspnea,  exertional chest pressure/discomfort, irregular heart beat, palpitations and syncope Gastrointestinal: negative for abdominal pain, change in bowel habits, nausea and vomiting Genitourinary: negative for abnormal menstrual periods, genital lesions, sexual problems and vaginal discharge, dysuria and urinary incontinence Integument/breast: negative for breast lump, breast tenderness and nipple discharge Hematologic/lymphatic: negative for bleeding and easy bruising Musculoskeletal:negative for back pain and muscle weakness Neurological: negative for dizziness, headaches, vertigo and weakness Endocrine: negative for diabetic symptoms including polydipsia, polyuria and skin dryness Allergic/Immunologic: negative for hay fever and urticaria      Objective:    Blood pressure 106/73, pulse 78, height 5\' 4"  (1.626 m), weight 225 lb (102.1 kg), last menstrual period 03/14/2021, not currently breastfeeding. Body mass index is 38.62 kg/m.  General Appearance:    Alert, cooperative, no distress, appears stated age  Head:    Normocephalic, without obvious abnormality, atraumatic  Eyes:    PERRL, conjunctiva/corneas clear, EOM's intact, both eyes  Ears:    Normal external ear canals, both ears  Nose:   Nares normal, septum midline, mucosa normal, no drainage or sinus tenderness  Throat:   Lips, mucosa, and tongue normal; teeth and gums normal  Neck:   Supple, symmetrical, trachea midline, no adenopathy; thyroid: no enlargement/tenderness/nodules; no carotid bruit or JVD  Back:     Symmetric, no curvature, ROM normal, no CVA tenderness  Lungs:     Clear to auscultation bilaterally, respirations unlabored  Chest Wall:    No tenderness or deformity   Heart:    Regular rate and rhythm, S1 and S2 normal, no murmur, rub or gallop  Breast Exam:    No tenderness, masses, or nipple abnormality  Abdomen:     Soft, non-tender, bowel sounds active all four quadrants, no masses, no organomegaly.    Genitalia:     Pelvic:external genitalia normal, vagina without lesions, discharge, or tenderness, rectovaginal septum  normal. Cervix normal in appearance, no cervical motion tenderness, no adnexal masses or tenderness.  Uterus normal size, shape, mobile, regular contours, nontender.  Rectal:    Normal external sphincter.  No hemorrhoids appreciated. Internal exam not done.   Extremities:   Extremities normal, atraumatic, no cyanosis or edema  Pulses:   2+ and symmetric all extremities  Skin:   Skin color, texture, turgor normal, no rashes or lesions  Lymph nodes:   Cervical, supraclavicular, and axillary nodes normal  Neurologic:   CNII-XII intact, normal strength, sensation and reflexes throughout   .  Labs:  Lab Results  Component Value Date   WBC 8.9 09/10/2020   HGB 9.8 (L) 09/10/2020   HCT 29.9 (L) 09/10/2020   MCV 87.2 09/10/2020   PLT 284 09/10/2020    Lab Results  Component Value Date   CREATININE 0.78 09/10/2020   BUN 13 09/10/2020  NA 139 09/10/2020   K 3.8 09/10/2020   CL 106 09/10/2020   CO2 25 09/10/2020    Lab Results  Component Value Date   ALT 14 09/10/2020   AST 18 09/10/2020   ALKPHOS 124 09/10/2020   BILITOT 0.5 09/10/2020    Lab Results  Component Value Date   TSH 2.660 02/03/2020     Assessment:   1. Encounter for well woman exam with routine gynecological exam   2. Screen for STD (sexually transmitted disease)   3. Postpartum anemia   4. Increased BMI   5. Screening for lipid disorders      Plan:  Blood tests: CBC with diff, Comprehensive metabolic panel, Lipoproteins, and TSH. Breast self exam technique reviewed and patient encouraged to perform self-exam monthly. Contraception:  withdrawal method . Discussed healthy lifestyle modifications. Mammogram  Not age appropriate Pap smear  UTD . COVID vaccination status: declines Flu vaccine: declines.  Postpartum anemia, will repeat CBC, has likely resolved.  STD screen performed today at patient's  request.  Follow up in 1 year for annual exam   Hildred Laser, MD [facility]

## 2021-03-24 LAB — CBC
Hematocrit: 36.9 % (ref 34.0–46.6)
Hemoglobin: 12.1 g/dL (ref 11.1–15.9)
MCH: 28.5 pg (ref 26.6–33.0)
MCHC: 32.8 g/dL (ref 31.5–35.7)
MCV: 87 fL (ref 79–97)
Platelets: 264 10*3/uL (ref 150–450)
RBC: 4.24 x10E6/uL (ref 3.77–5.28)
RDW: 12.7 % (ref 11.7–15.4)
WBC: 5 10*3/uL (ref 3.4–10.8)

## 2021-03-24 LAB — COMPREHENSIVE METABOLIC PANEL
ALT: 12 IU/L (ref 0–32)
AST: 11 IU/L (ref 0–40)
Albumin/Globulin Ratio: 1.9 (ref 1.2–2.2)
Albumin: 4.8 g/dL (ref 3.9–5.0)
Alkaline Phosphatase: 84 IU/L (ref 44–121)
BUN/Creatinine Ratio: 16 (ref 9–23)
BUN: 12 mg/dL (ref 6–20)
Bilirubin Total: 0.3 mg/dL (ref 0.0–1.2)
CO2: 22 mmol/L (ref 20–29)
Calcium: 9.7 mg/dL (ref 8.7–10.2)
Chloride: 100 mmol/L (ref 96–106)
Creatinine, Ser: 0.73 mg/dL (ref 0.57–1.00)
Globulin, Total: 2.5 g/dL (ref 1.5–4.5)
Glucose: 84 mg/dL (ref 70–99)
Potassium: 4.2 mmol/L (ref 3.5–5.2)
Sodium: 141 mmol/L (ref 134–144)
Total Protein: 7.3 g/dL (ref 6.0–8.5)
eGFR: 118 mL/min/{1.73_m2} (ref >59)

## 2021-03-24 LAB — URINE CYTOLOGY ANCILLARY ONLY
Chlamydia: NEGATIVE
Comment: NEGATIVE
Comment: NEGATIVE
Comment: NORMAL
Neisseria Gonorrhea: NEGATIVE
Trichomonas: NEGATIVE

## 2021-03-24 LAB — LIPID PANEL
Chol/HDL Ratio: 3.1 ratio (ref 0.0–4.4)
Cholesterol, Total: 209 mg/dL — ABNORMAL HIGH (ref 100–199)
HDL: 68 mg/dL (ref >39)
LDL Chol Calc (NIH): 132 mg/dL — ABNORMAL HIGH (ref 0–99)
Triglycerides: 51 mg/dL (ref 0–149)
VLDL Cholesterol Cal: 9 mg/dL (ref 5–40)

## 2021-03-24 LAB — TSH: TSH: 2.05 u[IU]/mL (ref 0.450–4.500)

## 2021-07-19 ENCOUNTER — Other Ambulatory Visit (HOSPITAL_COMMUNITY)
Admission: RE | Admit: 2021-07-19 | Discharge: 2021-07-19 | Disposition: A | Discharge status: Home / Self Care | Payer: Medicaid Other | Source: Ambulatory Visit | Attending: Obstetrics and Gynecology | Admitting: Obstetrics and Gynecology

## 2021-07-19 ENCOUNTER — Other Ambulatory Visit: Payer: Self-pay

## 2021-07-19 ENCOUNTER — Encounter: Payer: Self-pay | Admitting: Obstetrics and Gynecology

## 2021-07-19 ENCOUNTER — Ambulatory Visit (INDEPENDENT_AMBULATORY_CARE_PROVIDER_SITE_OTHER): Payer: Medicaid Other | Admitting: Obstetrics and Gynecology

## 2021-07-19 VITALS — BP 131/79 | HR 83 | Resp 16 | Ht 64.0 in | Wt 230.0 lb

## 2021-07-19 DIAGNOSIS — N644 Mastodynia: Secondary | ICD-10-CM

## 2021-07-19 DIAGNOSIS — Z113 Encounter for screening for infections with a predominantly sexual mode of transmission: Secondary | ICD-10-CM | POA: Diagnosis not present

## 2021-07-19 DIAGNOSIS — N760 Acute vaginitis: Secondary | ICD-10-CM | POA: Diagnosis not present

## 2021-07-19 NOTE — Progress Notes (Signed)
? ? ?  GYNECOLOGY PROGRESS NOTE ? ?Subjective:  ? ? Patient ID: Brenda Mccoy, female    DOB: 1995/06/21, 26 y.o.   MRN: [id number] ? ?HPI ? Patient is a 26 y.o. G16P1001 female who presents for right breast pain and STD screening. She has been having right breast pain x 2 weeks. Pain comes and goes, she denies swelling or redness. She had been having some vaginal itching and she took a Diflucan yesterday. Denies having any other symptoms. Was seen at [facility] 2 weeks ago and feels like she was misdiagnosed. Went to [facility] a day later and was diagnosed with BV, was treated.  Now having itching again, took a Diflucan yesterday.  ? ?The following portions of the patient's history were reviewed and updated as appropriate: allergies, current medications, past family history, past medical history, past social history, past surgical history, and problem list. ? ?Review of Systems ?Pertinent items noted in HPI and remainder of comprehensive ROS otherwise negative.  ? ?Objective:  ?  Blood pressure 131/79, pulse 83, resp. rate 16, height 5\' 4"  (1.626 m), weight 230 lb (104.3 kg), not currently breastfeeding. Body mass index is 39.48 kg/m? Patient's last menstrual period was 06/29/2021 (exact date). ? ?General appearance: alert and no distress ?Breasts: breasts appear normal, no suspicious masses, no skin or nipple changes or axillary nodes. ?Neurologic: grossly intact.  ? ? ? ?Assessment:  ? ?1. Acute vaginitis   ?2. Screen for STD (sexually transmitted disease)   ?3. Breast pain, right   ?  ? ?Plan:  ? ?Patient self-swabbed for vaginal cultures for vaginitis. Declines exam today, has had 2 recent exams.  ?Discussed management of mastalgia. To return for further evaluation if pain persists or worsens.  ? ? ?08/29/2021, MD ?[facility]  ?

## 2021-07-19 NOTE — Patient Instructions (Signed)
Breast Tenderness Breast tenderness is a common problem for women of all ages, but may also occur in men. Breast tenderness may range from mild discomfort to severe pain. In women, the pain usually comes and goes with the menstrual cycle, but it can also be constant. Breast tenderness has many possible causes, including hormone changes, infections, and taking certain medicines. You may have tests, such as a mammogram or an ultrasound, to check for any unusual findings. Having breast tenderness usually does not mean that you have breast cancer. Follow these instructions at home: Managing pain and discomfort  If directed, put ice to the painful area. To do this: Put ice in a plastic bag. Place a towel between your skin and the bag. Leave the ice on for 20 minutes, 2-3 times a day. Wear a supportive bra, especially during exercise. You may also want to wear a supportive bra while sleeping if your breasts are very tender. Medicines Take over-the-counter and prescription medicines only as told by your health care provider. If the cause of your pain is infection, you may be prescribed an antibiotic medicine. If you were prescribed an antibiotic, take it as told by your health care provider. Do not stop taking the antibiotic even if you start to feel better. Eating and drinking Your health care provider may recommend that you lessen the amount of fat in your diet. You can do this by: Limiting fried foods. Cooking foods using methods such as baking, boiling, grilling, and broiling. Decrease the amount of caffeine in your diet. Instead, drink more water and choose caffeine-free drinks. General instructions  Keep a log of the days and times when your breasts are most tender. Ask your health care provider how to do breast exams at home. This will help you notice if you have an unusual growth or lump. Keep all follow-up visits as told by your health care provider. This is important. Contact a health care  provider if: Any part of your breast is hard, red, and hot to the touch. This may be a sign of infection. You are a woman and: Not breastfeeding and you have fluid, especially blood or pus, coming out of your nipples. Have a new or painful lump in your breast that remains after your menstrual period ends. You have a fever. Your pain does not improve or it gets worse. Your pain is interfering with your daily activities. Summary Breast tenderness may range from mild discomfort to severe pain. Breast tenderness has many possible causes, including hormone changes, infections, and taking certain medicines. It can be treated with ice, wearing a supportive bra, and medicines. Make changes to your diet if told to by your health care provider. This information is not intended to replace advice given to you by your health care provider. Make sure you discuss any questions you have with your health care provider. Document Revised: 09/02/2018 Document Reviewed: 09/02/2018 Elsevier Patient Education  2022 [employer]  

## 2021-07-20 ENCOUNTER — Encounter: Payer: Self-pay | Admitting: Obstetrics and Gynecology

## 2021-07-21 ENCOUNTER — Encounter: Payer: Self-pay | Admitting: Obstetrics and Gynecology

## 2021-07-21 LAB — CERVICOVAGINAL ANCILLARY ONLY
Bacterial Vaginitis (gardnerella): NEGATIVE
Candida Glabrata: NEGATIVE
Candida Vaginitis: NEGATIVE
Chlamydia: NEGATIVE
Comment: NEGATIVE
Comment: NEGATIVE
Comment: NEGATIVE
Comment: NEGATIVE
Comment: NEGATIVE
Comment: NORMAL
Neisseria Gonorrhea: NEGATIVE
Trichomonas: NEGATIVE

## 2022-03-28 ENCOUNTER — Ambulatory Visit (INDEPENDENT_AMBULATORY_CARE_PROVIDER_SITE_OTHER): Payer: BC Managed Care – PPO

## 2022-03-28 ENCOUNTER — Other Ambulatory Visit (HOSPITAL_COMMUNITY)
Admission: RE | Admit: 2022-03-28 | Discharge: 2022-03-28 | Disposition: A | Discharge status: Home / Self Care | Payer: BC Managed Care – PPO | Source: Ambulatory Visit | Attending: Obstetrics and Gynecology | Admitting: Obstetrics and Gynecology

## 2022-03-28 VITALS — BP 114/80 | HR 91 | Wt 251.1 lb

## 2022-03-28 DIAGNOSIS — Z3201 Encounter for pregnancy test, result positive: Secondary | ICD-10-CM | POA: Diagnosis not present

## 2022-03-28 DIAGNOSIS — N898 Other specified noninflammatory disorders of vagina: Secondary | ICD-10-CM

## 2022-03-28 DIAGNOSIS — N912 Amenorrhea, unspecified: Secondary | ICD-10-CM

## 2022-03-28 LAB — POCT URINE PREGNANCY: Preg Test, Ur: POSITIVE — AB

## 2022-03-28 NOTE — Patient Instructions (Signed)

## 2022-03-28 NOTE — Addendum Note (Signed)
Addended by: Fonda Kinder on: 03/28/2022 03:54 PM   Modules accepted: Orders

## 2022-03-28 NOTE — Progress Notes (Signed)
Subjective:    Brenda Mccoy is a 26 y.o. female who presents for evaluation of amenorrhea. She believes she could be pregnant. Patient is ambivalent about pregnancy.  Last period was normal.Patient does not know date of LMP.   No LMP recorded. The following portions of the patient's history were reviewed and updated as appropriate: allergies, current medications, past family history, past medical history, and problem list.   Lab Review Urine HCG: positive    Assessment:    Absence of menstruation.     Plan:    Pregnancy Test:  Positive: EDC: N/A. Briefly discussed positive results and sent to check out for scheduling for New OB appointments.   Nicholos Johns. [facility]

## 2022-03-30 ENCOUNTER — Encounter: Payer: Self-pay | Admitting: Obstetrics

## 2022-03-30 LAB — CERVICOVAGINAL ANCILLARY ONLY
Bacterial Vaginitis (gardnerella): NEGATIVE
Candida Glabrata: NEGATIVE
Candida Vaginitis: POSITIVE — AB
Chlamydia: NEGATIVE
Comment: NEGATIVE
Comment: NEGATIVE
Comment: NEGATIVE
Comment: NEGATIVE
Comment: NEGATIVE
Comment: NORMAL
Neisseria Gonorrhea: NEGATIVE
Trichomonas: NEGATIVE

## 2022-03-31 ENCOUNTER — Other Ambulatory Visit: Payer: Self-pay | Admitting: Obstetrics

## 2022-03-31 MED ORDER — CLOTRIMAZOLE 1 % VA CREA: 1.0000 | TOPICAL_CREAM | Freq: Every day | VAGINAL | 0 refills | Status: DC

## 2022-03-31 NOTE — Telephone Encounter (Signed)
She has not tried either of those in the past.

## 2022-03-31 NOTE — Telephone Encounter (Signed)
Looks like she's allergic to Miconazole.

## 2022-03-31 NOTE — Telephone Encounter (Signed)
Please advise 

## 2022-04-04 ENCOUNTER — Ambulatory Visit
Admission: RE | Admit: 2022-04-04 | Discharge: 2022-04-04 | Disposition: A | Discharge status: Home / Self Care | Payer: BC Managed Care – PPO | Source: Ambulatory Visit | Attending: Obstetrics | Admitting: Obstetrics

## 2022-04-04 DIAGNOSIS — N912 Amenorrhea, unspecified: Secondary | ICD-10-CM | POA: Insufficient documentation

## 2022-04-08 ENCOUNTER — Encounter: Payer: Self-pay | Admitting: Obstetrics

## 2022-04-08 ENCOUNTER — Other Ambulatory Visit: Payer: Self-pay | Admitting: Obstetrics

## 2022-04-08 DIAGNOSIS — O3680X0 Pregnancy with inconclusive fetal viability, not applicable or unspecified: Secondary | ICD-10-CM

## 2022-04-11 ENCOUNTER — Other Ambulatory Visit: Payer: Self-pay | Admitting: Obstetrics

## 2022-04-11 MED ORDER — ONDANSETRON HCL 4 MG PO TABS: 4.0000 mg | ORAL_TABLET | Freq: Three times a day (TID) | ORAL | 1 refills | Status: DC | PRN

## 2022-04-21 ENCOUNTER — Other Ambulatory Visit: Payer: Self-pay | Admitting: Obstetrics

## 2022-04-21 ENCOUNTER — Ambulatory Visit
Admission: RE | Admit: 2022-04-21 | Discharge: 2022-04-21 | Disposition: A | Discharge status: Home / Self Care | Payer: BC Managed Care – PPO | Source: Ambulatory Visit | Attending: Obstetrics | Admitting: Obstetrics

## 2022-04-21 DIAGNOSIS — O3680X0 Pregnancy with inconclusive fetal viability, not applicable or unspecified: Secondary | ICD-10-CM | POA: Diagnosis present

## 2022-04-23 ENCOUNTER — Encounter: Payer: Self-pay | Admitting: Obstetrics

## 2022-04-24 HISTORY — DX: Maternal care for unspecified type scar from previous cesarean delivery: O34.219

## 2022-04-24 NOTE — L&D Delivery Note (Signed)
Delivery Summary for [employer]  Labor Events:   Preterm labor: No data found  Rupture date: No data found  Rupture time: No data found  Rupture type: No data found  Fluid Color: Clear  Induction: No data found  Augmentation: No data found  Complications: No data found  Cervical ripening: No data found No data found   No data found     Delivery:   Episiotomy: No data found  Lacerations: No data found  Repair suture: No data found  Repair # of packets: No data found  Blood loss (ml): No data found   Information for the patient's newborn:  [place], Gallego [[id number]]   Delivery 11/29/2022 10:10 AM by  C-Section, Low Transverse Sex:  female Gestational Age: [redacted]w[redacted]d Delivery Clinician:   Living?:         APGARS  One minute Five minutes Ten minutes  Skin color:        Heart rate:        Grimace:        Muscle tone:        Breathing:        Totals: 8  9      Presentation/position:      Resuscitation:   Cord information:    Disposition of cord blood:     Blood gases sent?  Complications:   Placenta: Delivered:       appearance Newborn Measurements: Weight: 8 lb 0.8 oz (3650 g)  Height: 21.38"  Head circumference:    Chest circumference:    Other providers:    Additional  information: Forceps:   Vacuum:   Breech:   Observed anomalies skin tag medial to R nipple       See. Dr. Oretha Milch operative note for details of C-section procedure.     Hildred Laser, MD [facility]

## 2022-05-22 IMAGING — CR DG CHEST 2V
2 series · 2 of 2 positions shown · non-contrast
Comparison: None.

CLINICAL DATA: 24-year-old female with chest pain for 1 day status
post C-section.

EXAM:
CHEST - 2 VIEW

[chest pa]
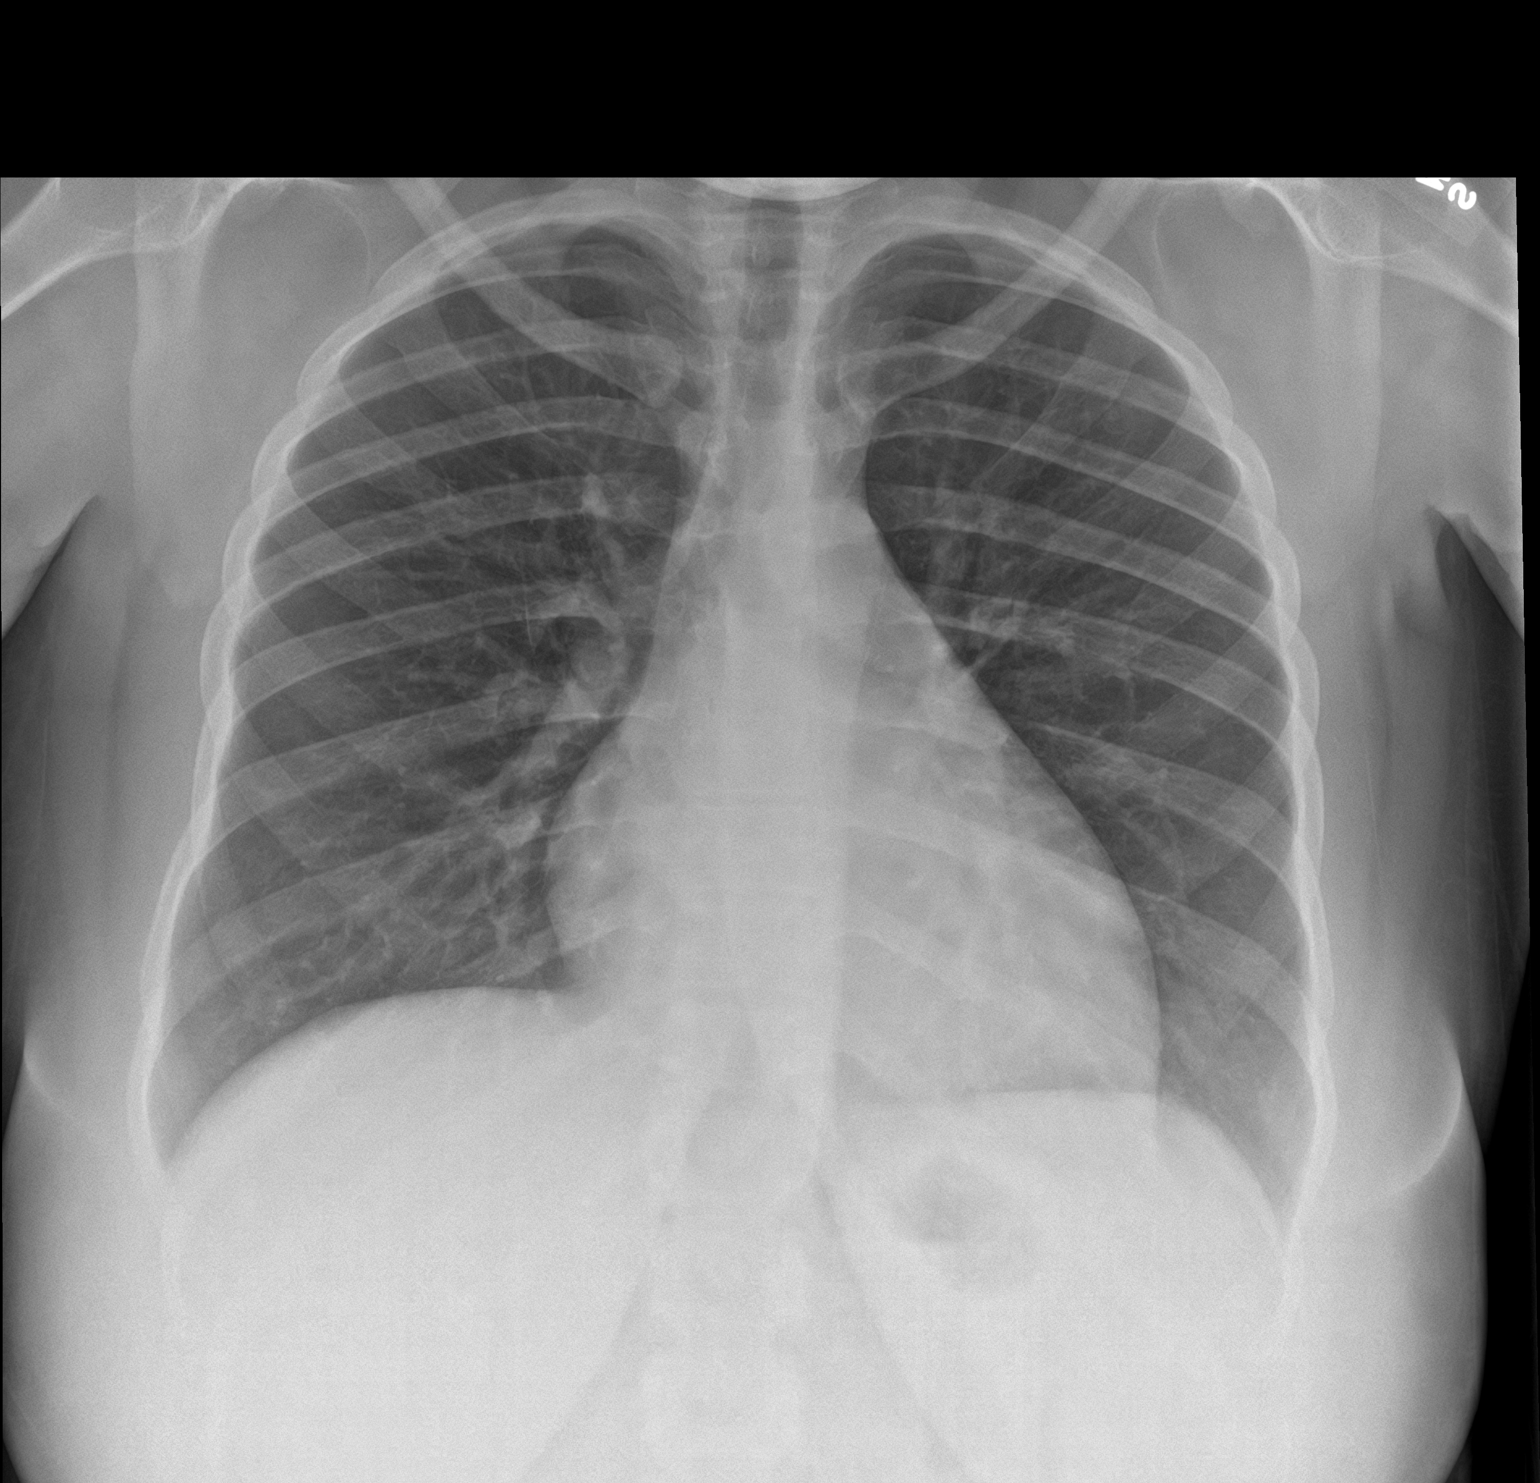

[chest lat]
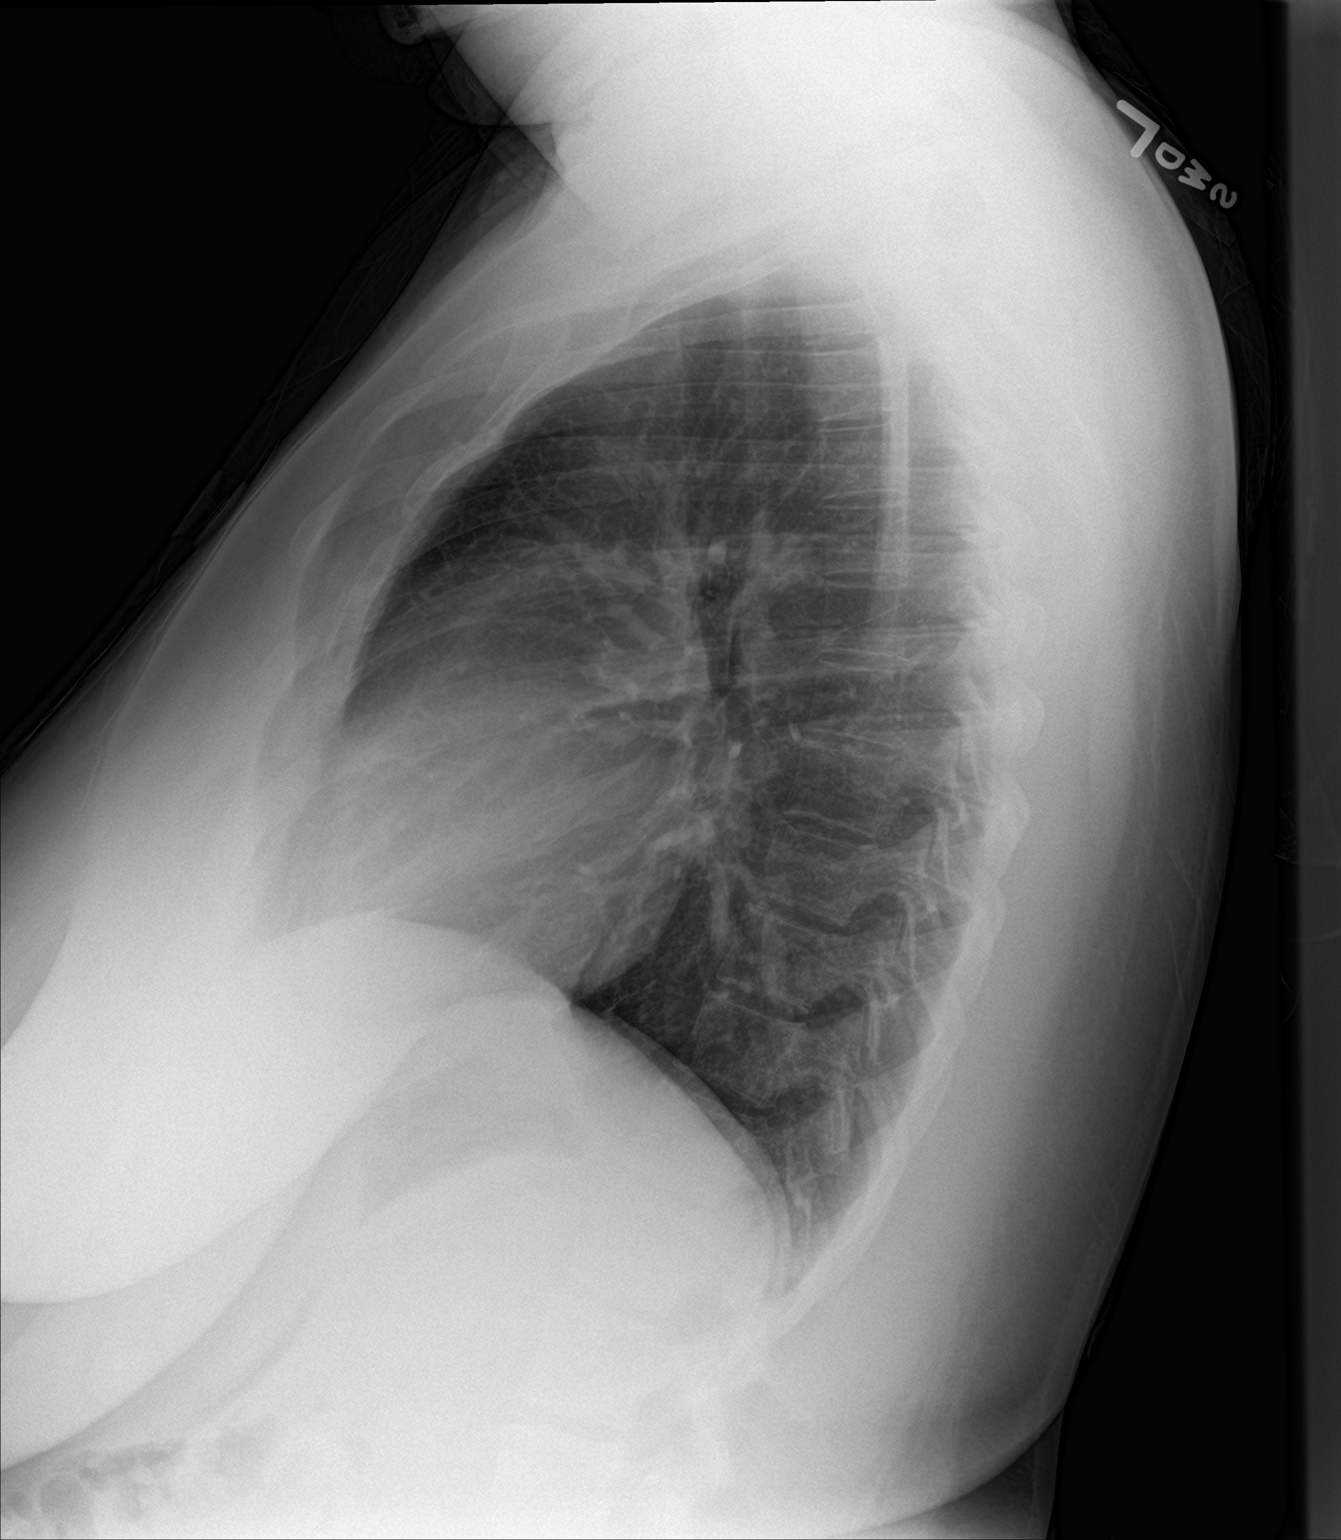

[2 of 2 positions shown; findings below may reference images not displayed]

FINDINGS: Normal lung volumes and mediastinal contours. Visualized tracheal
air column is within normal limits. Both lungs are clear. No
pneumothorax or pleural effusion.

No osseous abnormality identified. Negative visible bowel gas
pattern.
IMPRESSION: Negative.  No cardiopulmonary abnormality.

## 2022-05-26 ENCOUNTER — Ambulatory Visit (INDEPENDENT_AMBULATORY_CARE_PROVIDER_SITE_OTHER): Payer: BC Managed Care – PPO

## 2022-05-26 VITALS — Wt 250.0 lb

## 2022-05-26 DIAGNOSIS — Z3689 Encounter for other specified antenatal screening: Secondary | ICD-10-CM

## 2022-05-26 DIAGNOSIS — Z131 Encounter for screening for diabetes mellitus: Secondary | ICD-10-CM

## 2022-05-26 DIAGNOSIS — Z369 Encounter for antenatal screening, unspecified: Secondary | ICD-10-CM

## 2022-05-26 DIAGNOSIS — Z3480 Encounter for supervision of other normal pregnancy, unspecified trimester: Secondary | ICD-10-CM | POA: Insufficient documentation

## 2022-05-26 NOTE — Patient Instructions (Signed)
Second Trimester of Pregnancy  The second trimester of pregnancy is from week 13 through week 27. This is months 4 through 6 of pregnancy. The second trimester is often a time when you feel your best. Your body has adjusted to being pregnant, and you begin to feel better physically. During the second trimester: Morning sickness has lessened or stopped completely. You may have more energy. You may have an increase in appetite. The second trimester is also a time when the unborn baby (fetus) is growing rapidly. At the end of the sixth month, the fetus may be up to 12 inches long and weigh about 1 pounds. You will likely begin to feel the baby move (quickening) between 16 and 20 weeks of pregnancy. Body changes during your second trimester Your body continues to go through many changes during your second trimester. The changes vary and generally return to normal after the baby is born. Physical changes Your weight will continue to increase. You will notice your lower abdomen bulging out. You may begin to get stretch marks on your hips, abdomen, and breasts. Your breasts will continue to grow and to become tender. Dark spots or blotches (chloasma or mask of pregnancy) may develop on your face. A dark line from your belly button to the pubic area (linea nigra) may appear. You may have changes in your hair. These can include thickening of your hair, rapid growth, and changes in texture. Some people also have hair loss during or after pregnancy, or hair that feels dry or thin. Health changes You may develop headaches. You may have heartburn. You may develop constipation. You may develop hemorrhoids or swollen, bulging veins (varicose veins). Your gums may bleed and may be sensitive to brushing and flossing. You may urinate more often because the fetus is pressing on your bladder. You may have back pain. This is caused by: Weight gain. Pregnancy hormones that are relaxing the joints in your  pelvis. A shift in weight and the muscles that support your balance. Follow these instructions at home: Medicines Follow your health care provider's instructions regarding medicine use. Specific medicines may be either safe or unsafe to take during pregnancy. Do not take any medicines unless approved by your health care provider. Take a prenatal vitamin that contains at least 600 micrograms (mcg) of folic acid. Eating and drinking Eat a healthy diet that includes fresh fruits and vegetables, whole grains, good sources of protein such as meat, eggs, or tofu, and low-fat dairy products. Avoid raw meat and unpasteurized juice, milk, and cheese. These carry germs that can harm you and your baby. You may need to take these actions to prevent or treat constipation: Drink enough fluid to keep your urine pale yellow. Eat foods that are high in fiber, such as beans, whole grains, and fresh fruits and vegetables. Limit foods that are high in fat and processed sugars, such as fried or sweet foods. Activity Exercise only as directed by your health care provider. Most people can continue their usual exercise routine during pregnancy. Try to exercise for 30 minutes at least 5 days a week. Stop exercising if you develop contractions in your uterus. Stop exercising if you develop pain or cramping in the lower abdomen or lower back. Avoid exercising if it is very hot or humid or if you are at a high altitude. Avoid heavy lifting. If you choose to, you may have sex unless your health care provider tells you not to. Relieving pain and discomfort Wear a supportive  bra to prevent discomfort from breast tenderness. Take warm sitz baths to soothe any pain or discomfort caused by hemorrhoids. Use hemorrhoid cream if your health care provider approves. Rest with your legs raised (elevated) if you have leg cramps or low back pain. If you develop varicose veins: Wear support hose as told by your health care  provider. Elevate your feet for 15 minutes, 3-4 times a day. Limit salt in your diet. Safety Wear your seat belt at all times when driving or riding in a car. Talk with your health care provider if someone is verbally or physically abusive to you. Lifestyle Do not use hot tubs, steam rooms, or saunas. Do not douche. Do not use tampons or scented sanitary pads. Avoid cat litter boxes and soil used by cats. These carry germs that can cause birth defects in the baby and possibly loss of the fetus by miscarriage or stillbirth. Do not use herbal remedies, alcohol, illegal drugs, or medicines that are not approved by your health care provider. Chemicals in these products can harm your baby. Do not use any products that contain nicotine or tobacco, such as cigarettes, e-cigarettes, and chewing tobacco. If you need help quitting, ask your health care provider. General instructions During a routine prenatal visit, your health care provider will do a physical exam and other tests. He or she will also discuss your overall health. Keep all follow-up visits. This is important. Ask your health care provider for a referral to a local prenatal education class. Ask for help if you have counseling or nutritional needs during pregnancy. Your health care provider can offer advice or refer you to specialists for help with various needs. Where to find more information American Pregnancy Association: americanpregnancy.[place] and Gynecologists: PoolDevices.com.pt Office on [employer] Health: KeywordPortfolios.com.br Contact a health care provider if you have: A headache that does not go away when you take medicine. Vision changes or you see spots in front of your eyes. Mild pelvic cramps, pelvic pressure, or nagging pain in the abdominal area. Persistent nausea, vomiting, or diarrhea. A bad-smelling vaginal discharge or foul-smelling urine. Pain when you  urinate. Sudden or extreme swelling of your face, hands, ankles, feet, or legs. A fever. Get help right away if you: Have fluid leaking from your vagina. Have spotting or bleeding from your vagina. Have severe abdominal cramping or pain. Have difficulty breathing. Have chest pain. Have fainting spells. Have not felt your baby move for the time period told by your health care provider. Have new or increased pain, swelling, or redness in an arm or leg. Summary The second trimester of pregnancy is from week 13 through week 27 (months 4 through 6). Do not use herbal remedies, alcohol, illegal drugs, or medicines that are not approved by your health care provider. Chemicals in these products can harm your baby. Exercise only as directed by your health care provider. Most people can continue their usual exercise routine during pregnancy. Keep all follow-up visits. This is important. This information is not intended to replace advice given to you by your health care provider. Make sure you discuss any questions you have with your health care provider. Document Revised: 09/17/2019 Document Reviewed: 07/24/2019 Elsevier Patient Education  [place] of Pregnancy  The first trimester of pregnancy starts on the first day of your last menstrual period until the end of week 12. This is also called months 1 through 3 of pregnancy. Body changes during your first trimester Your  body goes through many changes during pregnancy. The changes usually return to normal after your baby is born. Physical changes You may gain or lose weight. Your breasts may grow larger and hurt. The area around your nipples may get darker. Dark spots or blotches may develop on your face. You may have changes in your hair. Health changes You may feel like you might vomit (nauseous), and you may vomit. You may have heartburn. You may have headaches. You may have trouble pooping (constipation). Your  gums may bleed. Other changes You may get tired easily. You may pee (urinate) more often. Your menstrual periods will stop. You may not feel hungry. You may want to eat certain kinds of food. You may have changes in your emotions from day to day. You may have more dreams. Follow these instructions at home: Medicines Take over-the-counter and prescription medicines only as told by your doctor. Some medicines are not safe during pregnancy. Take a prenatal vitamin that contains at least 600 micrograms (mcg) of folic acid. Eating and drinking Eat healthy meals that include: Fresh fruits and vegetables. Whole grains. Good sources of protein, such as meat, eggs, or tofu. Low-fat dairy products. Avoid raw meat and unpasteurized juice, milk, and cheese. If you feel like you may vomit, or you vomit: Eat 4 or 5 small meals a day instead of 3 large meals. Try eating a few soda crackers. Drink liquids between meals instead of during meals. You may need to take these actions to prevent or treat trouble pooping: Drink enough fluids to keep your pee (urine) pale yellow. Eat foods that are high in fiber. These include beans, whole grains, and fresh fruits and vegetables. Limit foods that are high in fat and sugar. These include fried or sweet foods. Activity Exercise only as told by your doctor. Most people can do their usual exercise routine during pregnancy. Stop exercising if you have cramps or pain in your lower belly (abdomen) or low back. Do not exercise if it is too hot or too humid, or if you are in a place of great height (high altitude). Avoid heavy lifting. If you choose to, you may have sex unless your doctor tells you not to. Relieving pain and discomfort Wear a good support bra if your breasts are sore. Rest with your legs raised (elevated) if you have leg cramps or low back pain. If you have bulging veins (varicose veins) in your legs: Wear support hose as told by your  doctor. Raise your feet for 15 minutes, 3-4 times a day. Limit salt in your food. Safety Wear your seat belt at all times when you are in a car. Talk with your doctor if someone is hurting you or yelling at you. Talk with your doctor if you are feeling sad or have thoughts of hurting yourself. Lifestyle Do not use hot tubs, steam rooms, or saunas. Do not douche. Do not use tampons or scented sanitary pads. Do not use herbal medicines, illegal drugs, or medicines that are not approved by your doctor. Do not drink alcohol. Do not smoke or use any products that contain nicotine or tobacco. If you need help quitting, ask your doctor. Avoid cat litter boxes and soil that is used by cats. These carry germs that can cause harm to the baby and can cause a loss of your baby by miscarriage or stillbirth. General instructions Keep all follow-up visits. This is important. Ask for help if you need counseling or if you need help with  nutrition. Your doctor can give you advice or tell you where to go for help. Visit your dentist. At home, brush your teeth with a soft toothbrush. Floss gently. Write down your questions. Take them to your prenatal visits. Where to find more information American Pregnancy Association: americanpregnancy.org [employer] of Obstetricians and Gynecologists: www.acog.org Office on Women's Health: KeywordPortfolios.com.br Contact a doctor if: You are dizzy. You have a fever. You have mild cramps or pressure in your lower belly. You have a nagging pain in your belly area. You continue to feel like you may vomit, you vomit, or you have watery poop (diarrhea) for 24 hours or longer. You have a bad-smelling fluid coming from your vagina. You have pain when you pee. You are exposed to a disease that spreads from person to person, such as chickenpox, measles, Zika virus, HIV, or hepatitis. Get help right away if: You have spotting or bleeding from your vagina. You have  very bad belly cramping or pain. You have shortness of breath or chest pain. You have any kind of injury, such as from a fall or a car crash. You have new or increased pain, swelling, or redness in an arm or leg. Summary The first trimester of pregnancy starts on the first day of your last menstrual period until the end of week 12 (months 1 through 3). Eat 4 or 5 small meals a day instead of 3 large meals. Do not smoke or use any products that contain nicotine or tobacco. If you need help quitting, ask your doctor. Keep all follow-up visits. This information is not intended to replace advice given to you by your health care provider. Make sure you discuss any questions you have with your health care provider. Document Revised: 09/17/2019 Document Reviewed: 07/24/2019 Elsevier Patient Education  [place]. Commonly Asked Questions During Pregnancy  Cats: A parasite can be excreted in cat feces.  To avoid exposure you need to have another person empty the little box.  If you must empty the litter box you will need to wear gloves.  Wash your hands after handling your cat.  This parasite can also be found in raw or undercooked meat so this should also be avoided.  Colds, Sore Throats, Flu: Please check your medication sheet to see what you can take for symptoms.  If your symptoms are unrelieved by these medications please call the office.  Dental Work: Most any dental work Investment banker, corporate recommends is permitted.  X-rays should only be taken during the first trimester if absolutely necessary.  Your abdomen should be shielded with a lead apron during all x-rays.  Please notify your provider prior to receiving any x-rays.  Novocaine is fine; gas is not recommended.  If your dentist requires a note from [place] prior to dental work please call the office and we will provide one for you.  Exercise: Exercise is an important part of staying healthy during your pregnancy.  You may continue most exercises  you were accustomed to prior to pregnancy.  Later in your pregnancy you will most likely notice you have difficulty with activities requiring balance like riding a bicycle.  It is important that you listen to your body and avoid activities that put you at a higher risk of falling.  Adequate rest and staying well hydrated are a must!  If you have questions about the safety of specific activities ask your provider.    Exposure to Children with illness: Try to avoid obvious exposure; report any  symptoms to us when noted,  If you have chicken pos, red measles or mumps, you should be immune to these diseases.   Please do not take any vaccines while pregnant unless you have checked with your OB provider.  Fetal Movement: After 28 weeks we recommend you do "kick counts" twice daily.  Lie or sit down in a calm quiet environment and count your baby movements "kicks".  You should feel your baby at least 10 times per hour.  If you have not felt 10 kicks within the first hour get up, walk around and have something sweet to eat or drink then repeat for an additional hour.  If count remains less than 10 per hour notify your provider.  Fumigating: Follow your pest control agent's advice as to how long to stay out of your home.  Ventilate the area well before re-entering.  Hemorrhoids:   Most over-the-counter preparations can be used during pregnancy.  Check your medication to see what is safe to use.  It is important to use a stool softener or fiber in your diet and to drink lots of liquids.  If hemorrhoids seem to be getting worse please call the office.   Hot Tubs:  Hot tubs Jacuzzis and saunas are not recommended while pregnant.  These increase your internal body temperature and should be avoided.  Intercourse:  Sexual intercourse is safe during pregnancy as long as you are comfortable, unless otherwise advised by your provider.  Spotting may occur after intercourse; report any bright red bleeding that is heavier  than spotting.  Labor:  If you know that you are in labor, please go to the hospital.  If you are unsure, please call the office and let us help you decide what to do.  Lifting, straining, etc:  If your job requires heavy lifting or straining please check with your provider for any limitations.  Generally, you should not lift items heavier than that you can lift simply with your hands and arms (no back muscles)  Painting:  Paint fumes do not harm your pregnancy, but may make you ill and should be avoided if possible.  Latex or water based paints have less odor than oils.  Use adequate ventilation while painting.  Permanents & Hair Color:  Chemicals in hair dyes are not recommended as they cause increase hair dryness which can increase hair loss during pregnancy.  " Highlighting" and permanents are allowed.  Dye may be absorbed differently and permanents may not hold as well during pregnancy.  Sunbathing:  Use a sunscreen, as skin burns easily during pregnancy.  Drink plenty of fluids; avoid over heating.  Tanning Beds:  Because their possible side effects are still unknown, tanning beds are not recommended.  Ultrasound Scans:  Routine ultrasounds are performed at approximately 20 weeks.  You will be able to see your baby's general anatomy an if you would like to know the gender this can usually be determined as well.  If it is questionable when you conceived you may also receive an ultrasound early in your pregnancy for dating purposes.  Otherwise ultrasound exams are not routinely performed unless there is a medical necessity.  Although you can request a scan we ask that you pay for it when conducted because insurance does not cover " patient request" scans.  Work: If your pregnancy proceeds without complications you may work until your due date, unless your physician or employer advises otherwise.  Round Ligament Pain/Pelvic Discomfort:  Sharp, shooting pains not associated   with bleeding are  fairly common, usually occurring in the second trimester of pregnancy.  They tend to be worse when standing up or when you remain standing for long periods of time.  These are the result of pressure of certain pelvic ligaments called "round ligaments".  Rest, Tylenol and heat seem to be the most effective relief.  As the womb and fetus grow, they rise out of the pelvis and the discomfort improves.  Please notify the office if your pain seems different than that described.  It may represent a more serious condition.  Common Medications Safe in Pregnancy  Acne:      Constipation:  Benzoyl Peroxide     Colace  Clindamycin      Dulcolax Suppository  Topica Erythromycin     Fibercon  Salicylic Acid      Metamucil         Miralax AVOID:        Senakot   Accutane    Cough:  Retin-A       Cough Drops  Tetracycline      Phenergan w/ Codeine if Rx  Minocycline      Robitussin (Plain & DM)  Antibiotics:     Crabs/Lice:  Ceclor       RID  Cephalosporins    AVOID:  E-Mycins      Kwell  Keflex  Macrobid/Macrodantin   Diarrhea:  Penicillin      Kao-Pectate  Zithromax      Imodium AD         PUSH FLUIDS AVOID:       Cipro     Fever:  Tetracycline      Tylenol (Regular or Extra  Minocycline       Strength)  Levaquin      Extra Strength-Do not          Exceed 8 tabs/24 hrs Caffeine:        200mg /day (equiv. To 1 cup of coffee or  approx. 3 12 oz sodas)         Gas: Cold/Hayfever:       Gas-X  Benadryl      Mylicon  Claritin       Phazyme  **Claritin-D        Chlor-Trimeton    Headaches:  Dimetapp      ASA-Free Excedrin  Drixoral-Non-Drowsy     Cold Compress  Mucinex (Guaifenasin)     Tylenol (Regular or Extra  Sudafed/Sudafed-12 Hour     Strength)  **Sudafed PE Pseudoephedrine   Tylenol Cold & Sinus     Vicks Vapor Rub  Zyrtec  **AVOID if Problems With Blood Pressure         Heartburn: Avoid lying down for at least 1 hour after meals  Aciphex      Maalox     Rash:  Milk of  Magnesia     Benadryl    Mylanta       1% Hydrocortisone Cream  Pepcid  Pepcid Complete   Sleep Aids:  Prevacid      Ambien   Prilosec       Benadryl  Rolaids       Chamomile Tea  Tums (Limit 4/day)     Unisom         Tylenol PM         Warm milk-add vanilla or  Hemorrhoids:       Sugar for taste  Anusol/Anusol H.C.  (RX: Analapram 2.5%)  Sugar Substitutes:  Hydrocortisone OTC     Ok in moderation  Preparation H      Tucks        Vaseline lotion applied to tissue with wiping    Herpes:     Throat:  Acyclovir      Oragel  Famvir  Valtrex     Vaccines:         Flu Shot Leg Cramps:       *Gardasil  Benadryl      Hepatitis A         Hepatitis B Nasal Spray:       Pneumovax  Saline Nasal Spray     Polio Booster         Tetanus Nausea:       Tuberculosis test or PPD  Vitamin B6 25 mg TID   AVOID:    Dramamine      *Gardasil  Emetrol       Live Poliovirus  Ginger Root 250 mg QID    MMR (measles, mumps &  High Complex Carbs @ Bedtime    rebella)  Sea Bands-Accupressure    Varicella (Chickenpox)  Unisom 1/2 tab TID     *No known complications           If received before Pain:         Known pregnancy;   Darvocet       Resume series after  Lortab        Delivery  Percocet    Yeast:   Tramadol      Femstat  Tylenol 3      Gyne-lotrimin  Ultram       Monistat  Vicodin           MISC:         All Sunscreens           Hair Coloring/highlights          Insect Repellant's          (Including DEET)         Mystic Tans

## 2022-05-26 NOTE — Progress Notes (Signed)
New OB Intake  I connected with  Brenda Mccoy on 05/26/22 at  3:15 PM EST by telephone and verified that I am speaking with the correct person using two identifiers. Nurse is located at [employer] and pt is located at work.  I explained I am completing New OB Intake today. We discussed her EDD of 11/29/2022 that is based on u/s 04/21/22 of [redacted]w[redacted]d. Pt is G2/P1001. I reviewed her allergies, medications, Medical/Surgical/OB history, and appropriate screenings. There are no cats in the home.  Based on history, this is a/an pregnancy uncomplicated .   Patient Active Problem List   Diagnosis Date Noted   Supervision of other normal pregnancy, antepartum 05/26/2022   Postpartum hemorrhage 09/06/2020   Cesarean delivery delivered    Non-reassuring fetal heart rate with late deceleration    History of premature rupture of membranes 09/04/2020   COVID-19 09/03/2020   Prediabetes 02/16/2020   Type B blood, Rh positive 02/16/2020   STD exposure 12/18/2017   Yeast vaginitis 12/01/2017   Acute vaginitis 09/04/2017   Encounter for routine child health examination without abnormal findings 01/06/2016    Concerns addressed today Interested in Breastfeeding classes   Delivery Plans:  Plans to deliver at [facility] if she continues her care with [place].  Is going with [place] for now b/c [facility] couldn't see her until 2/27th.  Anatomy US Explained first scheduled [place] will be around 20 weeks.  Labs Discussed  genetic screening with patient. Patient desires genetic testing to be drawn with new OB labs. Discussed possible labs to be drawn at new OB appointment.  COVID Vaccine Patient has not had COVID vaccine.   Social Determinants of Health Food Insecurity: denies food insecurity Transportation: Patient denies transportation needs. Childcare: Discussed no children allowed at ultrasound appointments.   First visit review I reviewed new OB appt with pt. I explained she will have ob  bloodwork and pap smear/pelvic exam if indicated. Explained pt will be seen by Lloyd Huger, CNM at first visit; encounter routed to appropriate provider.   Cleophas Dunker, [facility] 05/26/2022  3:46 PM

## 2022-05-29 ENCOUNTER — Other Ambulatory Visit (HOSPITAL_COMMUNITY)
Admission: RE | Admit: 2022-05-29 | Discharge: 2022-05-29 | Disposition: A | Discharge status: Home / Self Care | Payer: BC Managed Care – PPO | Source: Ambulatory Visit | Attending: Obstetrics | Admitting: Obstetrics

## 2022-05-29 ENCOUNTER — Ambulatory Visit (INDEPENDENT_AMBULATORY_CARE_PROVIDER_SITE_OTHER): Payer: Medicaid Other | Admitting: Obstetrics

## 2022-05-29 ENCOUNTER — Other Ambulatory Visit: Payer: Self-pay

## 2022-05-29 VITALS — BP 119/75 | HR 87 | Wt 244.7 lb

## 2022-05-29 DIAGNOSIS — Z131 Encounter for screening for diabetes mellitus: Secondary | ICD-10-CM

## 2022-05-29 DIAGNOSIS — Z3A13 13 weeks gestation of pregnancy: Secondary | ICD-10-CM

## 2022-05-29 DIAGNOSIS — Z3480 Encounter for supervision of other normal pregnancy, unspecified trimester: Secondary | ICD-10-CM | POA: Insufficient documentation

## 2022-05-29 DIAGNOSIS — Z369 Encounter for antenatal screening, unspecified: Secondary | ICD-10-CM | POA: Insufficient documentation

## 2022-05-29 DIAGNOSIS — O0992 Supervision of high risk pregnancy, unspecified, second trimester: Secondary | ICD-10-CM

## 2022-05-29 DIAGNOSIS — O0991 Supervision of high risk pregnancy, unspecified, first trimester: Secondary | ICD-10-CM

## 2022-05-29 NOTE — Progress Notes (Signed)
NEW OB HISTORY AND PHYSICAL  SUBJECTIVE:       Darrielle Pflieger is a 27 y.o. G11P1001 female, No LMP recorded (lmp unknown). Patient is pregnant., Estimated Date of Delivery: 11/29/22, [redacted]w[redacted]d, presents today for establishment of Prenatal Care. She reports nausea with vomiting 1-2x/week, backache, fatigue, and breast tenderness. She has a h/o CS for fetal intolerance. She plans a VBAC for this birth.  Social history Partner/Relationship: FOB involved Living situation: lives with partner, Nathaneil Canary, and child Work: Editor, commissioning for [employer] Exercise: none Substance use: denies EtOH, vape, tobacco, and recreational drugs   Gynecologic History No LMP recorded (lmp unknown). Patient is pregnant. Normal Contraception: none Last Pap: 10/13/19. Results were: normal  Obstetric History OB History  Gravida Para Term Preterm AB Living  2 1 1     1   SAB IAB Ectopic Multiple Live Births        0 1    # Outcome Date GA Lbr Len/2nd Weight Sex Delivery Anes PTL Lv  2 Current           1 Term 09/05/20 [redacted]w[redacted]d  6 lb 10.2 oz (3.01 kg) M CS-LTranv Spinal, EPI  LIV    Past Medical History:  Diagnosis Date   Anemia     Past Surgical History:  Procedure Laterality Date   CESAREAN SECTION  09/05/2020   Procedure: CESAREAN SECTION;  Surgeon: Rubie Maid, MD;  Location: [facility] ORS;  Service: Obstetrics;;   ROOT CANAL     WISDOM TOOTH EXTRACTION     1    Current Outpatient Medications on File Prior to Visit  Medication Sig Dispense Refill   prenatal vitamin w/FE, FA (NATACHEW) 29-1 MG CHEW chewable tablet Chew 2 tablets by mouth daily at 12 noon.     clotrimazole (GYNE-LOTRIMIN) 1 % vaginal cream Place 1 Applicatorful vaginally at bedtime. (Patient not taking: Reported on 05/26/2022) 45 g 0   ondansetron (ZOFRAN) 4 MG tablet Take 1 tablet (4 mg total) by mouth every 8 (eight) hours as needed for nausea or vomiting. (Patient not taking: Reported on 05/29/2022) 20 tablet 1   No current facility-administered  medications on file prior to visit.    Allergies  Allergen Reactions   Miconazole Itching    Social History   Socioeconomic History   Marital status: Single    Spouse name: Not on file   Number of children: 1   Years of education: 16   Highest education level: Not on file  Occupational History   Occupation: tech at [employer]  Tobacco Use   Smoking status: Never   Smokeless tobacco: Never  Vaping Use   Vaping Use: Never used  Substance and Sexual Activity   Alcohol use: Not Currently   Drug use: Never   Sexual activity: Yes    Partners: Male    Birth control/protection: None  Other Topics Concern   Not on file  Social History Narrative   Not on file   Social Determinants of Health   Financial Resource Strain: Low Risk  (05/26/2022)   Overall Financial Resource Strain (CARDIA)    Difficulty of Paying Living Expenses: Not very hard  Food Insecurity: No Food Insecurity (05/26/2022)   Hunger Vital Sign    Worried About Running Out of Food in the Last Year: Never true    Ran Out of Food in the Last Year: Never true  Transportation Needs: No Transportation Needs (05/26/2022)   PRAPARE - Hydrologist (Medical): No  Lack of Transportation (Non-Medical): No  Physical Activity: Inactive (05/26/2022)   Exercise Vital Sign    Days of Exercise per Week: 0 days    Minutes of Exercise per Session: 0 min  Stress: No Stress Concern Present (05/26/2022)   [place]    Feeling of Stress : Not at all  Social Connections: Moderately Integrated (05/26/2022)   Social Connection and Isolation Panel [NHANES]    Frequency of Communication with Friends and Family: More than three times a week    Frequency of Social Gatherings with Friends and Family: Once a week    Attends Religious Services: More than 4 times per year    Active Member of [employer] or Organizations: No    Attends Archivist Meetings:  Never    Marital Status: Living with partner  Intimate Partner Violence: Not At Risk (05/26/2022)   Humiliation, Afraid, Rape, and Kick questionnaire    Fear of Current or Ex-Partner: No    Emotionally Abused: No    Physically Abused: No    Sexually Abused: No    Family History  Problem Relation Age of Onset   Hypertension Mother    Healthy Father    Other Sister        HPV   Healthy Sister    Healthy Sister    Healthy Sister    Healthy Brother    Healthy Brother    Healthy Brother    Healthy Maternal Grandmother    Hypertension Maternal Grandfather    Cancer Paternal Grandmother    Healthy Paternal Grandmother     The following portions of the patient's history were reviewed and updated as appropriate: allergies, current medications, past OB history, past medical history, past surgical history, past family history, past social history, and problem list.  Constitutional: Denied constitutional symptoms, night sweats, recent illness, fatigue, fever, insomnia and weight loss.  Eyes: Denied eye symptoms, eye pain, photophobia, vision change and visual disturbance.  Ears/Nose/Throat/Neck: Denied ear, nose, throat or neck symptoms, hearing loss, nasal discharge, sinus congestion and sore throat.  Cardiovascular: Denied cardiovascular symptoms, arrhythmia, chest pain/pressure, edema, exercise intolerance, orthopnea and palpitations.  Respiratory: Denied pulmonary symptoms, asthma, pleuritic pain, productive sputum, cough, dyspnea and wheezing.  Gastrointestinal: Denied, gastro-esophageal reflux, melena. +nausea and vomiting.  Genitourinary: Denied genitourinary symptoms including symptomatic vaginal discharge, pelvic relaxation issues, and urinary complaints.  Musculoskeletal: Denied musculoskeletal symptoms, stiffness, swelling, muscle weakness and myalgia.  Dermatologic: Denied dermatology symptoms, rash and scar.  Neurologic: Denied neurology symptoms, dizziness, headache, neck pain  and syncope.  Psychiatric: Denied psychiatric symptoms, anxiety and depression.  Endocrine: Denied endocrine symptoms including hot flashes and night sweats.     OBJECTIVE: Initial Physical Exam (New OB)  GENERAL APPEARANCE: alert, well appearing HEAD: normocephalic, atraumatic MOUTH: mucous membranes moist, pharynx normal without lesions THYROID: no thyromegaly or masses present BREASTS: no masses noted, no significant tenderness, no palpable axillary nodes, no skin changes LUNGS: clear to auscultation, no wheezes, rales or rhonchi, symmetric air entry HEART: regular rate and rhythm, no murmurs ABDOMEN: soft, nontender, nondistended, no abnormal masses, no epigastric pain and FHT present (155) EXTREMITIES: no redness or tenderness in the calves or thighs SKIN: normal coloration and turgor, no rashes LYMPH NODES: no adenopathy palpable NEUROLOGIC: alert, oriented, normal speech, no focal findings or movement disorder noted  PELVIC EXAM declined  ASSESSMENT: Normal pregnancy [redacted]w[redacted]d  PLAN: Routine prenatal care. We discussed an overview of prenatal care and when to  call. Reviewed diet, exercise, and weight gain recommendations in pregnancy. Reviewed antenatal testing recommended for BMI>40. Discussed benefits of breastfeeding and lactation resources at [facility]. I reviewed labs and answered all questions.  See orders  Lloyd Huger, CNM

## 2022-05-30 LAB — MICROSCOPIC EXAMINATION
Casts: NONE SEEN /lpf
Epithelial Cells (non renal): >10 /hpf — AB (ref 0–10)

## 2022-05-30 LAB — URINALYSIS, ROUTINE W REFLEX MICROSCOPIC
Bilirubin, UA: NEGATIVE
Glucose, UA: NEGATIVE
Nitrite, UA: NEGATIVE
RBC, UA: NEGATIVE
Specific Gravity, UA: >=1.03 — AB (ref 1.005–1.030)
Urobilinogen, Ur: 0.2 mg/dL (ref 0.2–1.0)
pH, UA: 6.5 (ref 5.0–7.5)

## 2022-05-30 LAB — CBC/D/PLT+RPR+RH+ABO+RUBIGG...
Antibody Screen: NEGATIVE
Basophils Absolute: 0 10*3/uL (ref 0.0–0.2)
Basos: 0 %
EOS (ABSOLUTE): 0.2 10*3/uL (ref 0.0–0.4)
Eos: 2 %
HCV Ab: NONREACTIVE
HIV Screen 4th Generation wRfx: NONREACTIVE
Hematocrit: 35.6 % (ref 34.0–46.6)
Hemoglobin: 12.2 g/dL (ref 11.1–15.9)
Hepatitis B Surface Ag: NEGATIVE
Immature Grans (Abs): 0 10*3/uL (ref 0.0–0.1)
Immature Granulocytes: 0 %
Lymphocytes Absolute: 1.6 10*3/uL (ref 0.7–3.1)
Lymphs: 23 %
MCH: 29 pg (ref 26.6–33.0)
MCHC: 34.3 g/dL (ref 31.5–35.7)
MCV: 85 fL (ref 79–97)
Monocytes Absolute: 0.5 10*3/uL (ref 0.1–0.9)
Monocytes: 7 %
Neutrophils Absolute: 4.5 10*3/uL (ref 1.4–7.0)
Neutrophils: 68 %
Platelets: 232 10*3/uL (ref 150–450)
RBC: 4.2 x10E6/uL (ref 3.77–5.28)
RDW: 12.7 % (ref 11.7–15.4)
RPR Ser Ql: NONREACTIVE
Rh Factor: POSITIVE
Rubella Antibodies, IGG: 13.6 index (ref >0.99)
Varicella zoster IgG: 542 index (ref >165)
WBC: 6.7 10*3/uL (ref 3.4–10.8)

## 2022-05-30 LAB — HCV INTERPRETATION

## 2022-05-31 LAB — NICOTINE SCREEN, URINE: Cotinine Ql Scrn, Ur: NEGATIVE ng/mL

## 2022-05-31 LAB — MONITOR DRUG PROFILE 14(MW)
Amphetamine Scrn, Ur: NEGATIVE ng/mL
BARBITURATE SCREEN URINE: NEGATIVE ng/mL
BENZODIAZEPINE SCREEN, URINE: NEGATIVE ng/mL
Buprenorphine, Urine: NEGATIVE ng/mL
CANNABINOIDS UR QL SCN: NEGATIVE ng/mL
Cocaine (Metab) Scrn, Ur: NEGATIVE ng/mL
Creatinine(Crt), U: 280.8 mg/dL (ref 20.0–300.0)
Fentanyl, Urine: NEGATIVE pg/mL
Meperidine Screen, Urine: NEGATIVE ng/mL
Methadone Screen, Urine: NEGATIVE ng/mL
OXYCODONE+OXYMORPHONE UR QL SCN: NEGATIVE ng/mL
Opiate Scrn, Ur: NEGATIVE ng/mL
Ph of Urine: 5.8 (ref 4.5–8.9)
Phencyclidine Qn, Ur: NEGATIVE ng/mL
Propoxyphene Scrn, Ur: NEGATIVE ng/mL
SPECIFIC GRAVITY: 1.024
Tramadol Screen, Urine: NEGATIVE ng/mL

## 2022-05-31 LAB — CULTURE, OB URINE

## 2022-05-31 LAB — URINE CYTOLOGY ANCILLARY ONLY
Chlamydia: NEGATIVE
Comment: NEGATIVE
Comment: NORMAL
Neisseria Gonorrhea: NEGATIVE

## 2022-05-31 LAB — URINE CULTURE, OB REFLEX

## 2022-06-03 ENCOUNTER — Encounter: Payer: Self-pay | Admitting: Obstetrics

## 2022-06-03 LAB — MATERNIT 21 PLUS CORE, BLOOD
Fetal Fraction: 4
Result (T21): NEGATIVE
Trisomy 13 (Patau syndrome): NEGATIVE
Trisomy 18 (Edwards syndrome): NEGATIVE
Trisomy 21 (Down syndrome): NEGATIVE

## 2022-06-06 ENCOUNTER — Other Ambulatory Visit: Payer: Self-pay | Admitting: Certified Nurse Midwife

## 2022-06-06 DIAGNOSIS — Z3A16 16 weeks gestation of pregnancy: Secondary | ICD-10-CM

## 2022-06-06 DIAGNOSIS — Z87898 Personal history of other specified conditions: Secondary | ICD-10-CM

## 2022-06-20 ENCOUNTER — Encounter: Payer: Self-pay | Admitting: Obstetrics and Gynecology

## 2022-06-20 NOTE — Telephone Encounter (Signed)
Spoke to patient offered to schedule apt she declined, she was not feeling dizzy or light headed, stated she felt baby kick this morning, and had some cramps.Read nausea and vomiting tips from [place] packet explained the risks of getting dehydrated and when to go to ED. She had no other concern at this time. Pt verbalized understanding.

## 2022-06-27 ENCOUNTER — Encounter: Payer: Self-pay | Admitting: Obstetrics and Gynecology

## 2022-06-27 ENCOUNTER — Ambulatory Visit (INDEPENDENT_AMBULATORY_CARE_PROVIDER_SITE_OTHER): Payer: BC Managed Care – PPO | Admitting: Obstetrics and Gynecology

## 2022-06-27 ENCOUNTER — Other Ambulatory Visit: Payer: BC Managed Care – PPO

## 2022-06-27 ENCOUNTER — Other Ambulatory Visit (HOSPITAL_COMMUNITY)
Admission: RE | Admit: 2022-06-27 | Discharge: 2022-06-27 | Disposition: A | Discharge status: Home / Self Care | Payer: BC Managed Care – PPO | Source: Ambulatory Visit | Attending: Obstetrics and Gynecology | Admitting: Obstetrics and Gynecology

## 2022-06-27 VITALS — Wt 247.0 lb

## 2022-06-27 DIAGNOSIS — N898 Other specified noninflammatory disorders of vagina: Secondary | ICD-10-CM | POA: Diagnosis present

## 2022-06-27 DIAGNOSIS — Z3A17 17 weeks gestation of pregnancy: Secondary | ICD-10-CM

## 2022-06-27 DIAGNOSIS — R7303 Prediabetes: Secondary | ICD-10-CM

## 2022-06-27 DIAGNOSIS — O99210 Obesity complicating pregnancy, unspecified trimester: Secondary | ICD-10-CM

## 2022-06-27 DIAGNOSIS — Z3A16 16 weeks gestation of pregnancy: Secondary | ICD-10-CM

## 2022-06-27 DIAGNOSIS — O34219 Maternal care for unspecified type scar from previous cesarean delivery: Secondary | ICD-10-CM

## 2022-06-27 DIAGNOSIS — O99212 Obesity complicating pregnancy, second trimester: Secondary | ICD-10-CM

## 2022-06-27 DIAGNOSIS — Z1379 Encounter for other screening for genetic and chromosomal anomalies: Secondary | ICD-10-CM

## 2022-06-27 DIAGNOSIS — O0992 Supervision of high risk pregnancy, unspecified, second trimester: Secondary | ICD-10-CM

## 2022-06-27 DIAGNOSIS — E669 Obesity, unspecified: Secondary | ICD-10-CM

## 2022-06-27 DIAGNOSIS — Z87898 Personal history of other specified conditions: Secondary | ICD-10-CM

## 2022-06-27 DIAGNOSIS — O09293 Supervision of pregnancy with other poor reproductive or obstetric history, third trimester: Secondary | ICD-10-CM

## 2022-06-27 LAB — POCT URINALYSIS DIPSTICK OB
Bilirubin, UA: NEGATIVE
Blood, UA: NEGATIVE
Glucose, UA: NEGATIVE
Leukocytes, UA: NEGATIVE
Nitrite, UA: NEGATIVE
POC,PROTEIN,UA: NEGATIVE
Spec Grav, UA: 1.02 (ref 1.010–1.025)
Urobilinogen, UA: 0.2 E.U./dL
pH, UA: 6.5 (ref 5.0–8.0)

## 2022-06-27 NOTE — Progress Notes (Addendum)
ROB: Patient is a 27 y.o. G2P1001 at 60w6dwho presents for routine OB care.  Pregnancy complicated by obesity (BMI 42), h/o C/S, desiring TOLAC, prediabetes. Completing early glucose today.  Discussed AFP, ok to perform. She reports thick clumpy vaginal discharge, desires to perform self-swab testing. Will notify results through [facility]  Answered general questions regarding TOLAC. Further discussion to be had in 3rd trimester. RTC in 4 weeks, for anatomy scan scheduled in in 1 week.

## 2022-06-27 NOTE — Patient Instructions (Signed)
WHAT OB PATIENTS CAN EXPECT  Confirmation of pregnancy and ultrasound ordered if medically indicated-[redacted] weeks gestation New OB (NOB) intake with nurse and New OB (NOB) labs- [redacted] weeks gestation New OB (NOB) physical examination with provider- 11/[redacted] weeks gestation Flu vaccine-[redacted] weeks gestation Anatomy scan-[redacted] weeks gestation Glucose tolerance test, blood work to test for anemia, T-dap vaccine-[redacted] weeks gestation Vaginal swabs/cultures-STD/Group B strep-[redacted] weeks gestation Appointments every 4 weeks until 28 weeks Every 2 weeks from 28 weeks until 36 weeks Weekly visits from 36 weeks until delivery

## 2022-06-28 LAB — CERVICOVAGINAL ANCILLARY ONLY
Bacterial Vaginitis (gardnerella): NEGATIVE
Candida Glabrata: NEGATIVE
Candida Vaginitis: NEGATIVE
Comment: NEGATIVE
Comment: NEGATIVE
Comment: NEGATIVE

## 2022-06-28 LAB — GLUCOSE TOLERANCE, 1 HOUR: Glucose, 1Hr PP: 97 mg/dL (ref 70–199)

## 2022-06-30 LAB — AFP, SERUM, OPEN SPINA BIFIDA
AFP MoM: 0.88
AFP Value: 31.3 ng/mL
Gest. Age on Collection Date: 17.9 weeks
Maternal Age At EDD: 24.7 yr
OSBR Risk 1 IN: 10000
Test Results:: NEGATIVE
Weight: 248 [lb_av]

## 2022-07-04 ENCOUNTER — Ambulatory Visit
Admission: RE | Admit: 2022-07-04 | Discharge: 2022-07-04 | Disposition: A | Discharge status: Home / Self Care | Payer: BC Managed Care – PPO | Source: Ambulatory Visit | Attending: Obstetrics | Admitting: Obstetrics

## 2022-07-04 DIAGNOSIS — O0992 Supervision of high risk pregnancy, unspecified, second trimester: Secondary | ICD-10-CM

## 2022-07-07 ENCOUNTER — Encounter: Payer: Self-pay | Admitting: Obstetrics

## 2022-07-14 ENCOUNTER — Encounter: Payer: Self-pay | Admitting: Obstetrics and Gynecology

## 2022-07-14 NOTE — Telephone Encounter (Signed)
Called patient and didn't get an answer. LVM to advise her to try taking Tylenol for the pain and drink some water sometimes dehydration can cause cramping. Advised if there was any bleeding and soaking of a pad every hour to go to ED.

## 2022-07-25 ENCOUNTER — Ambulatory Visit (INDEPENDENT_AMBULATORY_CARE_PROVIDER_SITE_OTHER): Payer: BC Managed Care – PPO | Admitting: Obstetrics

## 2022-07-25 VITALS — BP 108/71 | HR 91 | Wt 231.1 lb

## 2022-07-25 DIAGNOSIS — Z3A21 21 weeks gestation of pregnancy: Secondary | ICD-10-CM

## 2022-07-25 DIAGNOSIS — Z3482 Encounter for supervision of other normal pregnancy, second trimester: Secondary | ICD-10-CM

## 2022-07-25 DIAGNOSIS — Z3480 Encounter for supervision of other normal pregnancy, unspecified trimester: Secondary | ICD-10-CM

## 2022-07-25 LAB — POCT URINALYSIS DIPSTICK OB
Bilirubin, UA: NEGATIVE
Blood, UA: NEGATIVE
Glucose, UA: NEGATIVE
Ketones, UA: POSITIVE
Leukocytes, UA: NEGATIVE
Nitrite, UA: NEGATIVE
Spec Grav, UA: 1.015 (ref 1.010–1.025)
Urobilinogen, UA: 1 E.U./dL
pH, UA: 7 (ref 5.0–8.0)

## 2022-07-25 NOTE — Progress Notes (Signed)
Routine Prenatal Care Visit  Subjective  Brenda Mccoy is a 27 y.o. G2P1001 at [redacted]w[redacted]d being seen today for ongoing prenatal care.  She is currently monitored for the following issues for this high-risk pregnancy and has Prediabetes; Type B blood, Rh positive; Obesity in pregnancy; COVID-19; History of premature rupture of membranes; Cesarean delivery delivered; Non-reassuring fetal heart rate with late deceleration; Postpartum hemorrhage; Acute vaginitis; Encounter for routine child health examination without abnormal findings; STD exposure; Yeast vaginitis; Supervision of other normal pregnancy, antepartum; and History of cesarean delivery affecting pregnancy on their problem list.  ----------------------------------------------------------------------------------- Patient reports no complaints.  She works at [employer]. Having another boy- thinking about the name Alexia Freestone Contractions: Not present. Vag. Bleeding: None.  Movement: Present. Leaking Fluid denies.  ----------------------------------------------------------------------------------- The following portions of the patient's history were reviewed and updated as appropriate: allergies, current medications, past family history, past medical history, past social history, past surgical history and problem list. Problem list updated.  Objective  Blood pressure 108/71, pulse 91, weight 231 lb 1.6 oz (104.8 kg), not currently breastfeeding. Pregravid weight Pregravid weight not on file Total Weight Gain Not found. Urinalysis: Urine Protein    Urine Glucose    Fetal Status:     Movement: Present     General:  Alert, oriented and cooperative. Patient is in no acute distress.  Skin: Skin is warm and dry. No rash noted.   Cardiovascular: Normal heart rate noted  Respiratory: Normal respiratory effort, no problems with respiration noted  Abdomen: Soft, gravid, appropriate for gestational age. Pain/Pressure: Present     Pelvic:  Cervical exam  deferred        Extremities: Normal range of motion.  Edema: None  Mental Status: Normal mood and affect. Normal behavior. Normal judgment and thought content.   Assessment   27 y.o. G2P1001 at [redacted]w[redacted]d by  11/29/2022, Date entered prior to episode creation presenting for routine prenatal visit  Plan   second Problems (from 05/26/22 to present)     Problem Noted Resolved   Supervision of other normal pregnancy, antepartum 05/26/2022 by Cleophas Dunker, CMA No   Overview Addendum 07/25/2022  3:54 PM by Imagene Riches, CNM     Clinical Staff Provider  Office Location  [facility] Dating  Not found.  Language  English Anatomy US  Normal female  Flu Vaccine  offer Genetic Screen  NIPS:   TDaP vaccine   offer Hgb A1C or  GTT Early :WNL Third trimester :   Covid declined   LAB RESULTS   Rhogam  B/Positive/-- (02/05 1555)  Blood Type B/Positive/-- (02/05 1555)   Feeding Plan breast Antibody Negative (02/05 1555)  Contraception undecided Rubella 13.60 (02/05 1555)  Circumcision yes RPR Non Reactive (02/05 1555)   Pediatrician  [facility] HBsAg Negative (02/05 1555)   Support Person Nanda Quinton HIV Non Reactive (02/05 1555)  Prenatal Classes no Varicella     GBS  (For PCN allergy, check sensitivities)   BTL Consent  Hep C Non Reactive (02/05 1555)   VBAC Consent  Pap Diagnosis  Date Value Ref Range Status  10/13/2019   Final   - Negative for intraepithelial lesion or malignancy (NILM)      Hgb Electro      CF      SMA                   Preterm labor symptoms and general obstetric precautions including but not limited to vaginal bleeding, contractions,  leaking of fluid and fetal movement were reviewed in detail with the patient. Please refer to After Visit Summary for other counseling recommendations.   No follow-ups on file.  Imagene Riches, CNM  07/25/2022 4:04 PM

## 2022-07-26 ENCOUNTER — Encounter: Payer: Self-pay | Admitting: Obstetrics and Gynecology

## 2022-08-17 ENCOUNTER — Ambulatory Visit
Admission: RE | Admit: 2022-08-17 | Discharge: 2022-08-17 | Disposition: A | Discharge status: Home / Self Care | Payer: BC Managed Care – PPO | Source: Ambulatory Visit | Attending: Family Medicine | Admitting: Family Medicine

## 2022-08-17 VITALS — BP 118/86 | HR 107 | Temp 98.7°F | Resp 18

## 2022-08-17 DIAGNOSIS — R102 Pelvic and perineal pain: Secondary | ICD-10-CM | POA: Diagnosis present

## 2022-08-17 DIAGNOSIS — O26892 Other specified pregnancy related conditions, second trimester: Secondary | ICD-10-CM | POA: Insufficient documentation

## 2022-08-17 DIAGNOSIS — Z3A25 25 weeks gestation of pregnancy: Secondary | ICD-10-CM | POA: Diagnosis not present

## 2022-08-17 LAB — URINALYSIS, W/ REFLEX TO CULTURE (INFECTION SUSPECTED)
Bilirubin Urine: NEGATIVE
Glucose, UA: NEGATIVE mg/dL
Hgb urine dipstick: NEGATIVE
Ketones, ur: NEGATIVE mg/dL
Leukocytes,Ua: NEGATIVE
Nitrite: NEGATIVE
Protein, ur: NEGATIVE mg/dL
RBC / HPF: NONE SEEN RBC/hpf (ref 0–5)
Specific Gravity, Urine: 1.02 (ref 1.005–1.030)
Squamous Epithelial / HPF: >50 /HPF (ref 0–5)
pH: 7 (ref 5.0–8.0)

## 2022-08-17 LAB — WET PREP, GENITAL
Clue Cells Wet Prep HPF POC: NONE SEEN
Sperm: NONE SEEN
Trich, Wet Prep: NONE SEEN
WBC, Wet Prep HPF POC: <10 — AB (ref <10)
Yeast Wet Prep HPF POC: NONE SEEN

## 2022-08-17 NOTE — Discharge Instructions (Addendum)
Take 2-500 mg Tylenol up to 3 times a day as needed for pain.  Follow up with your OB as scheduled.  See handout on round ligament pain.   I sent your urine for culture to ensure you do not have a urinary tract infection. Your STD testing should return in the next 48-72 hours.    Consider purchasing a pelvic girdle to help with your pain.

## 2022-08-17 NOTE — ED Triage Notes (Signed)
Pt presents with right side back pain and pelvic pain x 4 days.

## 2022-08-17 NOTE — ED Provider Notes (Signed)
[facility]    CSN: [id number] Arrival date & time: 08/17/22  1752      History   Chief Complaint Chief Complaint  Patient presents with   Back Pain    Im having some back pain and side pain in the right now as well as pain where the bladder is located. I believe I have a uti - Entered by patient     HPI HPI Brenda Mccoy is a 27 y.o. female.    Yamil Dougher presents for concern for UTI for the past 4 days. Has bilateral side pain that she took Tylenol for and it helped.   She is [redacted] weeks pregnant.  No urinary frequency, urgency, dysuria and hematuria.   Has  not had any antibiotics in last 30 days.   Denies known STI exposure.   Reports no symptoms in her partner.   - Abnormal vaginal discharge: milky white discharge - vaginal bleeding: no - contractions  - leaking of fluid: no  - Dysuria: no - Hematuria: no - Urinary urgency: no - Urinary frequency: no  - Fever: no - Abdominal pain no - Pelvic pain: yes - Rash/Skin lesions/mouth ulcers: no - Nausea: no  - Vomiting: no  - Back Pain: low  - Flank pain: bilateral        Past Medical History:  Diagnosis Date   Anemia     Patient Active Problem List   Diagnosis Date Noted   History of cesarean delivery affecting pregnancy 06/27/2022   Supervision of other normal pregnancy, antepartum 05/26/2022   Postpartum hemorrhage 09/06/2020   Cesarean delivery delivered    Non-reassuring fetal heart rate with late deceleration    History of premature rupture of membranes 09/04/2020   COVID-19 09/03/2020   Prediabetes 02/16/2020   Type B blood, Rh positive 02/16/2020   Obesity in pregnancy 02/16/2020   STD exposure 12/18/2017   Yeast vaginitis 12/01/2017   Acute vaginitis 09/04/2017   Encounter for routine child health examination without abnormal findings 01/06/2016    Past Surgical History:  Procedure Laterality Date   CESAREAN SECTION  09/05/2020   Procedure: CESAREAN SECTION;  Surgeon:  Hildred Laser, MD;  Location: [facility] ORS;  Service: Obstetrics;;   ROOT CANAL     WISDOM TOOTH EXTRACTION     1    OB History     Gravida  2   Para  1   Term  1   Preterm      AB      Living  1      SAB      IAB      Ectopic      Multiple  0   Live Births  1            Home Medications    Prior to Admission medications   Medication Sig Start Date End Date Taking? Authorizing Provider  prenatal vitamin w/FE, FA (NATACHEW) 29-1 MG CHEW chewable tablet Chew 2 tablets by mouth daily at 12 noon.    [provider]    Family History Family History  Problem Relation Age of Onset   Hypertension Mother    Healthy Father    Other Sister        HPV   Healthy Sister    Healthy Sister    Healthy Sister    Healthy Brother    Healthy Brother    Healthy Brother    Healthy Maternal Grandmother    Hypertension Maternal Grandfather  Cancer Paternal Grandmother    Healthy Paternal Grandmother     Social History Social History   Tobacco Use   Smoking status: Never   Smokeless tobacco: Never  Vaping Use   Vaping Use: Never used  Substance Use Topics   Alcohol use: Not Currently   Drug use: Never     Allergies   Miconazole   Review of Systems Review of Systems: :negative unless otherwise stated in HPI.      Physical Exam Triage Vital Signs ED Triage Vitals  Enc Vitals Group     BP 08/17/22 1806 118/86     Pulse Rate 08/17/22 1806 (!) 107     Resp 08/17/22 1806 18     Temp 08/17/22 1806 98.7 F (37.1 C)     Temp Source 08/17/22 1806 Oral     SpO2 08/17/22 1806 99 %     Weight --      Height --      Head Circumference --      Peak Flow --      Pain Score 08/17/22 1805 4     Pain Loc --      Pain Edu? --      Excl. in GC? --    No data found.  Updated Vital Signs BP 118/86 (BP Location: Left Arm)   Pulse (!) 107   Temp 98.7 F (37.1 C) (Oral)   Resp 18   LMP  (LMP Unknown)   SpO2 99%   Visual Acuity Right Eye  Distance:   Left Eye Distance:   Bilateral Distance:    Right Eye Near:   Left Eye Near:    Bilateral Near:     Physical Exam GEN: well appearing female in no acute distress  CVS: well perfused, regular rate and rhythm RESP: speaking in full sentences without pause, clear bilaterally   ABD: soft, non-tender, non-distended, no palpable masses   GU: deferred, patient performed self swab     UC Treatments / Results  Labs (all labs ordered are listed, but only abnormal results are displayed) Labs Reviewed  WET PREP, GENITAL - Abnormal; Notable for the following components:      Result Value   WBC, Wet Prep HPF POC <10 (*)    All other components within normal limits  URINE CULTURE  URINALYSIS, W/ REFLEX TO CULTURE (INFECTION SUSPECTED) - Abnormal; Notable for the following components:   Bacteria, UA FEW (*)    All other components within normal limits  CERVICOVAGINAL ANCILLARY ONLY    EKG   Radiology No results found.  Procedures Procedures (including critical care time)  Medications Ordered in UC Medications - No data to display  Initial Impression / Assessment and Plan / UC Course  I have reviewed the triage vital signs and the nursing notes.  Pertinent labs & imaging results that were available during my care of the patient were reviewed by me and considered in my medical decision making (see chart for details).      Patient is a 27 y.o.Brenda Mccoy female who is 25w pregnant presents for pelvic/lower abdominal pain.  Overall patient is well-appearing and afebrile.  Vital signs stable.  UA not consistent with acute cystitis and no hematuria not supported on microscopy. Urine culture obtained. Start antibiotics if needed.   No yeast vaginitis and bacterial vaginitis  on wet prep.  Gonorrhea, trichomoniasis and Chlamydia testing obtained.    I suspect round ligament pain.  Recommended a pelvic girdle and Tylenol PRN.  She is to follow up with her OB. Return precautions  including abdominal pain, fever, chills, nausea, or vomiting given. Discussed MDM, treatment plan and plan for follow-up with patient who agrees with plan.        Final Clinical Impressions(s) / UC Diagnoses   Final diagnoses:  Pelvic pain affecting pregnancy in second trimester, antepartum     Discharge Instructions      Take 2-500 mg Tylenol up to 3 times a day as needed for pain.  Follow up with your OB as scheduled.  See handout on round ligament pain.   I sent your urine for culture to ensure you do not have a urinary tract infection. Your STD testing should return in the next 48-72 hours.    Consider purchasing a pelvic girdle to help with your pain.          ED Prescriptions   None    PDMP not reviewed this encounter.   Katha Cabal, DO 08/22/22 2124

## 2022-08-18 LAB — CERVICOVAGINAL ANCILLARY ONLY
Chlamydia: NEGATIVE
Comment: NEGATIVE
Comment: NEGATIVE
Comment: NORMAL
Neisseria Gonorrhea: NEGATIVE
Trichomonas: NEGATIVE

## 2022-08-19 LAB — URINE CULTURE

## 2022-08-23 ENCOUNTER — Ambulatory Visit (INDEPENDENT_AMBULATORY_CARE_PROVIDER_SITE_OTHER): Payer: BC Managed Care – PPO | Admitting: Obstetrics and Gynecology

## 2022-08-23 ENCOUNTER — Encounter: Payer: Self-pay | Admitting: Obstetrics and Gynecology

## 2022-08-23 VITALS — BP 128/75 | HR 90 | Wt 246.3 lb

## 2022-08-23 DIAGNOSIS — Z3482 Encounter for supervision of other normal pregnancy, second trimester: Secondary | ICD-10-CM

## 2022-08-23 DIAGNOSIS — Z113 Encounter for screening for infections with a predominantly sexual mode of transmission: Secondary | ICD-10-CM

## 2022-08-23 DIAGNOSIS — Z131 Encounter for screening for diabetes mellitus: Secondary | ICD-10-CM

## 2022-08-23 DIAGNOSIS — Z3A26 26 weeks gestation of pregnancy: Secondary | ICD-10-CM

## 2022-08-23 DIAGNOSIS — Z3480 Encounter for supervision of other normal pregnancy, unspecified trimester: Secondary | ICD-10-CM

## 2022-08-23 LAB — POCT URINALYSIS DIPSTICK OB
Bilirubin, UA: NEGATIVE
Blood, UA: NEGATIVE
Glucose, UA: NEGATIVE
Leukocytes, UA: NEGATIVE
Nitrite, UA: NEGATIVE
POC,PROTEIN,UA: NEGATIVE
Spec Grav, UA: 1.01 (ref 1.010–1.025)
Urobilinogen, UA: 0.2 E.U./dL
pH, UA: 6.5 (ref 5.0–8.0)

## 2022-08-23 NOTE — Progress Notes (Signed)
ROB: Doing well.  Reports daily fetal movement.  History of prior cesarean delivery.  Patient has reiterated her desire for TOLAC.  Multiple questions answered including possibility of repeat cesarean delivery and method of induction should that be necessary.  1 hour GCT next visit. VBAC/TOLAC I have discussed the possibility of Vaginal Birth After Cesarean with the patient.  She has been made aware that this procedure is becoming less and less common because if its attendant risks and the increased understanding of these risks that we have gained over the last several years.  Should she choose VBAC, she does have a slightly increased risk of uterine rupture or other pregnancy complication resulting from a dysfunctional uterus.  She is aware that fetal death may result if uterine rupture and expulsion of the fetus occurs.  We have discussed the fact that this is a very small risk (approximately , 1 percent) and that even if the uterus ruptures, few babies are lost.  She has been informed that should she choose VBAC, a Cesarean delivery for other indications may still be necessary.  The advantages of VBAC and of repeat Cesarean delivery have been discussed.  All of her questions have been answered and I believe she has an informed understanding of both repeat Cesarean delivery and Vaginal Birth After Cesarean.

## 2022-08-23 NOTE — Progress Notes (Signed)
ROB. Patient states daily fetal movement, back pain has resolved.

## 2022-09-06 ENCOUNTER — Other Ambulatory Visit: Payer: BC Managed Care – PPO

## 2022-09-06 DIAGNOSIS — Z113 Encounter for screening for infections with a predominantly sexual mode of transmission: Secondary | ICD-10-CM

## 2022-09-06 DIAGNOSIS — Z3480 Encounter for supervision of other normal pregnancy, unspecified trimester: Secondary | ICD-10-CM

## 2022-09-06 DIAGNOSIS — Z131 Encounter for screening for diabetes mellitus: Secondary | ICD-10-CM

## 2022-09-07 LAB — 28 WEEK RH+PANEL
Basophils Absolute: 0.1 10*3/uL (ref 0.0–0.2)
Basos: 1 %
EOS (ABSOLUTE): 0.1 10*3/uL (ref 0.0–0.4)
Eos: 2 %
Gestational Diabetes Screen: 101 mg/dL (ref 70–139)
HIV Screen 4th Generation wRfx: NONREACTIVE
Hematocrit: 30.9 % — ABNORMAL LOW (ref 34.0–46.6)
Hemoglobin: 10.2 g/dL — ABNORMAL LOW (ref 11.1–15.9)
Immature Grans (Abs): 0 10*3/uL (ref 0.0–0.1)
Immature Granulocytes: 1 %
Lymphocytes Absolute: 2.2 10*3/uL (ref 0.7–3.1)
Lymphs: 42 %
MCH: 28.9 pg (ref 26.6–33.0)
MCHC: 33 g/dL (ref 31.5–35.7)
MCV: 88 fL (ref 79–97)
Monocytes Absolute: 0.2 10*3/uL (ref 0.1–0.9)
Monocytes: 4 %
Neutrophils Absolute: 2.7 10*3/uL (ref 1.4–7.0)
Neutrophils: 50 %
Platelets: 159 10*3/uL (ref 150–450)
RBC: 3.53 x10E6/uL — ABNORMAL LOW (ref 3.77–5.28)
RDW: 13.3 % (ref 11.7–15.4)
RPR Ser Ql: NONREACTIVE
WBC: 5.2 10*3/uL (ref 3.4–10.8)

## 2022-09-14 ENCOUNTER — Ambulatory Visit (INDEPENDENT_AMBULATORY_CARE_PROVIDER_SITE_OTHER): Payer: BC Managed Care – PPO | Admitting: Obstetrics

## 2022-09-14 VITALS — BP 118/82 | HR 97 | Wt 242.0 lb

## 2022-09-14 DIAGNOSIS — R7303 Prediabetes: Secondary | ICD-10-CM

## 2022-09-14 DIAGNOSIS — O99210 Obesity complicating pregnancy, unspecified trimester: Secondary | ICD-10-CM

## 2022-09-14 DIAGNOSIS — Z3A29 29 weeks gestation of pregnancy: Secondary | ICD-10-CM

## 2022-09-14 DIAGNOSIS — O34219 Maternal care for unspecified type scar from previous cesarean delivery: Secondary | ICD-10-CM

## 2022-09-14 DIAGNOSIS — Z3480 Encounter for supervision of other normal pregnancy, unspecified trimester: Secondary | ICD-10-CM

## 2022-09-14 DIAGNOSIS — O99891 Other specified diseases and conditions complicating pregnancy: Secondary | ICD-10-CM

## 2022-09-14 DIAGNOSIS — O99213 Obesity complicating pregnancy, third trimester: Secondary | ICD-10-CM

## 2022-09-14 DIAGNOSIS — E669 Obesity, unspecified: Secondary | ICD-10-CM

## 2022-09-14 MED ORDER — FUSION 65-65-25-30 MG PO CAPS: 1.0000 | ORAL_CAPSULE | Freq: Every day | ORAL | 3 refills | Status: DC

## 2022-09-14 NOTE — Progress Notes (Signed)
   PRENATAL VISIT NOTE  Subjective:  Brenda Mccoy is a 27 y.o. G2P1001 at [redacted]w[redacted]d being seen today for ongoing prenatal care.  She is currently monitored for the following issues for this high-risk pregnancy and has Prediabetes; Type B blood, Rh positive; Obesity in pregnancy; COVID-19; History of premature rupture of membranes; Cesarean delivery delivered; Non-reassuring fetal heart rate with late deceleration; Postpartum hemorrhage; Acute vaginitis; Encounter for routine child health examination without abnormal findings; STD exposure; Yeast vaginitis; Supervision of other normal pregnancy, antepartum; and History of cesarean delivery affecting pregnancy on their problem list.  Patient reports no complaints.  Contractions: Irritability. Vag. Bleeding: None.  Movement: Present. Denies leaking of fluid.   The following portions of the patient's history were reviewed and updated as appropriate: allergies, current medications, past family history, past medical history, past social history, past surgical history and problem list.   Objective:   Vitals:   09/14/22 1609  BP: 118/82  Pulse: 97  Weight: 242 lb (109.8 kg)   Total weight gain: -9 lb (-4.082 kg) Fetal Status: Fetal Heart Rate (bpm): 140 Fundal Height: 29 cm Movement: Present      General:  Alert, oriented and cooperative. Patient is in no acute distress.  Skin: Skin is warm and dry. No rash noted.   Cardiovascular: Normal heart rate noted  Respiratory: Normal respiratory effort, no problems with respiration noted  Abdomen: Soft, gravid, appropriate for gestational age.  Pain/Pressure: Present     Pelvic: Cervical exam deferred        Extremities: Normal range of motion.  Edema: None  Mental Status: Normal mood and affect. Normal behavior. Normal judgment and thought content.   Assessment and Plan:  Pregnancy: G2P1001 at [redacted]w[redacted]d 1. Supervision of other normal pregnancy, antepartum -Hgb 10.2 at 28w labs, iron prescribed. - POC  Urinalysis Dipstick OB -History prior LTCS, desires VBAC, saw Dr. Logan Bores at last visit, VBAC consent form provided today & she will bring back at next visit.  2. Obesity in pregnancy -discussed NSTs beginning at 36w  3. Prediabetes 1h GTT normal   Preterm labor symptoms and general obstetric precautions including but not limited to vaginal bleeding, contractions, leaking of fluid and fetal movement were reviewed in detail with the patient. Please refer to After Visit Summary for other counseling recommendations.   Return in 2 weeks (on 09/28/2022) for ROB.  Future Appointments  Date Time Provider Department Center  10/06/2022  3:35 PM Mirna Mires, CNM [facility] None    Dominica Severin, CNM

## 2022-09-26 ENCOUNTER — Encounter: Payer: Self-pay | Admitting: Obstetrics and Gynecology

## 2022-09-27 ENCOUNTER — Other Ambulatory Visit: Payer: Self-pay

## 2022-09-27 MED ORDER — ACCRUFER 30 MG PO CAPS: 1.0000 | ORAL_CAPSULE | Freq: Every day | ORAL | 3 refills | Status: DC

## 2022-09-28 ENCOUNTER — Encounter: Payer: Self-pay | Admitting: Obstetrics and Gynecology

## 2022-10-06 ENCOUNTER — Ambulatory Visit (INDEPENDENT_AMBULATORY_CARE_PROVIDER_SITE_OTHER): Payer: BC Managed Care – PPO | Admitting: Obstetrics

## 2022-10-06 VITALS — BP 100/65 | HR 103 | Wt 246.0 lb

## 2022-10-06 DIAGNOSIS — Z3480 Encounter for supervision of other normal pregnancy, unspecified trimester: Secondary | ICD-10-CM

## 2022-10-06 DIAGNOSIS — E669 Obesity, unspecified: Secondary | ICD-10-CM

## 2022-10-06 DIAGNOSIS — Z23 Encounter for immunization: Secondary | ICD-10-CM | POA: Diagnosis not present

## 2022-10-06 DIAGNOSIS — O99210 Obesity complicating pregnancy, unspecified trimester: Secondary | ICD-10-CM

## 2022-10-06 DIAGNOSIS — Z3A32 32 weeks gestation of pregnancy: Secondary | ICD-10-CM

## 2022-10-06 DIAGNOSIS — O99213 Obesity complicating pregnancy, third trimester: Secondary | ICD-10-CM

## 2022-10-06 LAB — POCT URINALYSIS DIPSTICK OB
Bilirubin, UA: NEGATIVE
Blood, UA: NEGATIVE
Glucose, UA: NEGATIVE
Leukocytes, UA: NEGATIVE
Nitrite, UA: NEGATIVE
Spec Grav, UA: 1.01 (ref 1.010–1.025)
Urobilinogen, UA: 0.2 E.U./dL
pH, UA: 6.5 (ref 5.0–8.0)

## 2022-10-06 NOTE — Progress Notes (Signed)
Routine Prenatal Care Visit  Subjective  Brenda Mccoy is a 27 y.o. G2P1001 at [redacted]w[redacted]d being seen today for ongoing prenatal care.  She is currently monitored for the following issues for this high-risk pregnancy and has Prediabetes; Type B blood, Rh positive; Obesity in pregnancy; COVID-19; History of premature rupture of membranes; Cesarean delivery delivered; Non-reassuring fetal heart rate with late deceleration; Postpartum hemorrhage; Acute vaginitis; Encounter for routine child health examination without abnormal findings; STD exposure; Yeast vaginitis; Supervision of other normal pregnancy, antepartum; and History of cesarean delivery affecting pregnancy on their problem list.  --------------------------------------------she desires TOLAC. Contractions: Not present. Vag. Bleeding: None.  Movement: Present. Leaking Fluid denies.  ----------------------------------------------------------------------------------- The following portions of the patient's history were reviewed and updated as appropriate: allergies, current medications, past family history, past medical history, past social history, past surgical history and problem list. Problem list updated.  Objective  Blood pressure 100/65, pulse (!) 103, weight 246 lb (111.6 kg). Pregravid weight 251 lb (113.9 kg) Total Weight Gain -5 lb (-2.268 kg) Urinalysis: Urine Protein    Urine Glucose    Fetal Status:     Movement: Present     General:  Alert, oriented and cooperative. Patient is in no acute distress.  Skin: Skin is warm and dry. No rash noted.   Cardiovascular: Normal heart rate noted  Respiratory: Normal respiratory effort, no problems with respiration noted  Abdomen: Soft, gravid, appropriate for gestational age. Pain/Pressure: Absent     Pelvic:  Cervical exam deferred        Extremities: Normal range of motion.     Mental Status: Normal mood and affect. Normal behavior. Normal judgment and thought content.   Assessment    26 y.o. G2P1001 at [redacted]w[redacted]d by  11/29/2022, Date entered prior to episode creation presenting for routine prenatal visit  Plan   second Problems (from 05/26/22 to present)     Problem Noted Resolved   Supervision of other normal pregnancy, antepartum 05/26/2022 by Loran Senters, CMA No   Overview Addendum 09/14/2022  4:16 PM by Cornelius Moras, CMA     Clinical Staff Provider  Office Location  [facility] Dating  Not found.  Language  English Anatomy US  Normal female  Flu Vaccine  offer Genetic Screen  NIPS:   TDaP vaccine  Next visit5 Hgb A1C or  GTT Early :WNL Third trimester :   Covid declined   LAB RESULTS   Rhogam  B/Positive/-- (02/05 1555)  Blood Type B/Positive/-- (02/05 1555)   Feeding Plan breast Antibody Negative (02/05 1555)  Contraception undecided Rubella 13.60 (02/05 1555)  Circumcision yes RPR Non Reactive (02/05 1555)   Pediatrician  [facility] HBsAg Negative (02/05 1555)   Support Person Franz Dell HIV Non Reactive (02/05 1555)  Prenatal Classes no Varicella     GBS  (For PCN allergy, check sensitivities)   BTL Consent  Hep C Non Reactive (02/05 1555)   VBAC Consent  Pap Diagnosis  Date Value Ref Range Status  10/13/2019   Final   - Negative for intraepithelial lesion or malignancy (NILM)      Hgb Electro      CF      SMA                   Preterm labor symptoms and general obstetric precautions including but not limited to vaginal bleeding, contractions, leaking of fluid and fetal movement were reviewed in detail with the patient. Please refer to After Visit Summary for  other counseling recommendations.  Much verbal support for VBAC today. She has not gained weight thus far, and is walking a great deal.   Return in about 2 weeks (around 10/20/2022) for return OB.  Mirna Mires, CNM  10/06/2022 3:50 PM

## 2022-10-08 ENCOUNTER — Other Ambulatory Visit: Payer: Self-pay

## 2022-10-08 ENCOUNTER — Observation Stay
Admission: EM | Admit: 2022-10-08 | Discharge: 2022-10-08 | Disposition: A | Discharge status: Home / Self Care | Payer: BC Managed Care – PPO | Attending: Obstetrics and Gynecology | Admitting: Obstetrics and Gynecology

## 2022-10-08 DIAGNOSIS — O26893 Other specified pregnancy related conditions, third trimester: Principal | ICD-10-CM | POA: Insufficient documentation

## 2022-10-08 DIAGNOSIS — O26899 Other specified pregnancy related conditions, unspecified trimester: Secondary | ICD-10-CM | POA: Diagnosis present

## 2022-10-08 DIAGNOSIS — R519 Headache, unspecified: Secondary | ICD-10-CM | POA: Diagnosis not present

## 2022-10-08 DIAGNOSIS — Z3A32 32 weeks gestation of pregnancy: Secondary | ICD-10-CM | POA: Diagnosis not present

## 2022-10-08 DIAGNOSIS — O368130 Decreased fetal movements, third trimester, not applicable or unspecified: Secondary | ICD-10-CM | POA: Insufficient documentation

## 2022-10-08 LAB — COMPREHENSIVE METABOLIC PANEL
ALT: 12 U/L (ref 0–44)
AST: 14 U/L — ABNORMAL LOW (ref 15–41)
Albumin: 3.1 g/dL — ABNORMAL LOW (ref 3.5–5.0)
Alkaline Phosphatase: 79 U/L (ref 38–126)
Anion gap: 9 (ref 5–15)
BUN: 9 mg/dL (ref 6–20)
CO2: 21 mmol/L — ABNORMAL LOW (ref 22–32)
Calcium: 8.8 mg/dL — ABNORMAL LOW (ref 8.9–10.3)
Chloride: 106 mmol/L (ref 98–111)
Creatinine, Ser: 0.67 mg/dL (ref 0.44–1.00)
GFR, Estimated: >60 mL/min (ref >60)
Glucose, Bld: 100 mg/dL — ABNORMAL HIGH (ref 70–99)
Potassium: 4 mmol/L (ref 3.5–5.1)
Sodium: 136 mmol/L (ref 135–145)
Total Bilirubin: 0.5 mg/dL (ref 0.3–1.2)
Total Protein: 6.5 g/dL (ref 6.5–8.1)

## 2022-10-08 LAB — CBC
HCT: 31.1 % — ABNORMAL LOW (ref 36.0–46.0)
Hemoglobin: 10 g/dL — ABNORMAL LOW (ref 12.0–15.0)
MCH: 28.6 pg (ref 26.0–34.0)
MCHC: 32.2 g/dL (ref 30.0–36.0)
MCV: 88.9 fL (ref 80.0–100.0)
Platelets: 193 10*3/uL (ref 150–400)
RBC: 3.5 MIL/uL — ABNORMAL LOW (ref 3.87–5.11)
RDW: 13.7 % (ref 11.5–15.5)
WBC: 8.3 10*3/uL (ref 4.0–10.5)
nRBC: 0 % (ref 0.0–0.2)

## 2022-10-08 LAB — PROTEIN / CREATININE RATIO, URINE
Creatinine, Urine: 232 mg/dL
Protein Creatinine Ratio: 0.08 mg/mg{Cre} (ref 0.00–0.15)
Total Protein, Urine: 18 mg/dL

## 2022-10-08 MED ORDER — ACETAMINOPHEN 500 MG PO TABS: 1000.0000 mg | ORAL_TABLET | Freq: Four times a day (QID) | ORAL | 0 refills | Status: DC | PRN

## 2022-10-08 MED ORDER — ACETAMINOPHEN 500 MG PO TABS
1000.0000 mg | ORAL_TABLET | Freq: Four times a day (QID) | ORAL | Status: DC | PRN
  Administered 2022-10-08: 1000 mg via ORAL
  Filled 2022-10-08: qty 2

## 2022-10-08 NOTE — OB Triage Note (Signed)
Patient is G2P1 [redacted]w[redacted]d is seen at [facility], was seen in ofice on Friday BP was stable, patient today c/o elevated BP from the visit on Friday stated it was 127/74. Patient c/o vomiting last night and had some braxton hicks for 5 minutes. FHR 151 good variability.

## 2022-10-08 NOTE — Discharge Summary (Signed)
Physician Final Progress Note  Patient ID: Brenda Mccoy MRN: [id number] DOB/AGE: [birth year] 27 y.o.  Admit date: 10/08/2022 Admitting provider: Linzie Collin, MD Discharge date: 10/08/2022   Admission Diagnoses:  1) intrauterine pregnancy at [redacted]w[redacted]d  2) headache/high blood pressure  3) decreased fetal movement   Discharge Diagnoses:  Principal Problem:   Headache in pregnancy  Headached resolved Normotensive  RNST   History of Present Illness: The patient is a 27 y.o. female G2P1001 at [redacted]w[redacted]d who presents for Headache, elevated blood pressure at home and decreased FM. Mashal  has had a headache since Friday. It is in the front and is throbbing, She took 500mg  of Tylenol, which helped. She has not taken any Tylenol since. She did check her BP at home, it was 127/74, compared to her last appointment this reading seemed high. She denies any Visual disturbances or RUQ pain. Yesterday she have some N/V and a few contractions. Currently she denies any nausea or contractions. She last felt FM around 9am, she tried to drink cold water and has eaten throughout the day without noticing any movement. She has a pretty relaxed day and has not been vary active. Since arriving to triage she has felt FM.   Past Medical History:  Diagnosis Date   Anemia     Past Surgical History:  Procedure Laterality Date   CESAREAN SECTION  09/05/2020   Procedure: CESAREAN SECTION;  Surgeon: Hildred Laser, MD;  Location: [facility] ORS;  Service: Obstetrics;;   ROOT CANAL     WISDOM TOOTH EXTRACTION     1    No current facility-administered medications on file prior to encounter.   Current Outpatient Medications on File Prior to Encounter  Medication Sig Dispense Refill   prenatal vitamin w/FE, FA (NATACHEW) 29-1 MG CHEW chewable tablet Chew 2 tablets by mouth daily at 12 noon.     Ferric Maltol (ACCRUFER) 30 MG CAPS Take 1 capsule (30 mg total) by mouth daily. 90 capsule 3    Allergies  Allergen  Reactions   Miconazole Itching    Social History   Socioeconomic History   Marital status: Single    Spouse name: Not on file   Number of children: 1   Years of education: 16   Highest education level: Not on file  Occupational History   Occupation: tech at [employer]  Tobacco Use   Smoking status: Never   Smokeless tobacco: Never  Vaping Use   Vaping Use: Never used  Substance and Sexual Activity   Alcohol use: Not Currently   Drug use: Never   Sexual activity: Yes    Partners: Male    Birth control/protection: None  Other Topics Concern   Not on file  Social History Narrative   Not on file   Social Determinants of Health   Financial Resource Strain: Low Risk  (05/26/2022)   Overall Financial Resource Strain (CARDIA)    Difficulty of Paying Living Expenses: Not very hard  Food Insecurity: No Food Insecurity (05/26/2022)   Hunger Vital Sign    Worried About Running Out of Food in the Last Year: Never true    Ran Out of Food in the Last Year: Never true  Transportation Needs: No Transportation Needs (05/26/2022)   PRAPARE - Administrator, Civil Service (Medical): No    Lack of Transportation (Non-Medical): No  Physical Activity: Inactive (05/26/2022)   Exercise Vital Sign    Days of Exercise per Week: 0 days    Minutes  of Exercise per Session: 0 min  Stress: No Stress Concern Present (05/26/2022)   [employer] of Occupational Health - Occupational Stress Questionnaire    Feeling of Stress : Not at all  Social Connections: Moderately Integrated (05/26/2022)   Social Connection and Isolation Panel [NHANES]    Frequency of Communication with Friends and Family: More than three times a week    Frequency of Social Gatherings with Friends and Family: Once a week    Attends Religious Services: More than 4 times per year    Active Member of [employer] or Organizations: No    Attends Banker Meetings: Never    Marital Status: Living with partner  Intimate Partner  Violence: Not At Risk (05/26/2022)   Humiliation, Afraid, Rape, and Kick questionnaire    Fear of Current or Ex-Partner: No    Emotionally Abused: No    Physically Abused: No    Sexually Abused: No    Family History  Problem Relation Age of Onset   Hypertension Mother    Healthy Father    Other Sister        HPV   Healthy Sister    Healthy Sister    Healthy Sister    Healthy Brother    Healthy Brother    Healthy Brother    Healthy Maternal Grandmother    Hypertension Maternal Grandfather    Cancer Paternal Grandmother    Healthy Paternal Grandmother      ROS see HPI   Physical Exam: BP 119/73   Pulse 94   Temp 98.3 F (36.8 C) (Oral)   Ht 5\' 4"  (1.626 m)   Wt 111.6 kg   LMP  (LMP Unknown)   BMI 42.23 kg/m   Physical Exam Constitutional:      Appearance: Normal appearance.  Cardiovascular:     Rate and Rhythm: Normal rate and regular rhythm.     Pulses: Normal pulses.     Heart sounds: Normal heart sounds.  Pulmonary:     Effort: Pulmonary effort is normal.     Breath sounds: Normal breath sounds.  Abdominal:     Tenderness: There is no abdominal tenderness.     Comments: Gravid   Musculoskeletal:     Cervical back: Normal range of motion and neck supple.     Comments: Trace BLE   Neurological:     General: No focal deficit present.     Mental Status: She is alert.     Deep Tendon Reflexes: Reflexes normal.  Psychiatric:        Mood and Affect: Mood normal.    EFM: baseline 135, moderate variability, pos accel, neg decel  TOCO: rare   Consults: None  Significant Findings/ Diagnostic Studies: labs: PIH labs WNL   Procedures: RNST   Hospital Course: The patient was admitted to Labor and Delivery Triage for observation. She was given 1,000mg  of Tylenol. Over time her headache improved. Her PIH labs were WNL. Her hemoglobin was 10.0, she has been taking an Iron supplement daily for about 3 weeks-will recheck cbc at next visit and then consider  increasing to BID. Her calcium was noted to be low, pt also mentioned she gets leg cramps at night-Will try a calcium-Magnesium supplement. Briefly reviewed preeclampsia, pt reassured that she does not have this-is aware of concerning signs and symptoms. Pt was discharge home and will keep next scheduled ROB.   Discharge Condition: stable  Disposition: Discharge disposition: 01-Home or Self Care  Diet: Regular diet  Discharge Activity: Activity as tolerated   Allergies as of 10/08/2022       Reactions   Miconazole Itching        Medication List     TAKE these medications    ACCRUFeR 30 MG Caps Generic drug: Ferric Maltol Take 1 capsule (30 mg total) by mouth daily.   acetaminophen 500 MG tablet Commonly known as: TYLENOL Take 2 tablets (1,000 mg total) by mouth every 6 (six) hours as needed for fever or headache.   prenatal vitamin w/FE, FA 29-1 MG Chew chewable tablet Chew 2 tablets by mouth daily at 12 noon.         Total time spent taking care of this patient: 20 minutes  Signed: Ellouise Newer [facility], CNM  10/08/2022, 11:39 PM

## 2022-10-20 ENCOUNTER — Ambulatory Visit (INDEPENDENT_AMBULATORY_CARE_PROVIDER_SITE_OTHER): Payer: BC Managed Care – PPO | Admitting: Licensed Practical Nurse

## 2022-10-20 ENCOUNTER — Encounter: Payer: Self-pay | Admitting: Licensed Practical Nurse

## 2022-10-20 VITALS — BP 98/53 | HR 79 | Wt 244.0 lb

## 2022-10-20 DIAGNOSIS — Z3A34 34 weeks gestation of pregnancy: Secondary | ICD-10-CM

## 2022-10-20 DIAGNOSIS — O0993 Supervision of high risk pregnancy, unspecified, third trimester: Secondary | ICD-10-CM

## 2022-10-20 LAB — POCT URINALYSIS DIPSTICK OB
Bilirubin, UA: NEGATIVE
Blood, UA: NEGATIVE
Glucose, UA: NEGATIVE
Ketones, UA: NEGATIVE
Leukocytes, UA: NEGATIVE
Nitrite, UA: NEGATIVE
POC,PROTEIN,UA: NEGATIVE
Spec Grav, UA: 1.02 (ref 1.010–1.025)
Urobilinogen, UA: 0.2 E.U./dL
pH, UA: 6.5 (ref 5.0–8.0)

## 2022-10-20 NOTE — Progress Notes (Signed)
Routine Prenatal Care Visit  Subjective  Brenda Mccoy is a 27 y.o. G2P1001 at [redacted]w[redacted]d being seen today for ongoing prenatal care.  She is currently monitored for the following issues for this high-risk pregnancy and has Prediabetes; Type B blood, Rh positive; Obesity in pregnancy; COVID-19; History of premature rupture of membranes; Cesarean delivery delivered; Non-reassuring fetal heart rate with late deceleration; Postpartum hemorrhage; Acute vaginitis; Encounter for routine child health examination without abnormal findings; STD exposure; Yeast vaginitis; Supervision of other normal pregnancy, antepartum; History of cesarean delivery affecting pregnancy; and Headache in pregnancy on their problem list.  ----------------------------------------------------------------------------------- Patient reports headache.-resolves with Tylenol, she did check her BP when she had the HA, it was 150 over "something" and then all repeats were lower. BP today 98/53.  Encouraged use of Magnesium oxide daily.  -family will watch her child while she is in the hospital -her mother and boyfriend will be her support at the hospital -Pt does not want an induction. She would prefer spontaneous labor, if labor does not occur spontaneously she would like a repeat c-section.   Will meet with MD to discuss -reviewed NST and 36 wk labs at next visit  -needs breast pump prescription -FMLA paperwork taken to the front desk by pt   Contractions: Not present. Vag. Bleeding: None.  Movement: Present. Leaking Fluid denies.  ----------------------------------------------------------------------------------- The following portions of the patient's history were reviewed and updated as appropriate: allergies, current medications, past family history, past medical history, past social history, past surgical history and problem list. Problem list updated.  Objective  Blood pressure (!) 98/53, pulse 79, weight 244 lb (110.7  kg). Pregravid weight 251 lb (113.9 kg) Total Weight Gain -7 lb (-3.175 kg) Urinalysis: Urine Protein    Urine Glucose    Fetal Status: Fetal Heart Rate (bpm): 140 Fundal Height: 36 cm Movement: Present     General:  Alert, oriented and cooperative. Patient is in no acute distress.  Skin: Skin is warm and dry. No rash noted.   Cardiovascular: Normal heart rate noted  Respiratory: Normal respiratory effort, no problems with respiration noted  Abdomen: Soft, gravid, appropriate for gestational age. Pain/Pressure: Absent     Pelvic:  Cervical exam deferred        Extremities: Normal range of motion.     Mental Status: Normal mood and affect. Normal behavior. Normal judgment and thought content.   Assessment   27 y.o. G2P1001 at [redacted]w[redacted]d by  11/29/2022, Date entered prior to episode creation presenting for routine prenatal visit  Plan   second Problems (from 05/26/22 to present)     Problem Noted Resolved   Supervision of other normal pregnancy, antepartum 05/26/2022 by Loran Senters, CMA No   Overview Addendum 10/08/2022  9:28 PM by Ellwood Sayers, CNM     Clinical Staff Provider  Office Location  [facility] Dating  5wkUS  Language  English Anatomy US  Normal female  Flu Vaccine  offer Genetic Screen  NIPS: neg  TDaP vaccine  10/06/22 Hgb A1C or  GTT Early :WNL Third trimester :   Covid declined   LAB RESULTS   Rhogam  B/Positive/-- (02/05 1555)  Blood Type B/Positive/-- (02/05 1555)   Feeding Plan breast Antibody Negative (02/05 1555)  Contraception undecided Rubella 13.60 (02/05 1555)  Circumcision yes RPR Non Reactive (02/05 1555)   Pediatrician  [facility] HBsAg Negative (02/05 1555)   Support Person Riley Lam, Mom HIV Non Reactive (02/05 1555)  Prenatal Classes no Varicella Immune  GBS  (For PCN allergy, check sensitivities)   BTL Consent  Hep C Non Reactive (02/05 1555)   VBAC Consent  Pap Diagnosis  Date Value Ref Range Status  10/13/2019   Final   - Negative for  intraepithelial lesion or malignancy (NILM)      Hgb Electro      CF      SMA                   Preterm labor symptoms and general obstetric precautions including but not limited to vaginal bleeding, contractions, leaking of fluid and fetal movement were reviewed in detail with the patient. Please refer to After Visit Summary for other counseling recommendations.   Return in about 2 weeks (around 11/03/2022) for rob/NST, needs to see MD, ROB.  Next growth [place] July 11  Carie Caddy, [place]   [facility]  10/20/22  5:38 PM

## 2022-10-24 ENCOUNTER — Encounter: Payer: Self-pay | Admitting: Obstetrics and Gynecology

## 2022-10-24 DIAGNOSIS — Z0289 Encounter for other administrative examinations: Secondary | ICD-10-CM

## 2022-11-02 ENCOUNTER — Encounter: Payer: Self-pay | Admitting: Obstetrics and Gynecology

## 2022-11-02 ENCOUNTER — Encounter: Payer: BC Managed Care – PPO | Admitting: Obstetrics

## 2022-11-02 ENCOUNTER — Ambulatory Visit
Admission: RE | Admit: 2022-11-02 | Discharge: 2022-11-02 | Disposition: A | Discharge status: Home / Self Care | Payer: BC Managed Care – PPO | Source: Ambulatory Visit | Attending: Obstetrics | Admitting: Obstetrics

## 2022-11-02 ENCOUNTER — Other Ambulatory Visit: Payer: BC Managed Care – PPO

## 2022-11-02 ENCOUNTER — Ambulatory Visit (INDEPENDENT_AMBULATORY_CARE_PROVIDER_SITE_OTHER): Payer: BC Managed Care – PPO | Admitting: Obstetrics and Gynecology

## 2022-11-02 VITALS — BP 118/67 | HR 85 | Wt 245.6 lb

## 2022-11-02 DIAGNOSIS — Z113 Encounter for screening for infections with a predominantly sexual mode of transmission: Secondary | ICD-10-CM

## 2022-11-02 DIAGNOSIS — E669 Obesity, unspecified: Secondary | ICD-10-CM

## 2022-11-02 DIAGNOSIS — Z01818 Encounter for other preprocedural examination: Secondary | ICD-10-CM

## 2022-11-02 DIAGNOSIS — Z8759 Personal history of other complications of pregnancy, childbirth and the puerperium: Secondary | ICD-10-CM

## 2022-11-02 DIAGNOSIS — O09293 Supervision of pregnancy with other poor reproductive or obstetric history, third trimester: Secondary | ICD-10-CM

## 2022-11-02 DIAGNOSIS — Z3A36 36 weeks gestation of pregnancy: Secondary | ICD-10-CM

## 2022-11-02 DIAGNOSIS — Z3480 Encounter for supervision of other normal pregnancy, unspecified trimester: Secondary | ICD-10-CM | POA: Insufficient documentation

## 2022-11-02 DIAGNOSIS — Z3687 Encounter for antenatal screening for uncertain dates: Secondary | ICD-10-CM | POA: Insufficient documentation

## 2022-11-02 DIAGNOSIS — O99213 Obesity complicating pregnancy, third trimester: Secondary | ICD-10-CM

## 2022-11-02 DIAGNOSIS — O99210 Obesity complicating pregnancy, unspecified trimester: Secondary | ICD-10-CM

## 2022-11-02 DIAGNOSIS — O0993 Supervision of high risk pregnancy, unspecified, third trimester: Secondary | ICD-10-CM

## 2022-11-02 DIAGNOSIS — Z3685 Encounter for antenatal screening for Streptococcus B: Secondary | ICD-10-CM

## 2022-11-02 DIAGNOSIS — O34219 Maternal care for unspecified type scar from previous cesarean delivery: Secondary | ICD-10-CM

## 2022-11-02 LAB — POCT URINALYSIS DIPSTICK OB
Bilirubin, UA: NEGATIVE
Blood, UA: NEGATIVE
Glucose, UA: NEGATIVE
Ketones, UA: NEGATIVE
Leukocytes, UA: NEGATIVE
Nitrite, UA: NEGATIVE
Spec Grav, UA: 1.015 (ref 1.010–1.025)
Urobilinogen, UA: 0.2 E.U./dL
pH, UA: 7 (ref 5.0–8.0)

## 2022-11-02 NOTE — Progress Notes (Signed)
ROB [redacted]w[redacted]d: She is doing well. She reports good fetal movement. She has no new concerns today. Cultures were done today.

## 2022-11-02 NOTE — Progress Notes (Signed)
ROB: Patient is a 27 y.o. G2P1001 at [redacted]w[redacted]d who presents for routine OB care.  She has the following on her problem list:: history of Prediabetes; ; Obesity in pregnancy; History of premature rupture of membranes;  Postpartum hemorrhage; Acute vaginitis;  Supervision of other normal pregnancy, antepartum; History of cesarean delivery affecting pregnancy; and Headache.   Patient denies complaints today.  Desires to discuss her birth plan.  Notes that she has previously been counseled on TOLAC however states that she does not desire to go past her due date.  Notes that if she makes it to her due date she would prefer to have a C-section rather than being induced and after going postdates.  I discussed risks and benefits of C-section with patient.  Preop done today for tentative date of 12/03/2022. week cultures performed. RTC in 1 week. Reviewed labor precautions.

## 2022-11-03 NOTE — Telephone Encounter (Signed)
The patient has called back. I advised the information to the patient. She is going to log into the my chart to respond to this message.

## 2022-11-04 LAB — STREP GP B NAA: Strep Gp B NAA: NEGATIVE

## 2022-11-05 LAB — GC/CHLAMYDIA PROBE AMP
Chlamydia trachomatis, NAA: NEGATIVE
Neisseria Gonorrhoeae by PCR: NEGATIVE

## 2022-11-09 ENCOUNTER — Ambulatory Visit (INDEPENDENT_AMBULATORY_CARE_PROVIDER_SITE_OTHER): Payer: BC Managed Care – PPO

## 2022-11-09 ENCOUNTER — Ambulatory Visit (INDEPENDENT_AMBULATORY_CARE_PROVIDER_SITE_OTHER): Payer: BC Managed Care – PPO | Admitting: Certified Nurse Midwife

## 2022-11-09 VITALS — BP 118/71 | HR 92 | Ht 64.0 in | Wt 247.5 lb

## 2022-11-09 VITALS — BP 118/71 | HR 92 | Wt 247.5 lb

## 2022-11-09 DIAGNOSIS — E669 Obesity, unspecified: Secondary | ICD-10-CM | POA: Diagnosis not present

## 2022-11-09 DIAGNOSIS — Z3A38 38 weeks gestation of pregnancy: Secondary | ICD-10-CM | POA: Diagnosis not present

## 2022-11-09 DIAGNOSIS — O99213 Obesity complicating pregnancy, third trimester: Secondary | ICD-10-CM

## 2022-11-09 DIAGNOSIS — Z3A37 37 weeks gestation of pregnancy: Secondary | ICD-10-CM

## 2022-11-09 NOTE — Progress Notes (Signed)
ROB doing well, NST for obesity. Reactive see nurse note. She denies any concerns today. She is feeling good movement. Follow up 1 wk for ROB and NST>   Doreene Burke, CNM

## 2022-11-09 NOTE — Patient Instructions (Signed)

## 2022-11-09 NOTE — Progress Notes (Unsigned)
    NURSE VISIT NOTE  Subjective:    Patient ID: Mordecai Maes, female    DOB: 03/27/1996, 27 y.o.   MRN: [id number]  HPI  Patient is a 27 y.o. G26P1001 female who presents for fetal monitoring per order from Hildred Laser, MD.   Objective:    LMP  (LMP Unknown)  Estimated Date of Delivery: 11/29/22  [redacted]w[redacted]d  Fetus A Non-Stress Test Interpretation for 11/09/22  Indication: Obesity            Assessment:   No diagnosis found.   Plan:   Results reviewed and discussed with patient by  Doreene Burke, CNM.     Rocco Serene, LPN

## 2022-11-10 ENCOUNTER — Encounter: Payer: Self-pay | Admitting: Certified Nurse Midwife

## 2022-11-14 ENCOUNTER — Other Ambulatory Visit: Payer: Self-pay

## 2022-11-14 ENCOUNTER — Encounter
Admission: RE | Admit: 2022-11-14 | Discharge: 2022-11-14 | Disposition: A | Discharge status: Home / Self Care | Payer: BC Managed Care – PPO | Source: Ambulatory Visit | Attending: Obstetrics and Gynecology | Admitting: Obstetrics and Gynecology

## 2022-11-14 HISTORY — DX: Anxiety disorder, unspecified: F41.9

## 2022-11-14 HISTORY — DX: Obesity complicating pregnancy, unspecified trimester: O99.210

## 2022-11-14 HISTORY — DX: Prediabetes: R73.03

## 2022-11-14 HISTORY — DX: Headache, unspecified: R51.9

## 2022-11-14 HISTORY — DX: Depression, unspecified: F32.A

## 2022-11-14 HISTORY — DX: Other specified postprocedural states: R11.2

## 2022-11-14 HISTORY — DX: Other specified postprocedural states: Z98.890

## 2022-11-14 HISTORY — DX: Other specified pregnancy related conditions, unspecified trimester: O26.899

## 2022-11-14 HISTORY — DX: Premature rupture of membranes, unspecified as to length of time between rupture and onset of labor, unspecified weeks of gestation: O42.90

## 2022-11-14 NOTE — Patient Instructions (Addendum)
Your procedure is scheduled on: 11/29/22 [facility]  Arrival Time: Please call Labor and Delivery the day before your scheduled C-Section to find out your arrival time. [phone].  Arrival: If your arrival time is prior to 6:00 am, please enter through the Emergency Room Entrance and you will be directed to Labor and Delivery. If your arrival time is 6:00 am or later, please enter the Medical Mall and follow the greeter's instructions.  REMEMBER: Instructions that are not followed completely may result in serious medical risk, up to and including death; or upon the discretion of your surgeon and anesthesiologist your surgery may need to be rescheduled.  Do not eat food or drink any liquids after midnight the night before surgery.  No gum chewing or hard candies.  One 11/23/22, week prior to surgery: Stop Anti-inflammatories (NSAIDS) such as Advil, Aleve, Ibuprofen, Motrin, Naproxen, Naprosyn and Aspirin based products such as Excedrin, Goody's Powder, BC Powder. You may however, continue to take Tylenol if needed for pain up until the day of surgery.  Stop ANY OVER THE COUNTER supplements until after surgery.   TAKE ONLY THESE MEDICATIONS THE MORNING OF SURGERY WITH A SIP OF WATER:  NONE    On the morning of surgery brush your teeth with toothpaste and water, you may rinse your mouth with mouthwash if you wish.Do not swallow any toothpaste or mouthwash.  Use CHG wipes as directed on instruction sheet.  Do not wear jewelry, make-up, hairpins, clips or nail polish.  Do not wear lotions, powders, or perfumes.   Do not shave body hair from the neck down 48 hours before surgery.  Contact lenses, hearing aids and dentures may not be worn into surgery.  Do not bring valuables to the hospital. [facility] is not responsible for any missing/lost belongings or valuables.   Notify your doctor if there is any change in your medical condition (cold, fever, infection).  Wear  comfortable clothing (specific to your surgery type) to the hospital.Your procedure is scheduled on: Report to the Registration Desk on the 1st floor of the Medical Mall. To find out your arrival time, please call [phone] between 1PM - 3PM on: If your arrival time is 6:00 am, do not arrive before that time as the Medical Mall entrance doors do not open until 6:00 am.  REMEMBER: Instructions that are not followed completely may result in serious medical risk, up to and including death; or upon the discretion of your surgeon and anesthesiologist your surgery may need to be rescheduled.  Do not eat food after midnight the night before surgery.  No gum chewing or hard candies.  You may however, drink CLEAR liquids up to 2 hours before you are scheduled to arrive for your surgery. Do not drink anything within 2 hours of your scheduled arrival time.  Clear liquids include: - water  - apple juice without pulp - gatorade (not RED colors) - black coffee or tea (Do NOT add milk or creamers to the coffee or tea) Do NOT drink anything that is not on this list.  Type 1 and Type 2 diabetics should only drink water.  In addition, your doctor has ordered for you to drink the provided:  Ensure Pre-Surgery Clear Carbohydrate Drink  Gatorade G2 Drinking this carbohydrate drink up to two hours before surgery helps to reduce insulin resistance and improve patient outcomes. Please complete drinking 2 hours before scheduled arrival time.  One week prior to surgery: Stop Anti-inflammatories (NSAIDS) such as Advil, Aleve,  Ibuprofen, Motrin, Naproxen, Naprosyn and Aspirin based products such as Excedrin, Goody's Powder, BC Powder.  Stop ANY OVER THE COUNTER supplements until after surgery. You may however, continue to take Tylenol if needed for pain up until the day of surgery.  TAKE ONLY THESE MEDICATIONS THE MORNING OF SURGERY WITH A SIP OF WATER:  NONE  No Alcohol for 24 hours before or after  surgery.  No Smoking including e-cigarettes for 24 hours before surgery.  No chewable tobacco products for at least 6 hours before surgery.  No nicotine patches on the day of surgery.  Do not use any "recreational" drugs for at least a week (preferably 2 weeks) before your surgery.  Please be advised that the combination of cocaine and anesthesia may have negative outcomes, up to and including death. If you test positive for cocaine, your surgery will be cancelled.  On the morning of surgery brush your teeth with toothpaste and water, you may rinse your mouth with mouthwash if you wish. Do not swallow any toothpaste or mouthwash.  Use CHG Soap or wipes as directed on instruction sheet.  Do not wear jewelry, make-up, hairpins, clips or nail polish.  Do not wear lotions, powders, or perfumes.   Do not shave body hair from the neck down 48 hours before surgery.  Contact lenses, hearing aids and dentures may not be worn into surgery.  Do not bring valuables to the hospital. [facility] is not responsible for any missing/lost belongings or valuables.   Total Shoulder Arthroplasty:  use Benzoyl Peroxide 5% Gel as directed on instruction sheet.  Bring your C-PAP to the hospital in case you may have to spend the night.   Notify your doctor if there is any change in your medical condition (cold, fever, infection).  Wear comfortable clothing (specific to your surgery type) to the hospital.  After surgery, you can help prevent lung complications by doing breathing exercises.  Take deep breaths and cough every 1-2 hours. Your doctor may order a device called an Incentive Spirometer to help you take deep breaths. When coughing or sneezing, hold a pillow firmly against your incision with both hands. This is called "splinting." Doing this helps protect your incision. It also decreases belly discomfort.  If you are being admitted to the hospital overnight, leave your suitcase in the car. After  surgery it may be brought to your room.  In case of increased patient census, it may be necessary for you, the patient, to continue your postoperative care in the Same Day Surgery department.  If you are being discharged the day of surgery, you will not be allowed to drive home. You will need a responsible individual to drive you home and stay with you for 24 hours after surgery.   If you are taking public transportation, you will need to have a responsible individual with you.  Please call the Pre-admissions Testing Dept. at [phone] if you have any questions about these instructions.  Surgery Visitation Policy:  Patients having surgery or a procedure may have two visitors.  Children under the age of 25 must have an adult with them who is not the patient.  Inpatient Visitation:    Visiting hours are 7 a.m. to 8 p.m. Up to four visitors are allowed at one time in a patient room. The visitors may rotate out with other people during the day.  One visitor age 49 or older may stay with the patient overnight and must be in the room by  8 p.m.  After surgery, you can help prevent lung complications by doing breathing exercises.  Take deep breaths and cough every 1-2 hours. Your doctor may order a device called an Incentive Spirometer to help you take deep breaths. When coughing or sneezing, hold a pillow firmly against your incision with both hands. This is called "splinting." Doing this helps protect your incision. It also decreases belly discomfort.  Please call the Pre-admissions Testing Dept. at [phone] if you have any questions about these instructions.  Surgery Visitation Policy:  Visitor Passes   All visitors, including children, need an identification sticker when visiting. These stickers must be worn where they can be seen.   Labor & Delivery  Laboring women may have one designated support person and two other visitors of any age visit. The support person must  remain the same. The visitors may switch with other visitors. Visitation is permitted 24 hours per day. The designated support person or a visitor over the age of 16 may sleep overnight in the patient's room. A doula registered with [facility] for labor and delivery support is not considered a visitor. Doulas not registered with [facility] are considered visitors.  Mother Baby Unit, OB Specialty and Gynecological Care  A designated support person and three visitors of any age may visit. The three visitors may switch out. The designated support person or a visitor age 32 or older may stay overnight in the room. During the postpartum period (up to 6 weeks), if the mother is the patient, she can have her newborn stay with her if there is another support person present who can be responsible for the baby.   Preparing the Skin Before Surgery     To help prevent the risk of infection at your surgical site, we are now providing you with rinse-free Sage 2% Chlorhexidine Gluconate (CHG) disposable wipes.  Chlorhexidine Gluconate (CHG) Soap  o An antiseptic cleaner that kills germs and bonds with the skin to continue killing germs even after washing  o Used for showering the night before surgery and morning of surgery  The night before surgery: Shower or bathe with warm water. Do not apply perfume, lotions, powders. Wait one hour after shower. Skin should be dry and cool. Open Sage wipe package - use 6 disposable cloths. Wipe body using one cloth for the right arm, one cloth for the left arm, one cloth for the right leg, one cloth for the left leg, one cloth for the chest/abdomen area, and one cloth for the back. Do not use on open wounds or sores. Do not use on face or genitals (private parts). If you are breast feeding, do not use on breasts. 5. Do not rinse, allow to dry. 6. Skin may feel "tacky" for several minutes. 7. Dress in clean clothes. 8. Place clean sheets on your bed and do  not sleep with pets.  REPEAT ABOVE ON THE MORNING OF SURGERY BEFORE ARRIVING TO THE HOSPITAL.  How to Use an Incentive Spirometer  An incentive spirometer is a tool that measures how well you are filling your lungs with each breath. Learning to take long, deep breaths using this tool can help you keep your lungs clear and active. This may help to reverse or lessen your chance of developing breathing (pulmonary) problems, especially infection. You may be asked to use a spirometer: After a surgery. If you have a lung problem or a history of smoking. After a long period of time when you have  been unable to move or be active. If the spirometer includes an indicator to show the highest number that you have reached, your health care provider or respiratory therapist will help you set a goal. Keep a log of your progress as told by your health care provider. What are the risks? Breathing too quickly may cause dizziness or cause you to pass out. Take your time so you do not get dizzy or light-headed. If you are in pain, you may need to take pain medicine before doing incentive spirometry. It is harder to take a deep breath if you are having pain. How to use your incentive spirometer  Sit up on the edge of your bed or on a chair. Hold the incentive spirometer so that it is in an upright position. Before you use the spirometer, breathe out normally. Place the mouthpiece in your mouth. Make sure your lips are closed tightly around it. Breathe in slowly and as deeply as you can through your mouth, causing the piston or the ball to rise toward the top of the chamber. Hold your breath for 3-5 seconds, or for as long as possible. If the spirometer includes a coach indicator, use this to guide you in breathing. Slow down your breathing if the indicator goes above the marked areas. Remove the mouthpiece from your mouth and breathe out normally. The piston or ball will return to the bottom of the chamber. Rest  for a few seconds, then repeat the steps 10 or more times. Take your time and take a few normal breaths between deep breaths so that you do not get dizzy or light-headed. Do this every 1-2 hours when you are awake. If the spirometer includes a goal marker to show the highest number you have reached (best effort), use this as a goal to work toward during each repetition. After each set of 10 deep breaths, cough a few times. This will help to make sure that your lungs are clear. If you have an incision on your chest or abdomen from surgery, place a pillow or a rolled-up towel firmly against the incision when you cough. This can help to reduce pain while taking deep breaths and coughing. General tips When you are able to get out of bed: Walk around often. Continue to take deep breaths and cough in order to clear your lungs. Keep using the incentive spirometer until your health care provider says it is okay to stop using it. If you have been in the hospital, you may be told to keep using the spirometer at home. Contact a health care provider if: You are having difficulty using the spirometer. You have trouble using the spirometer as often as instructed. Your pain medicine is not giving enough relief for you to use the spirometer as told. You have a fever. Get help right away if: You develop shortness of breath. You develop a cough with bloody mucus from the lungs. You have fluid or blood coming from an incision site after you cough. Summary An incentive spirometer is a tool that can help you learn to take long, deep breaths to keep your lungs clear and active. You may be asked to use a spirometer after a surgery, if you have a lung problem or a history of smoking, or if you have been inactive for a long period of time. Use your incentive spirometer as instructed every 1-2 hours while you are awake. If you have an incision on your chest or abdomen, place a pillow or  a rolled-up towel firmly  against your incision when you cough. This will help to reduce pain. Get help right away if you have shortness of breath, you cough up bloody mucus, or blood comes from your incision when you cough. This information is not intended to replace advice given to you by your health care provider. Make sure you discuss any questions you have with your health care provider. Document Revised: 06/30/2019 Document Reviewed: 06/30/2019 Elsevier Patient Education  2023 [employer].

## 2022-11-15 DIAGNOSIS — Z3A37 37 weeks gestation of pregnancy: Secondary | ICD-10-CM | POA: Insufficient documentation

## 2022-11-15 DIAGNOSIS — Z3A38 38 weeks gestation of pregnancy: Secondary | ICD-10-CM | POA: Insufficient documentation

## 2022-11-15 HISTORY — DX: 37 weeks gestation of pregnancy: Z3A.37

## 2022-11-15 HISTORY — DX: 38 weeks gestation of pregnancy: Z3A.38

## 2022-11-15 NOTE — Progress Notes (Signed)
    NURSE VISIT NOTE  Subjective:    Patient ID: Brenda Mccoy, female    DOB: November 22, 1995, 27 y.o.   MRN: [id number]  HPI  Patient is a 27 y.o. G10P2002 female who presents for fetal monitoring per order from Doreene Burke, CNM.   Objective:    BP 119/79   Pulse 96   Wt 251 lb 14.4 oz (114.3 kg)   LMP  (LMP Unknown)   BMI 43.24 kg/m  Estimated Date of Delivery: 11/29/22  [redacted]w[redacted]d  Fetus A Non-Stress Test Interpretation for 12/05/22  Indication: Obesity  Fetal Heart Rate A Mode: External Baseline Rate (A): 155 bpm Variability: Moderate Accelerations: 15 x 15 Decelerations: None Multiple birth?: No  Uterine Activity Mode: Toco Contraction Frequency (min): None  Interpretation (Fetal Testing) Nonstress Test Interpretation: Reactive Overall Impression: Reassuring for gestational age   Assessment:   1. Obesity affecting pregnancy in third trimester, unspecified obesity type   2. [redacted] weeks gestation of pregnancy      Plan:   Results reviewed and discussed with patient by  Hildred Laser, MD.     Rocco Serene, LPN

## 2022-11-15 NOTE — Patient Instructions (Signed)

## 2022-11-16 ENCOUNTER — Ambulatory Visit (INDEPENDENT_AMBULATORY_CARE_PROVIDER_SITE_OTHER): Payer: BC Managed Care – PPO | Admitting: Obstetrics and Gynecology

## 2022-11-16 ENCOUNTER — Ambulatory Visit (INDEPENDENT_AMBULATORY_CARE_PROVIDER_SITE_OTHER): Payer: BC Managed Care – PPO

## 2022-11-16 ENCOUNTER — Other Ambulatory Visit: Payer: BC Managed Care – PPO

## 2022-11-16 ENCOUNTER — Encounter: Payer: Self-pay | Admitting: Obstetrics and Gynecology

## 2022-11-16 VITALS — BP 119/79 | HR 96 | Wt 251.9 lb

## 2022-11-16 DIAGNOSIS — O0993 Supervision of high risk pregnancy, unspecified, third trimester: Secondary | ICD-10-CM

## 2022-11-16 DIAGNOSIS — E669 Obesity, unspecified: Secondary | ICD-10-CM

## 2022-11-16 DIAGNOSIS — Z3A38 38 weeks gestation of pregnancy: Secondary | ICD-10-CM

## 2022-11-16 DIAGNOSIS — O99213 Obesity complicating pregnancy, third trimester: Secondary | ICD-10-CM

## 2022-11-16 DIAGNOSIS — Z3480 Encounter for supervision of other normal pregnancy, unspecified trimester: Secondary | ICD-10-CM

## 2022-11-16 DIAGNOSIS — Z3A40 40 weeks gestation of pregnancy: Secondary | ICD-10-CM

## 2022-11-16 DIAGNOSIS — O480 Post-term pregnancy: Secondary | ICD-10-CM

## 2022-11-16 LAB — POCT URINALYSIS DIPSTICK OB
Bilirubin, UA: NEGATIVE
Blood, UA: NEGATIVE
Glucose, UA: NEGATIVE
Ketones, UA: NEGATIVE
Leukocytes, UA: NEGATIVE
Nitrite, UA: NEGATIVE
Spec Grav, UA: 1.015 (ref 1.010–1.025)
Urobilinogen, UA: 0.2 E.U./dL
pH, UA: 6.5 (ref 5.0–8.0)

## 2022-11-16 NOTE — Progress Notes (Signed)
ROB: Patient is a 27 y.o. G2P1001 at [redacted]w[redacted]d who presents for routine OB care.  Pregnancy is complicated by  Prediabetes; Obesity affecting pregnancy in third trimester; History of premature rupture of membranes; Postpartum hemorrhage; History of cesarean delivery affecting pregnancy; Headache in pregnancy.  Patient has complaints of pelvic pressure. Offered cervical exam which she agrees to. Given labor precautions, discussed natural cervical ripening agents.  NST performed today was reviewed and was found to be reactive.  Continue recommended antenatal testing and prenatal care.  Fetus A Non-Stress Test Interpretation for 11/16/22   Indication: Obesity   Fetal Heart Rate A Mode: External Baseline Rate (A): 155 bpm Variability: Moderate Accelerations: 15 x 15 Decelerations: None Multiple birth?: No   Uterine Activity Mode: Toco Contraction Frequency (min): None   Interpretation (Fetal Testing) Nonstress Test Interpretation: Reactive Overall Impression: Reassuring for gestational age

## 2022-11-16 NOTE — Progress Notes (Signed)
ROB 38.1: She is doing well. She has good fetal movement. NST today. No new concerns.

## 2022-11-16 NOTE — Patient Instructions (Signed)
Natural Cervical Ripening Supplements  Red Raspberry Leaf Tea 2-4 cups daily.  Start at [redacted] weeks gestation and continue until labor.     Evening Primrose Oil (1000 mg) Take twice daily - by mouth in the morning and vaginally at bedtime. (If previous C-section, take both doses by mouth) Start at 38 weeks and continue until labor    Blue Cohosh 1 capsule daily starting at [redacted] weeks gestation 2 capsules daily starting at [redacted] weeks gestation 3 capsules daily starting at [redacted] weeks gestation  

## 2022-11-18 ENCOUNTER — Other Ambulatory Visit: Payer: Self-pay

## 2022-11-18 ENCOUNTER — Observation Stay
Admission: EM | Admit: 2022-11-18 | Discharge: 2022-11-18 | Disposition: A | Discharge status: Home / Self Care | Payer: BC Managed Care – PPO | Attending: Obstetrics | Admitting: Obstetrics

## 2022-11-18 DIAGNOSIS — O26893 Other specified pregnancy related conditions, third trimester: Secondary | ICD-10-CM

## 2022-11-18 DIAGNOSIS — O368330 Maternal care for abnormalities of the fetal heart rate or rhythm, third trimester, not applicable or unspecified: Secondary | ICD-10-CM | POA: Diagnosis not present

## 2022-11-18 DIAGNOSIS — R109 Unspecified abdominal pain: Secondary | ICD-10-CM

## 2022-11-18 DIAGNOSIS — O471 False labor at or after 37 completed weeks of gestation: Principal | ICD-10-CM | POA: Insufficient documentation

## 2022-11-18 DIAGNOSIS — Z3A38 38 weeks gestation of pregnancy: Secondary | ICD-10-CM

## 2022-11-18 MED ORDER — ACETAMINOPHEN 325 MG PO TABS: 650.0000 mg | ORAL_TABLET | ORAL | Status: DC | PRN

## 2022-11-18 NOTE — OB Triage Note (Signed)
LABOR & DELIVERY OB TRIAGE NOTE  SUBJECTIVE  HPI Brenda Mccoy is a 27 y.o. G2P1001 at [redacted]w[redacted]d who presents to Labor & Delivery for contractions. She reports that she has been having regular contractions, and they got stronger around 1900. She is still able to talk through them. She denies LOF and vaginal bleeding. She endorses good fetal movement. She has a h/o previous CS and plans a TOLAC if she labors spontaneously before her due date.  OB History     Gravida  2   Para  1   Term  1   Preterm      AB      Living  1      SAB      IAB      Ectopic      Multiple  0   Live Births  1           Scheduled Meds: Continuous Infusions: PRN Meds:.acetaminophen  OBJECTIVE  LMP  (LMP Unknown)    Abdomen: soft, gravid, non-tender Cervical exam: Dilation: Fingertip Effacement (%): 50 Exam by:: Quitman Livings CNM   NST I reviewed the NST and it was reactive.  Baseline: 140 Variability: moderate Accelerations: present Decelerations:none Toco: irregular Category 1  ASSESSMENT Impression  1) Pregnancy at G2P1001, [redacted]w[redacted]d, Estimated Date of Delivery: 11/29/22 2) Reassuring maternal/fetal status 3) Not in labor  PLAN  1) Discharge home with standard return/labor precautions 2) Keep scheduled ROB appts 3) Encouraged rest, hydration, warm bath, Tylenol PM   Guadlupe Spanish, CNM 11/18/22  4:04 AM

## 2022-11-18 NOTE — OB Triage Note (Signed)
Mordecai Maes 26 y.o.  presents to Labor & Delivery triage via wheelchair steered by ED staff reporting contractions every 2-3 min. . She denies signs and symptoms consistent with rupture of membranes or active vaginal bleeding. She states positive fetal movement. External FM and TOCO applied to non-tender abdomen. Initial FHR 135. Vital signs obtained and within normal limits. Patient oriented to care environment including call bell and bed control use. Guadlupe Spanish, CNM notified of patient's arrival.

## 2022-11-20 ENCOUNTER — Encounter: Payer: Self-pay | Admitting: Obstetrics and Gynecology

## 2022-11-22 DIAGNOSIS — Z3A39 39 weeks gestation of pregnancy: Secondary | ICD-10-CM | POA: Insufficient documentation

## 2022-11-22 NOTE — Progress Notes (Signed)
    NURSE VISIT NOTE  Subjective:    Patient ID: Brenda Mccoy, female    DOB: 12/02/1995, 27 y.o.   MRN: [id number]  HPI  Patient is a 27 y.o. G8P1001 female who presents for fetal monitoring per order from Hildred Laser, MD.   Objective:    BP 116/76   Pulse 89   Ht 5\' 4"  (1.626 m)   Wt 252 lb 6.4 oz (114.5 kg)   LMP  (LMP Unknown)   BMI 43.32 kg/m  Estimated Date of Delivery: 11/29/22  [redacted]w[redacted]d  Fetus A Non-Stress Test Interpretation for 11/24/22  Indication: Obesity  Fetal Heart Rate A Mode: External Baseline Rate (A): 145 bpm Variability: Moderate Accelerations: 15 x 15 Decelerations: Late Multiple birth?: No  Uterine Activity Mode: Toco Contraction Frequency (min): x1 Contraction Duration (sec): 120 Contraction Quality: Mild Resting Time: Adequate  Interpretation (Fetal Testing) Nonstress Test Interpretation: Reactive Overall Impression: Reassuring for gestational age   Assessment:   1. Obesity affecting pregnancy in third trimester, unspecified obesity type   2. [redacted] weeks gestation of pregnancy      Plan:   Results reviewed and discussed with patient by  Guadlupe Spanish, CNM.     Rocco Serene, LPN

## 2022-11-22 NOTE — Patient Instructions (Signed)

## 2022-11-23 ENCOUNTER — Ambulatory Visit (INDEPENDENT_AMBULATORY_CARE_PROVIDER_SITE_OTHER): Payer: BC Managed Care – PPO | Admitting: Obstetrics

## 2022-11-23 ENCOUNTER — Other Ambulatory Visit: Payer: BC Managed Care – PPO

## 2022-11-23 ENCOUNTER — Encounter: Payer: Self-pay | Admitting: Obstetrics

## 2022-11-23 ENCOUNTER — Ambulatory Visit (INDEPENDENT_AMBULATORY_CARE_PROVIDER_SITE_OTHER): Payer: BC Managed Care – PPO

## 2022-11-23 VITALS — BP 116/76 | HR 89 | Ht 64.0 in | Wt 252.4 lb

## 2022-11-23 VITALS — BP 116/76 | HR 89 | Wt 252.4 lb

## 2022-11-23 DIAGNOSIS — Z3A39 39 weeks gestation of pregnancy: Secondary | ICD-10-CM

## 2022-11-23 DIAGNOSIS — Z3480 Encounter for supervision of other normal pregnancy, unspecified trimester: Secondary | ICD-10-CM

## 2022-11-23 DIAGNOSIS — E669 Obesity, unspecified: Secondary | ICD-10-CM

## 2022-11-23 DIAGNOSIS — O99213 Obesity complicating pregnancy, third trimester: Secondary | ICD-10-CM

## 2022-11-23 LAB — POCT URINALYSIS DIPSTICK OB
Bilirubin, UA: NEGATIVE
Blood, UA: NEGATIVE
Glucose, UA: NEGATIVE
Ketones, UA: NEGATIVE
Leukocytes, UA: NEGATIVE
Nitrite, UA: NEGATIVE
POC,PROTEIN,UA: NEGATIVE
Spec Grav, UA: 1.02 (ref 1.010–1.025)
Urobilinogen, UA: 0.2 E.U./dL
pH, UA: 6.5 (ref 5.0–8.0)

## 2022-11-23 NOTE — Assessment & Plan Note (Addendum)
-  Reviewed s/s of labor and when to go to the hospital. Reviewed ways to encourage labor -Discussed scheduled cesarean routines if no labor before 11/29/22 -RNST today -Baby feels DOP by [employer] given and explained

## 2022-11-23 NOTE — Progress Notes (Signed)
    Return Prenatal Note   Assessment/Plan   Plan  27 y.o. G2P1001 at [redacted]w[redacted]d presents for follow-up OB visit. Reviewed prenatal record including previous visit note.  Supervision of other normal pregnancy, antepartum -Reviewed s/s of labor and when to go to the hospital. Reviewed ways to encourage labor -Discussed scheduled cesarean routines if no labor before 11/29/22 -RNST today -Baby feels DOP by [employer] given and explained   Orders Placed This Encounter  Procedures   POC Urinalysis Dipstick OB   No follow-ups on file.   Future Appointments  Date Time Provider Department Center  11/27/2022  9:00 AM [facility] None  12/06/2022 10:55 AM Hildred Laser, MD [facility] None    For next visit:   CS scheduled for 11/29/22 if no spontaneous labor before     Subjective   26 y.o. G2P1001 at [redacted]w[redacted]d presents for this follow-up prenatal visit.  Brenda Mccoy is feeling well. She continues to have BH contractions but so signs of labor yet. Baby is moving well. She is feeling at peace with either a vaginal or cesarean birth.  Movement: Present Contractions: Irregular  Objective   Flow sheet Vitals: Pulse Rate: 89 BP: 116/76 Total weight gain: 1 lb 6.4 oz (0.635 kg)  General Appearance  No acute distress, well appearing, and well nourished Pulmonary   Normal work of breathing Neurologic   Alert and oriented to person, place, and time Psychiatric   Mood and affect within normal limits  Brenda Mccoy, CNM 11/23/22 5:05 PM

## 2022-11-24 ENCOUNTER — Encounter: Payer: Self-pay | Admitting: Obstetrics and Gynecology

## 2022-11-24 NOTE — Telephone Encounter (Signed)
Spoke with patient. She states speaking with the midwife at [facility] and is going to monitor herself at home and go to the hospital when she feels necessary.

## 2022-11-24 NOTE — Telephone Encounter (Signed)
Attempted to call patient to advise going to L&D. LVM.

## 2022-11-27 ENCOUNTER — Encounter
Admission: RE | Admit: 2022-11-27 | Discharge: 2022-11-27 | Disposition: A | Discharge status: Home / Self Care | Payer: BC Managed Care – PPO | Source: Ambulatory Visit | Attending: Obstetrics and Gynecology | Admitting: Obstetrics and Gynecology

## 2022-11-27 ENCOUNTER — Encounter: Payer: Self-pay | Admitting: Obstetrics

## 2022-11-27 DIAGNOSIS — Z01818 Encounter for other preprocedural examination: Secondary | ICD-10-CM

## 2022-11-27 LAB — CBC
HCT: 34.5 % — ABNORMAL LOW (ref 36.0–46.0)
Hemoglobin: 11.7 g/dL — ABNORMAL LOW (ref 12.0–15.0)
MCH: 28.7 pg (ref 26.0–34.0)
MCHC: 33.9 g/dL (ref 30.0–36.0)
MCV: 84.6 fL (ref 80.0–100.0)
Platelets: 169 10*3/uL (ref 150–400)
RBC: 4.08 MIL/uL (ref 3.87–5.11)
RDW: 14.4 % (ref 11.5–15.5)
WBC: 5.7 10*3/uL (ref 4.0–10.5)
nRBC: 0 % (ref 0.0–0.2)

## 2022-11-27 LAB — TYPE AND SCREEN
ABO/RH(D): B POS
Antibody Screen: NEGATIVE
Extend sample reason: UNDETERMINED

## 2022-11-29 ENCOUNTER — Inpatient Hospital Stay
Admission: RE | Admit: 2022-11-29 | Discharge: 2022-12-01 | DRG: 788 | Disposition: A | Discharge status: Home / Self Care | Payer: BC Managed Care – PPO | Attending: Obstetrics and Gynecology | Admitting: Obstetrics and Gynecology

## 2022-11-29 ENCOUNTER — Other Ambulatory Visit: Payer: Self-pay

## 2022-11-29 ENCOUNTER — Inpatient Hospital Stay: Payer: BC Managed Care – PPO | Admitting: Certified Registered"

## 2022-11-29 ENCOUNTER — Encounter: Payer: Self-pay | Admitting: Obstetrics and Gynecology

## 2022-11-29 ENCOUNTER — Inpatient Hospital Stay: Payer: BC Managed Care – PPO | Admitting: Urgent Care

## 2022-11-29 ENCOUNTER — Other Ambulatory Visit: Payer: BC Managed Care – PPO

## 2022-11-29 ENCOUNTER — Encounter
Admission: RE | Disposition: A | Discharge status: Home / Self Care | Payer: Self-pay | Source: Home / Self Care | Attending: Obstetrics and Gynecology

## 2022-11-29 DIAGNOSIS — Z8249 Family history of ischemic heart disease and other diseases of the circulatory system: Secondary | ICD-10-CM | POA: Diagnosis not present

## 2022-11-29 DIAGNOSIS — Z3480 Encounter for supervision of other normal pregnancy, unspecified trimester: Principal | ICD-10-CM

## 2022-11-29 DIAGNOSIS — D62 Acute posthemorrhagic anemia: Secondary | ICD-10-CM | POA: Diagnosis not present

## 2022-11-29 DIAGNOSIS — O99213 Obesity complicating pregnancy, third trimester: Secondary | ICD-10-CM | POA: Diagnosis present

## 2022-11-29 DIAGNOSIS — O34211 Maternal care for low transverse scar from previous cesarean delivery: Secondary | ICD-10-CM | POA: Diagnosis present

## 2022-11-29 DIAGNOSIS — O09299 Supervision of pregnancy with other poor reproductive or obstetric history, unspecified trimester: Secondary | ICD-10-CM

## 2022-11-29 DIAGNOSIS — O9903 Anemia complicating the puerperium: Secondary | ICD-10-CM

## 2022-11-29 DIAGNOSIS — Z8616 Personal history of COVID-19: Secondary | ICD-10-CM | POA: Diagnosis not present

## 2022-11-29 DIAGNOSIS — Z3A40 40 weeks gestation of pregnancy: Secondary | ICD-10-CM | POA: Diagnosis not present

## 2022-11-29 DIAGNOSIS — O99214 Obesity complicating childbirth: Secondary | ICD-10-CM

## 2022-11-29 DIAGNOSIS — O480 Post-term pregnancy: Secondary | ICD-10-CM

## 2022-11-29 DIAGNOSIS — O34219 Maternal care for unspecified type scar from previous cesarean delivery: Secondary | ICD-10-CM | POA: Diagnosis present

## 2022-11-29 SURGERY — Surgical Case
Anesthesia: Spinal

## 2022-11-29 MED ORDER — HYDROCODONE-ACETAMINOPHEN 5-325 MG PO TABS
1.0000 | ORAL_TABLET | ORAL | Status: DC | PRN
  Filled 2022-11-29: qty 1

## 2022-11-29 MED ORDER — PHENYLEPHRINE HCL (PRESSORS) 10 MG/ML IV SOLN
INTRAVENOUS | Status: DC | PRN
  Administered 2022-11-29 (×2): 160 ug via INTRAVENOUS
  Administered 2022-11-29: 80 ug via INTRAVENOUS

## 2022-11-29 MED ORDER — FENTANYL CITRATE (PF) 100 MCG/2ML IJ SOLN
INTRAMUSCULAR | Status: AC
  Filled 2022-11-29: qty 2

## 2022-11-29 MED ORDER — PRENATAL MULTIVITAMIN CH
1.0000 | ORAL_TABLET | Freq: Every day | ORAL | Status: DC
  Administered 2022-11-30 – 2022-12-01 (×2): 1 via ORAL
  Filled 2022-11-29 (×2): qty 1

## 2022-11-29 MED ORDER — PHENYLEPHRINE HCL (PRESSORS) 10 MG/ML IV SOLN
INTRAVENOUS | Status: AC
  Filled 2022-11-29: qty 1

## 2022-11-29 MED ORDER — GABAPENTIN 300 MG PO CAPS
300.0000 mg | ORAL_CAPSULE | ORAL | Status: AC
  Administered 2022-11-29: 300 mg via ORAL
  Filled 2022-11-29: qty 1

## 2022-11-29 MED ORDER — ONDANSETRON HCL 4 MG/2ML IJ SOLN
INTRAMUSCULAR | Status: DC | PRN
  Administered 2022-11-29: 4 mg via INTRAVENOUS

## 2022-11-29 MED ORDER — KETOROLAC TROMETHAMINE 30 MG/ML IJ SOLN
30.0000 mg | Freq: Four times a day (QID) | INTRAMUSCULAR | Status: AC
  Administered 2022-11-29 – 2022-11-30 (×4): 30 mg via INTRAVENOUS
  Filled 2022-11-29 (×3): qty 1

## 2022-11-29 MED ORDER — TRAMADOL HCL 50 MG PO TABS: 50.0000 mg | ORAL_TABLET | Freq: Four times a day (QID) | ORAL | Status: DC | PRN

## 2022-11-29 MED ORDER — OXYTOCIN-SODIUM CHLORIDE 30-0.9 UT/500ML-% IV SOLN
INTRAVENOUS | Status: AC
  Filled 2022-11-29: qty 500

## 2022-11-29 MED ORDER — ONDANSETRON HCL 4 MG/2ML IJ SOLN
INTRAMUSCULAR | Status: AC
  Filled 2022-11-29: qty 2

## 2022-11-29 MED ORDER — LIDOCAINE 5 % EX PTCH
MEDICATED_PATCH | CUTANEOUS | Status: AC
  Filled 2022-11-29: qty 1

## 2022-11-29 MED ORDER — MEPERIDINE HCL 25 MG/ML IJ SOLN: 6.2500 mg | INTRAMUSCULAR | Status: DC | PRN

## 2022-11-29 MED ORDER — BUPIVACAINE IN DEXTROSE 0.75-8.25 % IT SOLN
INTRATHECAL | Status: DC | PRN
  Administered 2022-11-29: 1.4 mL via INTRATHECAL

## 2022-11-29 MED ORDER — LACTATED RINGERS IV SOLN: INTRAVENOUS | Status: DC

## 2022-11-29 MED ORDER — SODIUM CHLORIDE 0.9% FLUSH: 3.0000 mL | INTRAVENOUS | Status: DC | PRN

## 2022-11-29 MED ORDER — PHENYLEPHRINE HCL-NACL 20-0.9 MG/250ML-% IV SOLN
INTRAVENOUS | Status: DC | PRN
  Administered 2022-11-29: 40 ug/min via INTRAVENOUS

## 2022-11-29 MED ORDER — CHLORHEXIDINE GLUCONATE 0.12 % MT SOLN
15.0000 mL | Freq: Once | OROMUCOSAL | Status: AC
  Administered 2022-11-29: 15 mL via OROMUCOSAL
  Filled 2022-11-29: qty 15

## 2022-11-29 MED ORDER — DEXAMETHASONE SODIUM PHOSPHATE 10 MG/ML IJ SOLN
INTRAMUSCULAR | Status: AC
  Filled 2022-11-29: qty 1

## 2022-11-29 MED ORDER — DIPHENHYDRAMINE HCL 25 MG PO CAPS: 25.0000 mg | ORAL_CAPSULE | Freq: Four times a day (QID) | ORAL | Status: DC | PRN

## 2022-11-29 MED ORDER — DIPHENHYDRAMINE HCL 25 MG PO CAPS: 25.0000 mg | ORAL_CAPSULE | ORAL | Status: DC | PRN

## 2022-11-29 MED ORDER — ORAL CARE MOUTH RINSE: 15.0000 mL | Freq: Once | OROMUCOSAL | Status: AC

## 2022-11-29 MED ORDER — WITCH HAZEL-GLYCERIN EX PADS: 1.0000 | MEDICATED_PAD | CUTANEOUS | Status: DC | PRN

## 2022-11-29 MED ORDER — SIMETHICONE 80 MG PO CHEW
80.0000 mg | CHEWABLE_TABLET | Freq: Three times a day (TID) | ORAL | Status: DC
  Administered 2022-11-29 – 2022-12-01 (×5): 80 mg via ORAL
  Filled 2022-11-29 (×5): qty 1

## 2022-11-29 MED ORDER — LACTATED RINGERS IV BOLUS
500.0000 mL | Freq: Once | INTRAVENOUS | Status: AC
  Administered 2022-11-29: 500 mL via INTRAVENOUS

## 2022-11-29 MED ORDER — OXYCODONE HCL 5 MG PO TABS
5.0000 mg | ORAL_TABLET | Freq: Four times a day (QID) | ORAL | Status: DC | PRN
  Administered 2022-11-29 – 2022-11-30 (×2): 5 mg via ORAL
  Filled 2022-11-29 (×2): qty 1

## 2022-11-29 MED ORDER — SIMETHICONE 80 MG PO CHEW
80.0000 mg | CHEWABLE_TABLET | ORAL | Status: DC | PRN
  Administered 2022-12-01: 80 mg via ORAL
  Filled 2022-11-29: qty 1

## 2022-11-29 MED ORDER — KETOROLAC TROMETHAMINE 30 MG/ML IJ SOLN: 30.0000 mg | Freq: Four times a day (QID) | INTRAMUSCULAR | Status: AC | PRN

## 2022-11-29 MED ORDER — GABAPENTIN 100 MG PO CAPS
100.0000 mg | ORAL_CAPSULE | Freq: Three times a day (TID) | ORAL | Status: DC
  Administered 2022-11-29 – 2022-12-01 (×6): 100 mg via ORAL
  Filled 2022-11-29 (×6): qty 1

## 2022-11-29 MED ORDER — NALOXONE HCL 4 MG/10ML IJ SOLN: 1.0000 ug/kg/h | INTRAVENOUS | Status: DC | PRN

## 2022-11-29 MED ORDER — KETOROLAC TROMETHAMINE 30 MG/ML IJ SOLN
INTRAMUSCULAR | Status: DC | PRN
  Administered 2022-11-29: 30 mg via INTRAVENOUS

## 2022-11-29 MED ORDER — SOD CITRATE-CITRIC ACID 500-334 MG/5ML PO SOLN: 30.0000 mL | ORAL | Status: DC

## 2022-11-29 MED ORDER — MENTHOL 3 MG MT LOZG: 1.0000 | LOZENGE | OROMUCOSAL | Status: DC | PRN

## 2022-11-29 MED ORDER — COCONUT OIL OIL
1.0000 | TOPICAL_OIL | Status: DC | PRN
  Filled 2022-11-29: qty 7.5

## 2022-11-29 MED ORDER — FAMOTIDINE 20 MG PO TABS
20.0000 mg | ORAL_TABLET | Freq: Once | ORAL | Status: AC
  Administered 2022-11-29: 20 mg via ORAL
  Filled 2022-11-29: qty 1

## 2022-11-29 MED ORDER — FENTANYL CITRATE (PF) 100 MCG/2ML IJ SOLN
INTRAMUSCULAR | Status: DC | PRN
  Administered 2022-11-29: 15 ug via INTRAVENOUS

## 2022-11-29 MED ORDER — ZOLPIDEM TARTRATE 5 MG PO TABS: 5.0000 mg | ORAL_TABLET | Freq: Every evening | ORAL | Status: DC | PRN

## 2022-11-29 MED ORDER — NALOXONE HCL 0.4 MG/ML IJ SOLN: 0.4000 mg | INTRAMUSCULAR | Status: DC | PRN

## 2022-11-29 MED ORDER — CEFAZOLIN SODIUM-DEXTROSE 2-4 GM/100ML-% IV SOLN
2.0000 g | INTRAVENOUS | Status: AC
  Administered 2022-11-29: 2 g via INTRAVENOUS
  Filled 2022-11-29: qty 100

## 2022-11-29 MED ORDER — IBUPROFEN 600 MG PO TABS
600.0000 mg | ORAL_TABLET | Freq: Four times a day (QID) | ORAL | Status: DC
  Administered 2022-11-30 – 2022-12-01 (×4): 600 mg via ORAL
  Filled 2022-11-29 (×4): qty 1

## 2022-11-29 MED ORDER — ACETAMINOPHEN 500 MG PO TABS
1000.0000 mg | ORAL_TABLET | ORAL | Status: AC
  Administered 2022-11-29: 1000 mg via ORAL
  Filled 2022-11-29: qty 2

## 2022-11-29 MED ORDER — LIDOCAINE 5 % EX PTCH
MEDICATED_PATCH | CUTANEOUS | Status: DC | PRN
  Administered 2022-11-29: 1 via TRANSDERMAL

## 2022-11-29 MED ORDER — EPHEDRINE SULFATE-NACL 50-0.9 MG/10ML-% IV SOSY
PREFILLED_SYRINGE | INTRAVENOUS | Status: DC | PRN
  Administered 2022-11-29 (×2): 10 mg via INTRAVENOUS

## 2022-11-29 MED ORDER — POVIDONE-IODINE 10 % EX SWAB
2.0000 | Freq: Once | CUTANEOUS | Status: AC
  Administered 2022-11-29: 2 via TOPICAL

## 2022-11-29 MED ORDER — MORPHINE SULFATE (PF) 0.5 MG/ML IJ SOLN
INTRAMUSCULAR | Status: AC
  Filled 2022-11-29: qty 10

## 2022-11-29 MED ORDER — DIBUCAINE (PERIANAL) 1 % EX OINT: 1.0000 | TOPICAL_OINTMENT | CUTANEOUS | Status: DC | PRN

## 2022-11-29 MED ORDER — MORPHINE SULFATE (PF) 0.5 MG/ML IJ SOLN
INTRAMUSCULAR | Status: DC | PRN
  Administered 2022-11-29: .1 mg via EPIDURAL

## 2022-11-29 MED ORDER — ONDANSETRON HCL 4 MG/2ML IJ SOLN
4.0000 mg | Freq: Three times a day (TID) | INTRAMUSCULAR | Status: DC | PRN
  Administered 2022-11-29: 4 mg via INTRAVENOUS
  Filled 2022-11-29: qty 2

## 2022-11-29 MED ORDER — DIPHENHYDRAMINE HCL 50 MG/ML IJ SOLN: 12.5000 mg | INTRAMUSCULAR | Status: DC | PRN

## 2022-11-29 MED ORDER — KETOROLAC TROMETHAMINE 30 MG/ML IJ SOLN
INTRAMUSCULAR | Status: AC
  Filled 2022-11-29: qty 1

## 2022-11-29 MED ORDER — SENNOSIDES-DOCUSATE SODIUM 8.6-50 MG PO TABS
2.0000 | ORAL_TABLET | Freq: Every day | ORAL | Status: DC
  Administered 2022-11-30 – 2022-12-01 (×2): 2 via ORAL
  Filled 2022-11-29 (×2): qty 2

## 2022-11-29 MED ORDER — OXYTOCIN-SODIUM CHLORIDE 30-0.9 UT/500ML-% IV SOLN
2.5000 [IU]/h | INTRAVENOUS | Status: AC
  Administered 2022-11-29: 2.5 [IU]/h via INTRAVENOUS

## 2022-11-29 MED ORDER — DEXAMETHASONE SODIUM PHOSPHATE 10 MG/ML IJ SOLN
INTRAMUSCULAR | Status: DC | PRN
  Administered 2022-11-29: 10 mg via INTRAVENOUS

## 2022-11-29 MED ORDER — SCOPOLAMINE 1 MG/3DAYS TD PT72
1.0000 | MEDICATED_PATCH | Freq: Once | TRANSDERMAL | Status: DC
  Administered 2022-11-29: 1.5 mg via TRANSDERMAL
  Filled 2022-11-29: qty 1

## 2022-11-29 MED ORDER — KETOROLAC TROMETHAMINE 30 MG/ML IJ SOLN
30.0000 mg | Freq: Four times a day (QID) | INTRAMUSCULAR | Status: AC | PRN
  Filled 2022-11-29: qty 1

## 2022-11-29 MED ORDER — OXYTOCIN-SODIUM CHLORIDE 30-0.9 UT/500ML-% IV SOLN
INTRAVENOUS | Status: DC | PRN
  Administered 2022-11-29: 500 mL via INTRAVENOUS

## 2022-11-29 MED ORDER — ACETAMINOPHEN 500 MG PO TABS
1000.0000 mg | ORAL_TABLET | Freq: Four times a day (QID) | ORAL | Status: AC
  Administered 2022-11-29 – 2022-11-30 (×4): 1000 mg via ORAL
  Filled 2022-11-29 (×4): qty 2

## 2022-11-29 SURGICAL SUPPLY — 31 items
APL PRP STRL LF DISP 70% ISPRP (MISCELLANEOUS) ×2
BAG COUNTER SPONGE SURGICOUNT (BAG) ×1 IMPLANT
BAG SPNG CNTER NS LX DISP (BAG) ×1
CHLORAPREP W/TINT 26 (MISCELLANEOUS) ×2 IMPLANT
DRESSING TELFA 8X10 (GAUZE/BANDAGES/DRESSINGS) IMPLANT
DRSG TELFA 3X8 NADH STRL (GAUZE/BANDAGES/DRESSINGS) ×1 IMPLANT
ELECT REM PT RETURN 9FT ADLT (ELECTROSURGICAL) ×1
ELECTRODE REM PT RTRN 9FT ADLT (ELECTROSURGICAL) ×1 IMPLANT
EXTRT SYSTEM ALEXIS 17CM (MISCELLANEOUS)
GAUZE SPONGE 4X4 12PLY STRL (GAUZE/BANDAGES/DRESSINGS) ×1 IMPLANT
GLOVE BIO SURGEON STRL SZ 6.5 (GLOVE) ×1 IMPLANT
GLOVE INDICATOR 7.0 STRL GRN (GLOVE) ×1 IMPLANT
GOWN STRL REUS W/ TWL LRG LVL3 (GOWN DISPOSABLE) ×2 IMPLANT
GOWN STRL REUS W/TWL LRG LVL3 (GOWN DISPOSABLE) ×2
KIT TURNOVER KIT A (KITS) ×1 IMPLANT
MANIFOLD NEPTUNE II (INSTRUMENTS) ×1 IMPLANT
MAT PREVALON FULL STRYKER (MISCELLANEOUS) ×1 IMPLANT
NS IRRIG 1000ML POUR BTL (IV SOLUTION) ×1 IMPLANT
PACK C SECTION AR (MISCELLANEOUS) ×1 IMPLANT
PAD OB MATERNITY 4.3X12.25 (PERSONAL CARE ITEMS) ×1 IMPLANT
PAD PREP OB/GYN DISP 24X41 (PERSONAL CARE ITEMS) ×1 IMPLANT
SCRUB CHG 4% DYNA-HEX 4OZ (MISCELLANEOUS) ×1 IMPLANT
SUT MNCRL AB 4-0 PS2 18 (SUTURE) ×1 IMPLANT
SUT PLAIN 2 0 XLH (SUTURE) IMPLANT
SUT VIC AB 0 CT1 36 (SUTURE) ×4 IMPLANT
SUT VIC AB 3-0 SH 27 (SUTURE) ×1
SUT VIC AB 3-0 SH 27X BRD (SUTURE) ×1 IMPLANT
SYSTEM CONTND EXTRCTN KII BLLN (MISCELLANEOUS) IMPLANT
TAPE TRANSPORE STRL 2 31045 (GAUZE/BANDAGES/DRESSINGS) IMPLANT
TRAP FLUID SMOKE EVACUATOR (MISCELLANEOUS) ×1 IMPLANT
WATER STERILE IRR 500ML POUR (IV SOLUTION) ×1 IMPLANT

## 2022-11-29 NOTE — Lactation Note (Signed)
This note was copied from a baby's chart. Lactation Consultation Note  Patient Name: Boy Shaeleigh Zeeman [id number] Date: 11/29/2022 Age:25 hours Reason for consult: L&D Initial assessment  Lactation to L&D room for initial visit. Mother is holding the baby in football on the left and feeding. Encouraged feeding on demand and with cues. If baby is not cueing encouraged hand expression and skin to skin. Baby was latched well and feeding with swallows noted and a rhythmic sucking pattern. Mother states she had difficulty feeding her first child and mostly pumped for the 1st month. She has a hands free pumps at home for personal use. Encouraged 8 or more attempts in the first 24 hours and 8 or more good feeds after 24 HOL. Will revisit when couplet is transferred to MB unit.   Maternal Data Has patient been taught Hand Expression?: No (baby was latched when [facility] entered the room.) Does the patient have breastfeeding experience prior to this delivery?: Yes How long did the patient breastfeed?: 1 month, mostly pumped  Feeding Mother's Current Feeding Choice: Breast Milk  LATCH Score Latch: Repeated attempts needed to sustain latch, nipple held in mouth throughout feeding, stimulation needed to elicit sucking reflex. (L&D RN assisted with latch)  Audible Swallowing: A few with stimulation  Type of Nipple: Everted at rest and after stimulation  Comfort (Breast/Nipple): Soft / non-tender  Hold (Positioning): Assistance needed to correctly position infant at breast and maintain latch.  LATCH Score: 7   Lactation Tools Discussed/Used    Interventions Interventions: Breast feeding basics reviewed;Education  Discharge Pump: Hands Free;Personal (has a momcozy)  Consult Status Consult Status: Follow-up from L&D    Khaza Blansett D Zi Sek 11/29/2022, 2:01 PM

## 2022-11-29 NOTE — Transfer of Care (Signed)
Immediate Anesthesia Transfer of Care Note  Patient: Brenda Mccoy  Procedure(s) Performed: REPEAT CESAREAN SECTION  Patient Location: LDR # 6  Anesthesia Type:General  Level of Consciousness: awake, alert , and oriented  Airway & Oxygen Therapy: Patient Spontanous Breathing  Post-op Assessment: Report given to RN and Post -op Vital signs reviewed and stable  Post vital signs: Reviewed and stable  Last Vitals:  Vitals Value Taken Time  BP 104/91 11/29/22 1100  Temp 35.9 1058  Pulse 72 11/29/22 1101  Resp 17 11/29/22 1101  SpO2 100 % 11/29/22 1101  Vitals shown include unfiled device data.  Last Pain:  Vitals:   11/29/22 0701  TempSrc:   PainSc: 0-No pain         Complications: No notable events documented.

## 2022-11-29 NOTE — H&P (Signed)
Obstetric Preoperative History and Physical  Brenda Mccoy is a 27 y.o. G2P1001 with IUP at [redacted]w[redacted]d presenting for presenting for scheduled repeat cesarean section.  [facility] however noted if no delivery by her EDD would prefer C-section over an induction.  History of previous C-section x 1, morbid obesity in this pregnancy. No acute concerns. Has a history of postpartum hemorrhage with previous delivery.  Prenatal Course Source of Care: [facility] at [facility] with onset of care at 13 weeks Pregnancy complications or risks: Patient Active Problem List   Diagnosis Date Noted   Headache in pregnancy 10/08/2022   History of cesarean delivery affecting pregnancy 06/27/2022   Supervision of other normal pregnancy, antepartum 05/26/2022   History of postpartum hemorrhage, currently pregnant 09/06/2020   History of premature rupture of membranes 09/04/2020   Prediabetes 02/16/2020   Obesity affecting pregnancy in third trimester 02/16/2020   She plans to breastfeed She desires  undecided method  for postpartum contraception.   Prenatal labs and studies: ABO, Rh: --/--/B POS (08/05 0916) Antibody: NEG (08/05 0916) Rubella: 13.60 (02/05 1555) RPR: NON REACTIVE (08/05 0916)  HBsAg: Negative (02/05 1555)  HIV: Non Reactive (05/15 0949)  WUJ:WJXBJYNW/-- (07/11 1534) 1 hr Glucola  normal Genetic screening normal Anatomy US normal   Past Medical History:  Diagnosis Date   [redacted] weeks gestation of pregnancy 11/15/2022   [redacted] weeks gestation of pregnancy 11/15/2022   Anemia    Anxiety    COVID-19 09/03/2020   Depression    Headache in pregnancy    Labor and delivery, indication for care 11/18/2022   Obesity in pregnancy    PONV (postoperative nausea and vomiting)    Postpartum hemorrhage    Pre-diabetes    Premature rupture of membranes     Past Surgical History:  Procedure Laterality Date   CESAREAN SECTION  09/05/2020   Procedure: CESAREAN SECTION;  Surgeon:  Hildred Laser, MD;  Location: [facility] ORS;  Service: Obstetrics;;   ROOT CANAL     WISDOM TOOTH EXTRACTION     1    OB History  Gravida Para Term Preterm AB Living  2 1 1     1   SAB IAB Ectopic Multiple Live Births        0 1    # Outcome Date GA Lbr Len/2nd Weight Sex Type Anes PTL Lv  2 Current           1 Term 09/05/20 [redacted]w[redacted]d  3010 g M CS-LTranv Spinal, EPI  LIV    Social History   Socioeconomic History   Marital status: Single    Spouse name: Not on file   Number of children: 1   Years of education: 16   Highest education level: Not on file  Occupational History   Occupation: tech at [employer]  Tobacco Use   Smoking status: Never   Smokeless tobacco: Never  Vaping Use   Vaping status: Never Used  Substance and Sexual Activity   Alcohol use: Not Currently   Drug use: Never   Sexual activity: Yes    Partners: Male    Birth control/protection: None  Other Topics Concern   Not on file  Social History Narrative   Not on file   Social Determinants of Health   Financial Resource Strain: Low Risk  (05/26/2022)   Overall Financial Resource Strain (CARDIA)    Difficulty of Paying Living Expenses: Not very hard  Food Insecurity: No Food Insecurity (11/29/2022)   Hunger Vital Sign  Worried About Programme researcher, broadcasting/film/video in the Last Year: Never true    Ran Out of Food in the Last Year: Never true  Transportation Needs: No Transportation Needs (11/29/2022)   PRAPARE - Administrator, Civil Service (Medical): No    Lack of Transportation (Non-Medical): No  Physical Activity: Inactive (05/26/2022)   Exercise Vital Sign    Days of Exercise per Week: 0 days    Minutes of Exercise per Session: 0 min  Stress: No Stress Concern Present (05/26/2022)   [employer] of Occupational Health - Occupational Stress Questionnaire    Feeling of Stress : Not at all  Social Connections: Moderately Integrated (05/26/2022)   Social Connection and Isolation Panel [NHANES]    Frequency of  Communication with Friends and Family: More than three times a week    Frequency of Social Gatherings with Friends and Family: Once a week    Attends Religious Services: More than 4 times per year    Active Member of [employer] or Organizations: No    Attends Engineer, structural: Never    Marital Status: Living with partner    Family History  Problem Relation Age of Onset   Hypertension Mother    Healthy Father    Other Sister        HPV   Healthy Sister    Healthy Sister    Healthy Sister    Healthy Brother    Healthy Brother    Healthy Brother    Healthy Maternal Grandmother    Hypertension Maternal Grandfather    Cancer Paternal Grandmother    Healthy Paternal Grandmother     Medications Prior to Admission  Medication Sig Dispense Refill Last Dose   acetaminophen (TYLENOL) 500 MG tablet Take 2 tablets (1,000 mg total) by mouth every 6 (six) hours as needed for fever or headache. 30 tablet 0    Ferric Maltol (ACCRUFER) 30 MG CAPS Take 1 capsule (30 mg total) by mouth daily. 90 capsule 3    prenatal vitamin w/FE, FA (NATACHEW) 29-1 MG CHEW chewable tablet Chew 2 tablets by mouth daily at 12 noon.       Allergies  Allergen Reactions   Miconazole Itching    Review of Systems: Negative except for what is mentioned in HPI.  Physical Exam: BP 116/73   Pulse 77   Temp 98.7 F (37.1 C) (Oral)   Resp 18   Ht 5\' 4"  (1.626 m)   Wt 114.5 kg   LMP  (LMP Unknown)   BMI 43.32 kg/m  FHR by Doppler: 140 bpm GENERAL: Well-developed, well-nourished female in no acute distress.  LUNGS: Clear to auscultation bilaterally.  HEART: Regular rate and rhythm. ABDOMEN: Soft, nontender, nondistended, gravid, well-healed Pfannenstiel incision. PELVIC: Deferred EXTREMITIES: Nontender, no edema, 2+ distal pulses.   Pertinent Labs/Studies:   Results for orders placed or performed during the hospital encounter of 11/27/22 (from the past 72 hour(s))  CBC     Status: Abnormal    Collection Time: 11/27/22  9:16 AM  Result Value Ref Range   WBC 5.7 4.0 - 10.5 K/uL   RBC 4.08 3.87 - 5.11 MIL/uL   Hemoglobin 11.7 (L) 12.0 - 15.0 g/dL   HCT 16.1 (L) 09.6 - 04.5 %   MCV 84.6 80.0 - 100.0 fL   MCH 28.7 26.0 - 34.0 pg   MCHC 33.9 30.0 - 36.0 g/dL   RDW 40.9 81.1 - 91.4 %   Platelets 169 150 - 400  K/uL   nRBC 0.0 0.0 - 0.2 %    Comment: Performed at [facility], [address] [place], [place] [address]  RPR     Status: None   Collection Time: 11/27/22  9:16 AM  Result Value Ref Range   RPR Ser Ql NON REACTIVE NON REACTIVE    Comment: Performed at [facility], [address], [place], [place] [address]  Type and screen     Status: None   Collection Time: 11/27/22  9:16 AM  Result Value Ref Range   ABO/RH(D) B POS    Antibody Screen NEG    Sample Expiration 11/30/2022,2359    Extend sample reason      PREGNANT WITHIN 3 MONTHS, UNABLE TO EXTEND Performed at [facility], [address], [place], [place] [address]     Assessment and Plan :Brenda Mccoy is a 27 y.o. G2P1001 at [redacted]w[redacted]d being admitted  for scheduled repeat cesarean section delivery . The patient is understanding of the planned procedure and is aware of and accepting of all surgical risks, including but not limited to: bleeding which may require transfusion or reoperation; infection which may require antibiotics; injury to bowel, bladder, ureters or other surrounding organs which may require repair; injury to the fetus; need for additional procedures including hysterectomy in the event of life-threatening complications; placental abnormalities wth subsequent pregnancies; incisional problems; blood clot disorders which may require blood thinners;, and other postoperative/anesthesia complications. The patient is in agreement with the proposed plan, and gives informed written consent for the procedure. All questions have been answered.   Hildred Laser, MD [facility] at  [facility]

## 2022-11-29 NOTE — Anesthesia Preprocedure Evaluation (Signed)
Anesthesia Evaluation  Patient identified by MRN, date of birth, ID band Patient awake    Reviewed: Allergy & Precautions, NPO status , Patient's Chart, lab work & pertinent test results  Airway Mallampati: IV  TM Distance: >3 FB Neck ROM: full    Dental  (+) Chipped, Dental Advidsory Given   Pulmonary neg pulmonary ROS   Pulmonary exam normal        Cardiovascular Exercise Tolerance: Good negative cardio ROS Normal cardiovascular exam     Neuro/Psych  PSYCHIATRIC DISORDERS         GI/Hepatic negative GI ROS,,,  Endo/Other    Morbid obesity  Renal/GU   negative genitourinary   Musculoskeletal   Abdominal   Peds  Hematology negative hematology ROS (+)   Anesthesia Other Findings Past Medical History: 11/15/2022: [redacted] weeks gestation of pregnancy 11/15/2022: [redacted] weeks gestation of pregnancy No date: Anemia No date: Anxiety 09/03/2020: COVID-19 No date: Depression No date: Headache in pregnancy 11/18/2022: Labor and delivery, indication for care No date: Obesity in pregnancy No date: PONV (postoperative nausea and vomiting) No date: Postpartum hemorrhage No date: Pre-diabetes No date: Premature rupture of membranes  Past Surgical History: 09/05/2020: CESAREAN SECTION     Comment:  Procedure: CESAREAN SECTION;  Surgeon: Hildred Laser,               MD;  Location: [facility] ORS;  Service: Obstetrics;; No date: ROOT CANAL No date: WISDOM TOOTH EXTRACTION     Comment:  1  BMI    Body Mass Index: 43.32 kg/m      Reproductive/Obstetrics (+) Pregnancy                             Anesthesia Physical Anesthesia Plan  ASA: 3  Anesthesia Plan: Spinal   Post-op Pain Management:    Induction:   PONV Risk Score and Plan: 2 and Ondansetron and Dexamethasone  Airway Management Planned: Natural Airway and Nasal Cannula  Additional Equipment:   Intra-op Plan:   Post-operative Plan:    Informed Consent: I have reviewed the patients History and Physical, chart, labs and discussed the procedure including the risks, benefits and alternatives for the proposed anesthesia with the patient or authorized representative who has indicated his/her understanding and acceptance.     Dental Advisory Given  Plan Discussed with: Anesthesiologist, CRNA and Surgeon  Anesthesia Plan Comments: (Patient reports no bleeding problems and no anticoagulant use.  Plan for spinal with backup GA  Patient consented for risks of anesthesia including but not limited to:  - adverse reactions to medications - damage to eyes, teeth, lips or other oral mucosa - nerve damage due to positioning  - risk of bleeding, infection and or nerve damage from spinal that could lead to paralysis - risk of headache or failed spinal - damage to teeth, lips or other oral mucosa - sore throat or hoarseness - damage to heart, brain, nerves, lungs, other parts of body or loss of life  Patient voiced understanding.)       Anesthesia Quick Evaluation

## 2022-11-29 NOTE — Anesthesia Procedure Notes (Addendum)
Spinal  Patient location during procedure: OR Start time: 11/29/2022 9:38 AM End time: 11/29/2022 9:44 AM Reason for block: surgical anesthesia Staffing Performed: anesthesiologist  Anesthesiologist: Reed Breech, MD Resident/CRNA: Rolena Infante, RN Performed by: Reed Breech, MD Authorized by: Reed Breech, MD   Preanesthetic Checklist Completed: patient identified, IV checked, site marked, risks and benefits discussed, surgical consent, monitors and equipment checked, pre-op evaluation and timeout performed Spinal Block Patient position: sitting Prep: ChloraPrep Patient monitoring: heart rate, cardiac monitor, continuous pulse ox and blood pressure Approach: right paramedian Location: L2-3 Injection technique: single-shot Needle Needle type: Whitacre  Needle gauge: 22 G Needle length: 9 cm Assessment Sensory level: T4 Events: CSF return and second provider Additional Notes Initial attempts midline by Johnsie Cancel with os encountered at all passes.  Kaleeyah Cuffie attempted right paramedian, initially unsuccessful at L3-4 unable to pass lamina, then successful at L2-3 going farther laterally.

## 2022-11-29 NOTE — Op Note (Signed)
Cesarean Section Procedure Note  Indications:  27 y.o. G2P2002 at [redacted]w[redacted]d with a history of Cesarean section x 1, initially desiring TOLAC now desiring repeat C-section  Pre-operative Diagnosis: 40 week 0 day pregnancy, history of C-section x 1, initially desiring TOLAC now desiring C-section, morbid obesity.  Post-operative Diagnosis: Same  Surgeon: Hildred Laser, MD  Assistants:  Doreene Burke, CNM  Procedure: Repeat low transverse Cesarean Section  Anesthesia: Spinal anesthesia  Findings: Female infant, cephalic presentation, 3650 grams, with Apgar scores of 8 at one minute and 9 at five minutes. Intact placenta with 3 vessel cord.  Clear amniotic fluid at amniotomy The uterine outline, tubes and ovaries appeared normal.   Procedure Details: The patient was seen in the Holding Room. The risks, benefits, complications, treatment options, and expected outcomes were discussed with the patient.  The patient concurred with the proposed plan, giving informed consent.  The site of surgery properly noted/marked. The patient was taken to the Operating Room, identified as Brenda Mccoy and the procedure verified as C-Section Delivery. A Time Out was held and the above information confirmed.  After induction of anesthesia, the patient was draped and prepped in the usual sterile manner. Anesthesia was tested and noted to be adequate. A Pfannenstiel incision was made and carried down through the subcutaneous tissue to the fascia. Fascial incision was made and extended transversely. The fascia was separated from the underlying rectus tissue superiorly and inferiorly. The peritoneum was identified and entered. Peritoneal incision was extended longitudinally. The surgical assist was able to provide retraction to allow for clear visualization of surgical site. An Alexis retractor was placed in the abdomen for additional retraction. The utero-vesical peritoneal reflection was incised transversely and the  bladder flap was bluntly freed from the lower uterine segment. A low transverse uterine incision was made. Delivered from cephalic presentation was a 3650 gram Female with Apgar scores of 8 at one minute and 9 at five minutes.  The assistant was able to apply adequate fundal pressure to allow for successful delivery of the fetus. The the umbilical cord was clamped and cut, no cord blood was obtained. Delayed cord clamping was observed. The placenta was removed intact and appeared normal. The uterus was exteriorized and cleared of all clots and debris. The uterine outline, tubes and ovaries appeared normal.  The uterine incision was closed with running locked sutures of 0-Vicryl.  A second suture of 0-Vicryl was used in an imbricating layer. Several figure-of-eight sutures were placed along the midline of the incision due to observed oozing.  Hemostasis was achieved. The uterus was then returned to the abdomen. The pericolic gutters were cleared of all clots and debris. The fascia was then reapproximated with a running suture of 0-Vicryl. The subcutaneous fat layer was reapproximated with 2-0 Vicryl. The skin was reapproximated with 4-0 Monocryl. The incision was covered with steri-strips and a pressure dressing. A lidocaine patch was placed over the incision.   Instrument, sponge, and needle counts were correct prior the abdominal closure and at the conclusion of the case.    An experienced assistant was required given the standard of surgical care given the complexity of the case.  This assistant was needed for exposure, dissection, suctioning, retraction, instrument exchange, and for overall help during the procedure.  Estimated Blood Loss:  570 ml      Drains: foley catheter to gravity drainage, 40 ml of clear urine at end of the procedure         Total IV Fluids:  1200 ml  Specimens: None         Implants: None         Complications:  None; patient tolerated the procedure well.          Disposition: PACU - hemodynamically stable.         Condition: stable   Hildred Laser, MD [facility] at [facility]

## 2022-11-30 ENCOUNTER — Encounter: Payer: Self-pay | Admitting: Obstetrics and Gynecology

## 2022-11-30 NOTE — Discharge Summary (Signed)
Postpartum Discharge Summary  Date of Service updated 12/01/2022     Patient Name: Brenda Mccoy DOB: 1995-10-24 MRN: [id number]  Date of admission: 11/29/2022 Delivery date:11/29/2022 Delivering provider: Hildred Laser Date of discharge: 12/01/2022  Admitting diagnosis: History of cesarean delivery affecting pregnancy [O34.219] Intrauterine pregnancy: [redacted]w[redacted]d     Secondary diagnosis:  Principal Problem:   History of cesarean delivery affecting pregnancy Active Problems:   Obesity affecting pregnancy in third trimester   History of postpartum hemorrhage, currently pregnant  Additional problems: None    Discharge diagnosis: Term Pregnancy Delivered and Anemia                                              Post partum procedures: None Augmentation: N/A Complications: None  Hospital course: Sceduled C/S   27 y.o. yo G2P2002 at [redacted]w[redacted]d was admitted to the hospital 11/29/2022 for scheduled cesarean section with the following indication:Elective Repeat.Delivery details are as follows:  Membrane Rupture Time/Date: 10:09 AM,11/29/2022  Delivery Method:C-Section, Low Transverse Operative Delivery:N/A Details of operation can be found in separate operative note.  Patient had an uncomplicated postpartum course. She is ambulating, tolerating a regular diet, passing flatus, and urinating well. Patient is discharged home in stable condition on  12/01/22        Newborn Data: Birth date:11/29/2022 Birth time:10:10 AM Gender:Female Living status:Living Apgars:8 ,9  Weight:3650 g    Magnesium Sulfate received: No BMZ received: No Rhophylac:No MMR:No T-DaP:Given prenatally Flu: No Transfusion:No  Physical exam  Vitals:   11/30/22 0736 11/30/22 1619 11/30/22 2328 12/01/22 0824  BP: 115/72 118/67 132/66 116/60  Pulse: 78 69 67 65  Resp: 18 18 20 18   Temp: 99.2 F (37.3 C) 98.4 F (36.9 C) 97.6 F (36.4 C) 98.6 F (37 C)  TempSrc: Oral Oral  Oral  SpO2: 98% 100% 99% 99%  Weight:       Height:       General: alert, cooperative, and no distress Lochia: appropriate Uterine Fundus: firm Incision: Healing well with no significant drainage, No significant erythema DVT Evaluation: No evidence of DVT seen on physical exam. Negative Homan's sign. No cords or calf tenderness.  No significant calf/ankle edema.    Labs: Lab Results  Component Value Date   WBC 15.1 (H) 11/30/2022   HGB 9.5 (L) 11/30/2022   HCT 27.9 (L) 11/30/2022   MCV 84.5 11/30/2022   PLT 161 11/30/2022      Latest Ref Rng & Units 10/08/2022   10:37 PM  CMP  Glucose 70 - 99 mg/dL 629   BUN 6 - 20 mg/dL 9   Creatinine 5.28 - 4.13 mg/dL 2.44   Sodium 010 - 272 mmol/L 136   Potassium 3.5 - 5.1 mmol/L 4.0   Chloride 98 - 111 mmol/L 106   CO2 22 - 32 mmol/L 21   Calcium 8.9 - 10.3 mg/dL 8.8   Total Protein 6.5 - 8.1 g/dL 6.5   Total Bilirubin 0.3 - 1.2 mg/dL 0.5   Alkaline Phos 38 - 126 U/L 79   AST 15 - 41 U/L 14   ALT 0 - 44 U/L 12    Edinburgh Score:    11/29/2022    2:45 PM  Edinburgh Postnatal Depression Scale Screening Tool  I have been able to laugh and see the funny side of things. 0  I have looked  forward with enjoyment to things. 0  I have blamed myself unnecessarily when things went wrong. 1  I have been anxious or worried for no good reason. 2  I have felt scared or panicky for no good reason. 2  Things have been getting on top of me. 0  I have been so unhappy that I have had difficulty sleeping. 0  I have felt sad or miserable. 1  I have been so unhappy that I have been crying. 0  The thought of harming myself has occurred to me. 1  Edinburgh Postnatal Depression Scale Total 7      After visit meds:  Allergies as of 12/01/2022       Reactions   Miconazole Itching        Medication List     TAKE these medications    ACCRUFeR 30 MG Caps Generic drug: Ferric Maltol Take 1 capsule (30 mg total) by mouth daily.   acetaminophen 500 MG tablet Commonly known as:  TYLENOL Take 2 tablets (1,000 mg total) by mouth every 6 (six) hours as needed for fever or headache.   dibucaine 1 % Oint Commonly known as: NUPERCAINAL Place 1 Application rectally as needed for hemorrhoids.   ibuprofen 600 MG tablet Commonly known as: ADVIL Take 1 tablet (600 mg total) by mouth every 6 (six) hours.   lidocaine 4 % Commonly known as: HM Lidocaine Patch Place 1 patch onto the skin daily.   oxyCODONE 5 MG immediate release tablet Commonly known as: Oxy IR/ROXICODONE Take 1 tablet (5 mg total) by mouth every 6 (six) hours as needed for up to 5 days for severe pain.   polyethylene glycol 17 g packet Commonly known as: MiraLax Take 17 g by mouth daily.   prenatal vitamin w/FE, FA 29-1 MG Chew chewable tablet Chew 2 tablets by mouth daily at 12 noon.   senna-docusate 8.6-50 MG tablet Commonly known as: Senokot-S Take 2 tablets by mouth daily. Start taking on: December 02, 2022   simethicone 80 MG chewable tablet Commonly known as: MYLICON Chew 1 tablet (80 mg total) by mouth 3 (three) times daily after meals.   witch hazel-glycerin pad Commonly known as: TUCKS Apply 1 Application topically as needed for hemorrhoids.         Discharge home in stable condition Infant Feeding: Breast Infant Disposition:home with mother Discharge instruction: per After Visit Summary and Postpartum booklet. Activity: Advance as tolerated. Pelvic rest for 6 weeks.  Diet: routine diet Anticipated Birth Control: OCPs Postpartum Appointment:6 weeks Additional Postpartum F/U: Postpartum Depression checkup and Incision check 1 week Future Appointments: Future Appointments  Date Time Provider Department Center  12/06/2022 10:55 AM Hildred Laser, MD [facility] None   Follow up Visit:  Follow-up Information     Hildred Laser, MD Follow up in 1 week(s).   Specialties: Obstetrics and Gynecology, Radiology Why: Incision check Contact information: [address] [place] [address] [phone]         [facility] at [facility] Follow up in 6 week(s).   Specialty: Obstetrics and Gynecology Contact information: [address] [place] [address] [phone]                    12/01/2022 Dominica Severin, CNM

## 2022-11-30 NOTE — Progress Notes (Signed)
Postpartum Day # 1: Cesarean Delivery (repeat)  Subjective: Patient reports tolerating PO and no problems voiding.  Has ambulated some.  Does note some incisional pain that comes and goes, but is controlled with pain medication. Feels tired as infant was up all night.   Objective: Vital signs in last 24 hours: Temp:  [97.7 F (36.5 C)-98.6 F (37 C)] 98.5 F (36.9 C) (08/08 0340) Pulse Rate:  [62-89] 70 (08/08 0340) Resp:  [15-25] 18 (08/08 0340) BP: (97-130)/(51-91) 115/55 (08/08 0340) SpO2:  [89 %-100 %] 98 % (08/08 0340)  Physical Exam:  General: alert and no distress Lungs: clear to auscultation bilaterally Breasts: normal appearance, no masses or tenderness Heart: regular rate and rhythm, S1, S2 normal, no murmur, click, rub or gallop Abdomen: soft, non-tender; bowel sounds normal; no masses,  no organomegaly Pelvis: Lochia appropriate, Uterine Fundus firm, Incision: bandage clean/dry/intact Extremities: DVT Evaluation: No evidence of DVT seen on physical exam. Negative Homan's sign. No cords or calf tenderness. No significant calf/ankle edema.   Recent Labs    11/27/22 0916 11/30/22 0541  HGB 11.7* 9.5*  HCT 34.5* 27.9*    Assessment/Plan: Status post Cesarean section. Doing well postoperatively.  Breastfeeding, Lactation consult  Circumcision prior to discharge Contraception considering OCPs Regular diet as tolerated Continue PO pain management Anemia postpartum due to surgical blood loss, mild, asymptomatic. Will treat with PO iron.  Continue current care.  Hildred Laser, MD [facility]

## 2022-11-30 NOTE — Lactation Note (Signed)
This note was copied from a baby's chart. Lactation Consultation Note  Patient Name: Brenda Mccoy Date: 11/30/2022 Age:25 hours Reason for consult: Follow-up assessment;Mother's request;Term;Difficult latch;Breastfeeding assistance;RN request   Maternal Data This is mom's 2nd baby, C/S. Mom with history of obesity, pre-diabetes, anxiety, depression, and anemia. On follow-up visit today mom reports baby will not latch and is mostly bottle feeding however mom reports she wants to also breastfeed.  Has patient been taught Hand Expression?: Yes Does the patient have breastfeeding experience prior to this delivery?: Yes How long did the patient breastfeed?: 1 month mostly pumped  Feeding Mother's Current Feeding Choice: Breast Milk and Formula Assisted mom with maximizing position and latch technique. Baby did latched fairly readily . A few audible swallows were noted at each breast. Encouraged mom to massage her breast to keep baby interested at the breast . Baby did sustain the latch and suckled well. Post breastfeeding after baby burped he continued to cue. Mom supplemented baby with 20 ml's of formula. Discussed with mom as baby received mostly bottle feeds since birth baby may not be content after breastfeeding. Discussed with mom to offer baby to breastfeed before offering baby supplemental milk at each feeding session. If baby will not latch and breastfeed at a feeding recommended mom pump. Mom agreeable to pump. Pump set-up in room. LATCH Score Latch: Repeated attempts needed to sustain latch, nipple held in mouth throughout feeding, stimulation needed to elicit sucking reflex.  Audible Swallowing: A few with stimulation  Type of Nipple: Everted at rest and after stimulation  Comfort (Breast/Nipple): Soft / non-tender  Hold (Positioning): Assistance needed to correctly position infant at breast and maintain latch.  LATCH Score: 7   Lactation Tools Discussed/Used   DEBP, set-up, cleaning of parts, breastmilk storage.  Interventions Interventions: Breast feeding basics reviewed;Assisted with latch;Breast massage;Hand express;Breast compression;Adjust position;Support pillows;Education Reviewed what to expect in the early days when breastfeeding 8-12 feeds in 24 hours, cluster feeding, and how to know the baby is getting enough.  Discharge Pump: Personal  Consult Status Consult Status: Follow-up Date: 12/01/22 Follow-up type: In-patient  Update provided to care nurse.  Fuller Song 11/30/2022, 6:42 PM

## 2022-11-30 NOTE — Anesthesia Post-op Follow-up Note (Signed)
  Anesthesia Pain Follow-up Note  Patient: Brenda Mccoy  Day #: 1  Date of Follow-up: 11/30/2022 Time: 7:31 AM  Last Vitals:  Vitals:   11/30/22 0300 11/30/22 0340  BP:  (!) 115/55  Pulse: 89 70  Resp:  18  Temp:  36.9 C  SpO2: 94% 98%    Level of Consciousness: alert  Pain: none   Side Effects:None  Catheter Site Exam:clean, dry, no drainage     Plan: D/C from anesthesia care at surgeon's request  Starling Manns

## 2022-11-30 NOTE — Anesthesia Postprocedure Evaluation (Signed)
Anesthesia Post Note  Patient: Brenda Mccoy  Procedure(s) Performed: REPEAT CESAREAN SECTION  Patient location during evaluation: Mother Baby Anesthesia Type: Spinal Level of consciousness: oriented and awake and alert Pain management: pain level controlled Vital Signs Assessment: post-procedure vital signs reviewed and stable Respiratory status: spontaneous breathing and respiratory function stable Cardiovascular status: blood pressure returned to baseline and stable Postop Assessment: no headache, no backache, no apparent nausea or vomiting and able to ambulate Anesthetic complications: no   No notable events documented.   Last Vitals:  Vitals:   11/30/22 0300 11/30/22 0340  BP:  (!) 115/55  Pulse: 89 70  Resp:  18  Temp:  36.9 C  SpO2: 94% 98%    Last Pain:  Vitals:   11/30/22 0340  TempSrc: Oral  PainSc:                  Brenda Mccoy

## 2022-12-01 ENCOUNTER — Other Ambulatory Visit: Payer: Self-pay

## 2022-12-01 MED ORDER — IBUPROFEN 600 MG PO TABS: 600.0000 mg | ORAL_TABLET | Freq: Four times a day (QID) | ORAL | 1 refills | Status: DC

## 2022-12-01 MED ORDER — POLYETHYLENE GLYCOL 3350 17 GM/SCOOP PO POWD
17.0000 g | Freq: Every day | ORAL | 11 refills | Status: DC
  Filled 2022-12-01: qty 510, 30d supply, fill #0

## 2022-12-01 MED ORDER — POLYETHYLENE GLYCOL 3350 17 G PO PACK: 17.0000 g | PACK | Freq: Every day | ORAL | 11 refills | Status: DC

## 2022-12-01 MED ORDER — IBUPROFEN 600 MG PO TABS
600.0000 mg | ORAL_TABLET | Freq: Four times a day (QID) | ORAL | 1 refills | Status: DC | PRN
  Filled 2022-12-01: qty 60, 15d supply, fill #0

## 2022-12-01 MED ORDER — ACETAMINOPHEN 500 MG PO TABS: 1000.0000 mg | ORAL_TABLET | Freq: Four times a day (QID) | ORAL | 1 refills | Status: DC | PRN

## 2022-12-01 MED ORDER — WITCH HAZEL-GLYCERIN EX PADS: 1.0000 | MEDICATED_PAD | CUTANEOUS | Status: DC | PRN

## 2022-12-01 MED ORDER — OXYCODONE HCL 5 MG PO TABS
5.0000 mg | ORAL_TABLET | ORAL | 0 refills | Status: DC | PRN
  Filled 2022-12-01: qty 30, 5d supply, fill #0

## 2022-12-01 MED ORDER — OXYCODONE HCL 5 MG PO TABS: 5.0000 mg | ORAL_TABLET | Freq: Four times a day (QID) | ORAL | 0 refills | Status: DC | PRN

## 2022-12-01 MED ORDER — LIDOCAINE 4 % EX PTCH: 1.0000 | MEDICATED_PATCH | CUTANEOUS | 0 refills | Status: DC

## 2022-12-01 MED ORDER — LIDOCAINE 4 % EX PTCH
1.0000 | MEDICATED_PATCH | CUTANEOUS | 0 refills | Status: DC
  Filled 2022-12-01: qty 7, 7d supply, fill #0

## 2022-12-01 MED ORDER — SIMETHICONE 80 MG PO CHEW: 80.0000 mg | CHEWABLE_TABLET | Freq: Three times a day (TID) | ORAL | 0 refills | Status: DC

## 2022-12-01 MED ORDER — SENNOSIDES-DOCUSATE SODIUM 8.6-50 MG PO TABS: 2.0000 | ORAL_TABLET | Freq: Every day | ORAL | 1 refills | Status: DC

## 2022-12-01 MED ORDER — DIBUCAINE (PERIANAL) 1 % EX OINT: 1.0000 | TOPICAL_OINTMENT | CUTANEOUS | Status: DC | PRN

## 2022-12-01 MED ORDER — ACETAMINOPHEN 500 MG PO TABS
1000.0000 mg | ORAL_TABLET | Freq: Four times a day (QID) | ORAL | 1 refills | Status: DC | PRN
  Filled 2022-12-01: qty 60, 8d supply, fill #0

## 2022-12-01 MED ORDER — DOCUSATE SODIUM 100 MG PO CAPS
100.0000 mg | ORAL_CAPSULE | Freq: Two times a day (BID) | ORAL | 2 refills | Status: DC | PRN
  Filled 2022-12-01: qty 30, 15d supply, fill #0

## 2022-12-01 NOTE — Progress Notes (Signed)
Patient discharged home with family.  Discharge instructions, when to follow up, and prescriptions reviewed with patient.  Patient verbalized understanding. Patient will be escorted out by auxiliary.   

## 2022-12-06 ENCOUNTER — Encounter: Payer: Self-pay | Admitting: Obstetrics and Gynecology

## 2022-12-06 ENCOUNTER — Ambulatory Visit (INDEPENDENT_AMBULATORY_CARE_PROVIDER_SITE_OTHER): Payer: BC Managed Care – PPO | Admitting: Obstetrics and Gynecology

## 2022-12-06 DIAGNOSIS — Z4889 Encounter for other specified surgical aftercare: Secondary | ICD-10-CM

## 2022-12-06 DIAGNOSIS — Z1332 Encounter for screening for maternal depression: Secondary | ICD-10-CM | POA: Diagnosis not present

## 2022-12-06 NOTE — Progress Notes (Signed)
    OBSTETRICS/GYNECOLOGY POST-OPERATIVE CLINIC VISIT  Subjective:     Brenda Mccoy is a 27 y.o. y.o. G13P2002 female who presents to the clinic 1 weeks status post REPEAT CESAREAN SECTION  for History of previous Cesarean delivery . Eating a regular diet without difficulty. Bowel movements are normal. Pain is controlled with current analgesics. Medications being used: ibuprofen (OTC) and narcotic analgesics including oxycodone (Oxycontin, Oxyir). She has some swelling in her lower legs and feet, however is slowly improving.   Is breastfeeding infant, doing well. Notes her bleeding is mild.   The following portions of the patient's history were reviewed and updated as appropriate: allergies, current medications, past family history, past medical history, past social history, past surgical history, and problem list.  Review of Systems Pertinent items are noted in HPI.   Objective:   BP 121/77   Pulse (!) 53   Resp 16   Ht 5\' 4"  (1.626 m)   Wt 214 lb 4.8 oz (97.2 kg)   Breastfeeding Yes   BMI 36.78 kg/m  Body mass index is 36.78 kg/m.  General:  alert and no distress  Abdomen: soft, bowel sounds active, non-tender  Incision:   healing well, no drainage, no erythema, no hernia, no seroma, no swelling, no dehiscence, incision well approximated        12/06/2022   11:48 AM 11/29/2022    2:45 PM 05/26/2022    3:37 PM 09/06/2020    2:45 PM  Edinburgh Postnatal Depression Scale Screening Tool  I have been able to laugh and see the funny side of things. 0 0 0 0  I have looked forward with enjoyment to things. 0 0 0 0  I have blamed myself unnecessarily when things went wrong. 0 1 0 1  I have been anxious or worried for no good reason. 1 2 0 0  I have felt scared or panicky for no good reason. 0 2 0 0  Things have been getting on top of me. 1 0 1 1  I have been so unhappy that I have had difficulty sleeping. 0 0 0 0  I have felt sad or miserable. 1 1 0 1  I have been so unhappy that I  have been crying. 1 0 0 1  The thought of harming myself has occurred to me. 0 1 0 1  Edinburgh Postnatal Depression Scale Total 4 7 1 5       Assessment:   Patient s/p Cesarean delivery   Doing well postoperatively. Lactating mother Screening for maternal depression  Plan:   1. Continue any current medications as instructed by provider.  2. Wound care discussed.  After bandage removed several steri-strips were noted to be very loose.  Incision cleaned and new Steri-Strips placed. 3.  Maternal depression screen negative.  4. Doing well breastfeeding.  5. Activity restrictions: no bending, stooping, or squatting, no lifting more than 10-15 pounds, and pelvic rest x 5 weeks 6. Anticipated return to work: 4-5 weeks. 7. Follow up: 5 weeks for final postpartum visit.     Hildred Laser, MD [facility]

## 2022-12-06 NOTE — Patient Instructions (Signed)

## 2022-12-07 ENCOUNTER — Telehealth: Payer: Self-pay

## 2022-12-13 ENCOUNTER — Ambulatory Visit (INDEPENDENT_AMBULATORY_CARE_PROVIDER_SITE_OTHER): Payer: BC Managed Care – PPO | Admitting: Obstetrics

## 2022-12-13 ENCOUNTER — Encounter: Payer: Self-pay | Admitting: Obstetrics

## 2022-12-13 NOTE — Progress Notes (Addendum)
Postpartum Visit  Chief Complaint:  Chief Complaint  Patient presents with   Postpartum Care    2 wk PP, decrease in milk supply    History of Present Illness: Patient is a 27 y.o. Mccoy presents for a virtual 2 week postpartum visit. She had a repeat Cesarean section on 8/7. Had considered TOLAC, but decided that once she reached her EDD, if she had not labored, she would have the repeat. As this is a virtual visit, she is identified by her name and DOB. The people on the call were Paula Compton CNM, and Elizebeth Koller. Date of delivery: 11/29/2022 Type of delivery: Vaginal delivery - Vacuum or forceps assisted  no repeat cesarean section.    Pregnancy or labor problems:  no Any problems since the delivery:  no  Newborn Details:  SINGLETON :  1. Baby's name: female. Birth weight: 8lbs Maternal Details:  Breast Feeding:  yes Post partum depression/anxiety noted:  no Edinburgh Post-Partum Depression Score:  2    Past Medical History:  Diagnosis Date   [redacted] weeks gestation of pregnancy 11/15/2022   [redacted] weeks gestation of pregnancy 11/15/2022   Anemia    Anxiety    COVID-19 09/03/2020   Depression    Headache in pregnancy    Labor and delivery, indication for care 11/18/2022   Obesity in pregnancy    PONV (postoperative nausea and vomiting)    Postpartum hemorrhage    Pre-diabetes    Premature rupture of membranes     Past Surgical History:  Procedure Laterality Date   CESAREAN SECTION  09/05/2020   Procedure: CESAREAN SECTION;  Surgeon: Hildred Laser, MD;  Location: [facility] ORS;  Service: Obstetrics;;   CESAREAN SECTION N/A 11/29/2022   Procedure: REPEAT CESAREAN SECTION;  Surgeon: Hildred Laser, MD;  Location: [facility] ORS;  Service: Obstetrics;  Laterality: N/A;   ROOT CANAL     WISDOM TOOTH EXTRACTION     1    Prior to Admission medications   Medication Sig Start Date End Date Taking? Authorizing Provider  acetaminophen (TYLENOL) 500 MG tablet Take 2 tablets (1,000 mg  total) by mouth every 6 (six) hours as needed. 12/01/22  Yes Dominica Severin, CNM  Ferric Maltol (ACCRUFER) 30 MG CAPS Take 1 capsule (30 mg total) by mouth daily. 09/27/22  Yes Hildred Laser, MD  ibuprofen (ADVIL) 600 MG tablet Take 1 tablet (600 mg total) by mouth every 6 (six) hours. 12/01/22  Yes Dominica Severin, CNM  prenatal vitamin w/FE, FA (NATACHEW) 29-1 MG CHEW chewable tablet Chew 2 tablets by mouth daily at 12 noon.   Yes [provider]    Allergies  Allergen Reactions   Miconazole Itching     Social History   Socioeconomic History   Marital status: Single    Spouse name: Not on file   Number of children: 1   Years of education: 16   Highest education level: Not on file  Occupational History   Occupation: tech at [employer]  Tobacco Use   Smoking status: Never   Smokeless tobacco: Never  Vaping Use   Vaping status: Never Used  Substance and Sexual Activity   Alcohol use: Not Currently   Drug use: Never   Sexual activity: Yes    Partners: Male    Birth control/protection: None  Other Topics Concern   Not on file  Social History Narrative   Not on file   Social Determinants of Health   Financial Resource Strain: Low Risk  (  05/26/2022)   Overall Financial Resource Strain (CARDIA)    Difficulty of Paying Living Expenses: Not very hard  Food Insecurity: No Food Insecurity (11/29/2022)   Hunger Vital Sign    Worried About Running Out of Food in the Last Year: Never true    Ran Out of Food in the Last Year: Never true  Transportation Needs: No Transportation Needs (11/29/2022)   PRAPARE - Administrator, Civil Service (Medical): No    Lack of Transportation (Non-Medical): No  Physical Activity: Inactive (05/26/2022)   Exercise Vital Sign    Days of Exercise per Week: 0 days    Minutes of Exercise per Session: 0 min  Stress: No Stress Concern Present (05/26/2022)   [employer] of Occupational Health - Occupational Stress Questionnaire    Feeling  of Stress : Not at all  Social Connections: Moderately Integrated (05/26/2022)   Social Connection and Isolation Panel [NHANES]    Frequency of Communication with Friends and Family: More than three times a week    Frequency of Social Gatherings with Friends and Family: Once a week    Attends Religious Services: More than 4 times per year    Active Member of [employer] or Organizations: No    Attends Banker Meetings: Never    Marital Status: Living with partner  Intimate Partner Violence: Not At Risk (11/29/2022)   Humiliation, Afraid, Rape, and Kick questionnaire    Fear of Current or Ex-Partner: No    Emotionally Abused: No    Physically Abused: No    Sexually Abused: No    Family History  Problem Relation Age of Onset   Hypertension Mother    Healthy Father    Other Sister        HPV   Healthy Sister    Healthy Sister    Healthy Sister    Healthy Brother    Healthy Brother    Healthy Brother    Healthy Maternal Grandmother    Hypertension Maternal Grandfather    Cancer Paternal Grandmother    Healthy Paternal Grandmother        Physical Exam Breastfeeding Yes      Female Chaperone present during breast and/or pelvic exam.  Assessment: 27 y.o. [phone] presenting for 2 week postpartum visit  Plan: Problem List Items Addressed This Visit   None Visit Diagnoses     Postpartum care following cesarean delivery    -  Primary   2 weeks postpartum follow-up            1) Contraception Education given regarding options for contraception, including IUD placement, oral contraceptives, and Nexplanon.  She has never used birth control. Verblizes that she may be finished having children.. Extra time spent reviewing IUDs, Nexplanon, LARCs. 2)  Pap - ASCCP guidelines and rational discussed.  Patient opts for annual screening interval  3) Patient underwent screening for postpartum depression with no concerns noted.  4) Follow up 1one month for her  6 week PP  physical and to start on her choice of contraception.  Mirna Mires, CNM  12/13/2022 1:46 PM   12/13/2022 1:40 PM

## 2023-01-10 ENCOUNTER — Other Ambulatory Visit (HOSPITAL_COMMUNITY)
Admission: RE | Admit: 2023-01-10 | Discharge: 2023-01-10 | Disposition: A | Discharge status: Home / Self Care | Payer: BC Managed Care – PPO | Source: Ambulatory Visit

## 2023-01-10 ENCOUNTER — Ambulatory Visit (INDEPENDENT_AMBULATORY_CARE_PROVIDER_SITE_OTHER): Payer: BC Managed Care – PPO

## 2023-01-10 VITALS — BP 124/80 | HR 53 | Wt 239.0 lb

## 2023-01-10 DIAGNOSIS — Z124 Encounter for screening for malignant neoplasm of cervix: Secondary | ICD-10-CM | POA: Insufficient documentation

## 2023-01-10 DIAGNOSIS — Z3009 Encounter for other general counseling and advice on contraception: Secondary | ICD-10-CM

## 2023-01-10 DIAGNOSIS — Z3480 Encounter for supervision of other normal pregnancy, unspecified trimester: Secondary | ICD-10-CM

## 2023-01-10 MED ORDER — NORELGESTROMIN-ETH ESTRADIOL 150-35 MCG/24HR TD PTWK
1.0000 | MEDICATED_PATCH | TRANSDERMAL | 12 refills | Status: DC
Start: 2023-01-10 — End: 2023-07-06

## 2023-01-10 NOTE — Progress Notes (Signed)
   Post Partum Exam  Laprincia Railsback is a 27 y.o. G42P2002 female who presents for a postpartum visit. She is 6 weeks postpartum following a low cervical transverse Cesarean section. I have fully reviewed the prenatal and intrapartum course. The delivery was at [redacted]w[redacted]d gestational weeks.  Postpartum course has been normal.   Bleeding no bleeding. Bowel function is normal. Bladder function is normal.  Patient is not sexually active. Contraception method is  unsure but interested in patch .  Postpartum depression screening: has been dealing with depression throughout her pregnancy and continues to see therapist regularly. Not currently on medication.   Baby's course has been normal. Baby is feeding by both breast and bottle.   Review of Systems Pertinent items are noted in HPI.    Objective:  currently breastfeeding.  General:  alert, cooperative, and no distress   Breasts:  deferred  Lungs: clear to auscultation bilaterally  Heart:  regular rate and rhythm, S1, S2 normal, no murmur, click, rub or gallop  Abdomen: soft, non-tender; bowel sounds normal; no masses,  no organomegaly   Vulva:  normal  Vagina: normal vagina, no discharge, exudate, lesion, or erythema  Cervix:  no cervical motion tenderness and no lesions  Rectal Exam: Not performed.        Assessment:   Normal postpartum exam.  Pap smear done at today's visit.   Plan:   1. Contraception:  considering Xulane patch, Rx provided. Reviewed other options available.  2. Declines additional resources at this time for depression. Will follow up if she decides she would like to start medication.  3. Follow up in: 1  year  or as needed.   Lindalou Hose Britiny Defrain, CNM  01/09/2410:12 AM

## 2023-01-15 LAB — CYTOLOGY - PAP
Adequacy: ABSENT
Diagnosis: NEGATIVE

## 2023-03-13 ENCOUNTER — Encounter: Payer: Self-pay | Admitting: Obstetrics and Gynecology

## 2023-06-01 ENCOUNTER — Encounter: Payer: Self-pay | Admitting: Obstetrics and Gynecology

## 2023-07-06 ENCOUNTER — Ambulatory Visit (INDEPENDENT_AMBULATORY_CARE_PROVIDER_SITE_OTHER)

## 2023-07-06 ENCOUNTER — Other Ambulatory Visit (HOSPITAL_COMMUNITY)
Admission: RE | Admit: 2023-07-06 | Discharge: 2023-07-06 | Disposition: A | Discharge status: Home / Self Care | Source: Ambulatory Visit | Attending: Obstetrics and Gynecology | Admitting: Obstetrics and Gynecology

## 2023-07-06 VITALS — BP 125/85 | HR 86 | Resp 16 | Ht 64.0 in | Wt 232.3 lb

## 2023-07-06 DIAGNOSIS — N898 Other specified noninflammatory disorders of vagina: Secondary | ICD-10-CM | POA: Insufficient documentation

## 2023-07-06 NOTE — Patient Instructions (Signed)
 Vaginal Yeast Infection, Adult  Vaginal yeast infection is a condition that causes vaginal discharge as well as soreness, swelling, and redness (inflammation) of the vagina. This is a common condition. Some women get this infection frequently. What are the causes? This condition is caused by a change in the normal balance of the yeast (Candida) and normal bacteria that live in the vagina. This change causes an overgrowth of yeast, which causes the inflammation. What increases the risk? The condition is more likely to develop in women who: Take antibiotic medicines. Have diabetes. Take birth control pills. Are pregnant. Douche often. Have a weak body defense system (immune system). Have been taking steroid medicines for a long time. Frequently wear tight clothing. What are the signs or symptoms? Symptoms of this condition include: White, thick, creamy vaginal discharge. Swelling, itching, redness, and irritation of the vagina. The lips of the vagina (labia) may be affected as well. Pain or a burning feeling while urinating. Pain during sex. How is this diagnosed? This condition is diagnosed based on: Your medical history. A physical exam. A pelvic exam. Your health care provider will examine a sample of your vaginal discharge under a microscope. Your health care provider may send this sample for testing to confirm the diagnosis. How is this treated? This condition is treated with medicine. Medicines may be over-the-counter or prescription. You may be told to use one or more of the following: Medicine that is taken by mouth (orally). Medicine that is applied as a cream (topically). Medicine that is inserted directly into the vagina (suppository). Follow these instructions at home: Take or apply over-the-counter and prescription medicines only as told by your health care provider. Do not use tampons until your health care provider approves. Do not have sex until your infection has  cleared. Sex can prolong or worsen your symptoms of infection. Ask your health care provider when it is safe to resume sexual activity. Keep all follow-up visits. This is important. How is this prevented?  Do not wear tight clothes, such as pantyhose or tight pants. Wear breathable cotton underwear. Do not use douches, perfumed soap, creams, or powders. Wipe from front to back after using the toilet. If you have diabetes, keep your blood sugar levels under control. Ask your health care provider for other ways to prevent yeast infections. Contact a health care provider if: You have a fever. Your symptoms go away and then return. Your symptoms do not get better with treatment. Your symptoms get worse. You have new symptoms. You develop blisters in or around your vagina. You have blood coming from your vagina and it is not your menstrual period. You develop pain in your abdomen. Summary Vaginal yeast infection is a condition that causes discharge as well as soreness, swelling, and redness (inflammation) of the vagina. This condition is treated with medicine. Medicines may be over-the-counter or prescription. Take or apply over-the-counter and prescription medicines only as told by your health care provider. Do not douche. Resume sexual activity or use of tampons as instructed by your health care provider. Contact a health care provider if your symptoms do not get better with treatment or your symptoms go away and then return. This information is not intended to replace advice given to you by your health care provider. Make sure you discuss any questions you have with your health care provider. Document Revised: 06/28/2020 Document Reviewed: 06/28/2020 Elsevier Patient Education  2024 Elsevier Inc.  Vaginal Infection (Bacterial Vaginosis): What to Know  Bacterial vaginosis is  an infection of the vagina. It happens when the balance of normal germs (bacteria) in the vagina changes. If you  don't get treated, it can make it easier for you to get other infections from sex. These are called sexually transmitted infections (STIs). If you're pregnant, you need to get treated right away. This infection can cause a baby to be born early or at a low birth weight. What are the causes? This infection happens when too many harmful germs grow in the vagina. You can't get this infection from toilet seats, bedsheets, swimming pools, or things that touch your vagina. What increases the risk? Having sex with a new person or more than one person. Having sex without protection. Douching. Having an intrauterine device (IUD). Smoking. Using drugs or drinking alcohol. These can lead you to do risky things. Taking certain antibiotics. Being pregnant. What are the signs or symptoms? Some females have no symptoms. Symptoms may include: A gray or white discharge from your vagina. It can be watery or foamy. A fishy smell. This can happen after sex or during your menstrual period. Itching in and around your vagina. Burning or pain when you pee. How is this treated? This infection is treated with antibiotics. These may be given to you as: A pill. A cream for your vagina. A medicine that you put into your vagina (suppository). If the infection comes back, you may need more antibiotics. Follow these instructions at home: Medicines Take your medicines as told. Take or use your antibiotics as told. Do not stop using them even if you start to feel better. General instructions If the person you have sex with is a female, tell her that you have this infection. She will need to follow up with her doctor. Female partners don't need to be treated. Do not have sex until you finish treatment. Drink more fluids as told. Keep your vagina and butt clean. Wash these areas with warm water each day. Wipe from front to back after you poop. If you're breastfeeding a baby, talk to your doctor if you should keep  doing so during treatment. How is this prevented? Self-care Do not douche. Do not use deodorant sprays on your vagina. Wear cotton underwear. Do not wear tight pants and pantyhose, especially in the summer. Safe sex Use condoms the correct way and every time you have sex. Use dental dams to protect yourself during oral sex. Limit how many people you have sex with. Get tested for STIs. The person you have sex with should also get tested. Drugs and alcohol Do not smoke, vape, or use nicotine or tobacco. Do not use drugs. Limit the amount of alcohol you drink because it can lead you to do risky things. Where to find more information To learn more: Go to TonerPromos.no. Click Health Topics A-Z. Type "bacterial vaginosis" in the search bar. American Sexual Health Association (ASHA): ashasexualhealth.org U.S. Department of Health and [employer], Office on Women's Health: TravelLesson.ca Contact a doctor if: Your symptoms don't get better, even after treatment. You have more discharge or pain when you pee. You have a fever or chills. You have pain in your belly or in the area between your hips. You have pain during sex. You bleed from your vagina between menstrual periods. This information is not intended to replace advice given to you by your health care provider. Make sure you discuss any questions you have with your health care provider. Document Revised: 09/27/2022 Document Reviewed: 09/27/2022 Elsevier Patient Education  2024  [employer].

## 2023-07-06 NOTE — Progress Notes (Signed)
    NURSE VISIT NOTE  Subjective:    Patient ID: Brenda Mccoy, female    DOB: 1995-09-13, 28 y.o.   MRN: [id number]  HPI  Patient is a 28 y.o. G1P2002 female who presents for vaginal burning and itching x 1 week, denies vaginal discharge. Denies abnormal vaginal bleeding or significant pelvic pain or fever. denies dysuria, urinary frequency, urinary urgency, flank pain, and pelvic pain. Patient admits to history of known exposure to STD.   Objective:    BP 125/85   Pulse 86   Resp 16   Ht 5\' 4"  (1.626 m)   Wt 232 lb 4.8 oz (105.4 kg)   BMI 39.87 kg/m    @THIS  VISIT ONLY@  Assessment:   1. Vaginal discharge     bacterial vaginosis, rule out GC or chlamydia, and nonspecific vaginitis  Plan:   GC and chlamydia DNA  probe sent to lab. Treatment: abstain from coitus during course of treatment ROV prn if symptoms persist or worsen.   Santiago Bumpers, CMA [facility]

## 2023-07-10 ENCOUNTER — Encounter: Payer: Self-pay | Admitting: Obstetrics and Gynecology

## 2023-07-10 LAB — CERVICOVAGINAL ANCILLARY ONLY
Bacterial Vaginitis (gardnerella): NEGATIVE
Candida Glabrata: NEGATIVE
Candida Vaginitis: NEGATIVE
Chlamydia: NEGATIVE
Comment: NEGATIVE
Comment: NEGATIVE
Comment: NEGATIVE
Comment: NEGATIVE
Comment: NEGATIVE
Comment: NORMAL
Neisseria Gonorrhea: NEGATIVE
Trichomonas: NEGATIVE

## 2024-04-29 NOTE — Progress Notes (Unsigned)
 "   Connell Davies, MD (Inactive)   No chief complaint on file.   HPI:      Ms. Zenaya Ulatowski is a 29 y.o. G2P2002 whose LMP was No LMP recorded., presents today for *** Neg pap 9/24   Patient Active Problem List   Diagnosis Date Noted   Headache in pregnancy 10/08/2022   Supervision of other normal pregnancy, antepartum 05/26/2022   Prediabetes 02/16/2020    Past Surgical History:  Procedure Laterality Date   CESAREAN SECTION  09/05/2020   Procedure: CESAREAN SECTION;  Surgeon: Connell Davies, MD;  Location: [facility] ORS;  Service: Obstetrics;;   CESAREAN SECTION N/A 11/29/2022   Procedure: REPEAT CESAREAN SECTION;  Surgeon: Connell Davies, MD;  Location: [facility] ORS;  Service: Obstetrics;  Laterality: N/A;   ROOT CANAL     WISDOM TOOTH EXTRACTION     1    Family History  Problem Relation Age of Onset   Hypertension Mother    Healthy Father    Other Sister        HPV   Healthy Sister    Healthy Sister    Healthy Sister    Healthy Brother    Healthy Brother    Healthy Brother    Healthy Maternal Grandmother    Hypertension Maternal Grandfather    Cancer Paternal Grandmother    Healthy Paternal Grandmother     Social History   Socioeconomic History   Marital status: Single    Spouse name: Not on file   Number of children: 1   Years of education: 16   Highest education level: Not on file  Occupational History   Occupation: tech at [employer]  Tobacco Use   Smoking status: Never   Smokeless tobacco: Never  Vaping Use   Vaping status: Never Used  Substance and Sexual Activity   Alcohol use: Not Currently   Drug use: Never   Sexual activity: Yes    Partners: Male    Birth control/protection: None  Other Topics Concern   Not on file  Social History Narrative   Not on file   Social Drivers of Health   Tobacco Use: Low Risk (07/06/2023)   Patient History    Smoking Tobacco Use: Never    Smokeless Tobacco Use: Never    Passive Exposure: Not on file  Financial  Resource Strain: Low Risk (05/26/2022)   Overall Financial Resource Strain (CARDIA)    Difficulty of Paying Living Expenses: Not very hard  Food Insecurity: No Food Insecurity (11/29/2022)   Hunger Vital Sign    Worried About Running Out of Food in the Last Year: Never true    Ran Out of Food in the Last Year: Never true  Transportation Needs: No Transportation Needs (11/29/2022)   PRAPARE - Administrator, Civil Service (Medical): No    Lack of Transportation (Non-Medical): No  Physical Activity: Inactive (05/26/2022)   Exercise Vital Sign    Days of Exercise per Week: 0 days    Minutes of Exercise per Session: 0 min  Stress: No Stress Concern Present (05/26/2022)   [employer] of Occupational Health - Occupational Stress Questionnaire    Feeling of Stress : Not at all  Social Connections: Moderately Integrated (05/26/2022)   Social Connection and Isolation Panel    Frequency of Communication with Friends and Family: More than three times a week    Frequency of Social Gatherings with Friends and Family: Once a week    Attends Religious Services: More  than 4 times per year    Active Member of Clubs or Organizations: No    Attends Banker Meetings: Never    Marital Status: Living with partner  Intimate Partner Violence: Not At Risk (11/29/2022)   Humiliation, Afraid, Rape, and Kick questionnaire    Fear of Current or Ex-Partner: No    Emotionally Abused: No    Physically Abused: No    Sexually Abused: No  Depression (PHQ2-9): Not on file  Alcohol Screen: Not on file  Housing: Low Risk (11/29/2022)   Housing    Last Housing Risk Score: 0  Utilities: Not At Risk (11/29/2022)   AHC Utilities    Threatened with loss of utilities: No  Health Literacy: Not on file    Outpatient Medications Prior to Visit  Medication Sig Dispense Refill   acetaminophen  (TYLENOL ) 500 MG tablet Take 2 tablets (1,000 mg total) by mouth every 6 (six) hours as needed. 60 tablet 1    ibuprofen  (ADVIL ) 600 MG tablet Take 1 tablet (600 mg total) by mouth every 6 (six) hours. 60 tablet 1   No facility-administered medications prior to visit.      ROS:  Review of Systems BREAST: No symptoms   OBJECTIVE:   Vitals:  There were no vitals taken for this visit.  Physical Exam  Results: No results found for this or any previous visit (from the past 24 hours).   Assessment/Plan: No diagnosis found.    No orders of the defined types were placed in this encounter.     No follow-ups on file.  Amine Adelson B. Mecca Guitron, PA-C 04/29/2024 11:00 AM      "

## 2024-04-30 ENCOUNTER — Encounter: Payer: Self-pay | Admitting: Obstetrics and Gynecology

## 2024-04-30 ENCOUNTER — Ambulatory Visit: Admitting: Obstetrics and Gynecology

## 2024-04-30 VITALS — BP 105/72 | HR 96 | Ht 64.0 in | Wt 246.0 lb

## 2024-04-30 DIAGNOSIS — N898 Other specified noninflammatory disorders of vagina: Secondary | ICD-10-CM | POA: Diagnosis not present

## 2024-04-30 DIAGNOSIS — R399 Unspecified symptoms and signs involving the genitourinary system: Secondary | ICD-10-CM | POA: Diagnosis not present

## 2024-04-30 DIAGNOSIS — N644 Mastodynia: Secondary | ICD-10-CM

## 2024-04-30 DIAGNOSIS — Z113 Encounter for screening for infections with a predominantly sexual mode of transmission: Secondary | ICD-10-CM

## 2024-04-30 LAB — POCT URINALYSIS DIPSTICK
Bilirubin, UA: NEGATIVE
Blood, UA: NEGATIVE
Glucose, UA: NEGATIVE
Ketones, UA: NEGATIVE
Nitrite, UA: NEGATIVE
Protein, UA: NEGATIVE
Spec Grav, UA: 1.015
pH, UA: 6.5

## 2024-04-30 MED ORDER — NITROFURANTOIN MONOHYD MACRO 100 MG PO CAPS: 100.0000 mg | ORAL_CAPSULE | Freq: Two times a day (BID) | ORAL | 0 refills | Status: AC

## 2024-04-30 NOTE — Patient Instructions (Signed)
 I value your feedback and you entrusting [place] with your care. If you get a [place] patient survey, I would appreciate you taking the time to let us know about your experience today. Thank you! ? ? ?

## 2024-05-01 LAB — SYPHILIS: RPR W/REFLEX TO RPR TITER AND TREPONEMAL ANTIBODIES, TRADITIONAL SCREENING AND DIAGNOSIS ALGORITHM: RPR Ser Ql: NONREACTIVE

## 2024-05-01 LAB — HIV ANTIBODY (ROUTINE TESTING W REFLEX): HIV Screen 4th Generation wRfx: NONREACTIVE

## 2024-05-02 ENCOUNTER — Ambulatory Visit: Payer: Self-pay | Admitting: Obstetrics and Gynecology

## 2024-05-02 LAB — URINE CULTURE

## 2024-05-02 LAB — NUSWAB VAGINITIS PLUS (VG+)
Candida albicans, NAA: NEGATIVE
Candida glabrata, NAA: NEGATIVE
Chlamydia trachomatis, NAA: NEGATIVE
Neisseria gonorrhoeae, NAA: NEGATIVE
Trich vag by NAA: NEGATIVE

## 2024-05-25 ENCOUNTER — Encounter: Payer: Self-pay | Admitting: Obstetrics and Gynecology

## 2024-05-26 NOTE — Telephone Encounter (Signed)
 Is this normal or should she be seen?
